# Patient Record
Sex: Female | Born: 1935 | Race: White | Hispanic: No | Smoking: Never smoker
Health system: Southern US, Community
[De-identification: ages and names within clinical notes are randomized; demographics above are authoritative.]

## PROBLEM LIST (undated history)

## (undated) DIAGNOSIS — M6281 Muscle weakness (generalized): Secondary | ICD-10-CM

## (undated) DIAGNOSIS — M26609 Unspecified temporomandibular joint disorder, unspecified side: Secondary | ICD-10-CM

## (undated) DIAGNOSIS — Z933 Colostomy status: Secondary | ICD-10-CM

## (undated) DIAGNOSIS — H01009 Unspecified blepharitis unspecified eye, unspecified eyelid: Secondary | ICD-10-CM

## (undated) DIAGNOSIS — N6019 Diffuse cystic mastopathy of unspecified breast: Secondary | ICD-10-CM

## (undated) DIAGNOSIS — IMO0002 Reserved for concepts with insufficient information to code with codable children: Secondary | ICD-10-CM

## (undated) DIAGNOSIS — I10 Essential (primary) hypertension: Secondary | ICD-10-CM

## (undated) DIAGNOSIS — K589 Irritable bowel syndrome without diarrhea: Secondary | ICD-10-CM

## (undated) DIAGNOSIS — F329 Major depressive disorder, single episode, unspecified: Secondary | ICD-10-CM

## (undated) DIAGNOSIS — D376 Neoplasm of uncertain behavior of liver, gallbladder and bile ducts: Secondary | ICD-10-CM

## (undated) DIAGNOSIS — R609 Edema, unspecified: Secondary | ICD-10-CM

## (undated) DIAGNOSIS — Z7901 Long term (current) use of anticoagulants: Secondary | ICD-10-CM

## (undated) DIAGNOSIS — E559 Vitamin D deficiency, unspecified: Secondary | ICD-10-CM

## (undated) DIAGNOSIS — D649 Anemia, unspecified: Secondary | ICD-10-CM

## (undated) DIAGNOSIS — N8110 Cystocele, unspecified: Secondary | ICD-10-CM

## (undated) DIAGNOSIS — K921 Melena: Secondary | ICD-10-CM

## (undated) DIAGNOSIS — R32 Unspecified urinary incontinence: Secondary | ICD-10-CM

## (undated) DIAGNOSIS — I2510 Atherosclerotic heart disease of native coronary artery without angina pectoris: Secondary | ICD-10-CM

## (undated) DIAGNOSIS — R259 Unspecified abnormal involuntary movements: Secondary | ICD-10-CM

## (undated) DIAGNOSIS — I80299 Phlebitis and thrombophlebitis of other deep vessels of unspecified lower extremity: Secondary | ICD-10-CM

## (undated) DIAGNOSIS — M1990 Unspecified osteoarthritis, unspecified site: Secondary | ICD-10-CM

## (undated) DIAGNOSIS — F419 Anxiety disorder, unspecified: Secondary | ICD-10-CM

## (undated) DIAGNOSIS — C801 Malignant (primary) neoplasm, unspecified: Secondary | ICD-10-CM

## (undated) DIAGNOSIS — H9190 Unspecified hearing loss, unspecified ear: Secondary | ICD-10-CM

## (undated) DIAGNOSIS — C189 Malignant neoplasm of colon, unspecified: Secondary | ICD-10-CM

## (undated) DIAGNOSIS — E876 Hypokalemia: Secondary | ICD-10-CM

## (undated) DIAGNOSIS — I2699 Other pulmonary embolism without acute cor pulmonale: Secondary | ICD-10-CM

## (undated) HISTORY — PX: CYSTECTOMY: SUR359

## (undated) HISTORY — DX: Irritable bowel syndrome, unspecified: K58.9

## (undated) HISTORY — DX: Malignant (primary) neoplasm, unspecified: C80.1

## (undated) HISTORY — DX: Reserved for concepts with insufficient information to code with codable children: IMO0002

## (undated) HISTORY — DX: Unspecified urinary incontinence: R32

## (undated) HISTORY — DX: Other pulmonary embolism without acute cor pulmonale: I26.99

## (undated) HISTORY — DX: Major depressive disorder, single episode, unspecified: F32.9

## (undated) HISTORY — DX: Phlebitis and thrombophlebitis of other deep vessels of unspecified lower extremity: I80.299

## (undated) HISTORY — DX: Muscle weakness (generalized): M62.81

## (undated) HISTORY — DX: Hypokalemia: E87.6

## (undated) HISTORY — DX: Unspecified blepharitis unspecified eye, unspecified eyelid: H01.009

## (undated) HISTORY — DX: Cystocele, unspecified: N81.10

## (undated) HISTORY — PX: HERNIA REPAIR: SHX51

## (undated) HISTORY — DX: Colostomy status: Z93.3

## (undated) HISTORY — DX: Unspecified abnormal involuntary movements: R25.9

## (undated) HISTORY — DX: Unspecified temporomandibular joint disorder, unspecified side: M26.609

## (undated) HISTORY — DX: Atherosclerotic heart disease of native coronary artery without angina pectoris: I25.10

## (undated) HISTORY — DX: Essential (primary) hypertension: I10

## (undated) HISTORY — DX: Vitamin D deficiency, unspecified: E55.9

## (undated) HISTORY — PX: SKIN CANCER EXCISION: SHX779

## (undated) HISTORY — PX: COLONOSCOPY: SHX174

## (undated) HISTORY — DX: Neoplasm of uncertain behavior of liver, gallbladder and bile ducts: D37.6

## (undated) HISTORY — PX: VAGINAL PROLAPSE REPAIR: SHX830

## (undated) HISTORY — PX: BACK SURGERY: SHX140

## (undated) HISTORY — PX: EYE SURGERY: SHX253

## (undated) HISTORY — DX: Anemia, unspecified: D64.9

## (undated) HISTORY — DX: Malignant neoplasm of colon, unspecified: C18.9

## (undated) HISTORY — PX: APPENDECTOMY: SHX54

## (undated) HISTORY — DX: Diffuse cystic mastopathy of unspecified breast: N60.19

## (undated) HISTORY — DX: Edema, unspecified: R60.9

## (undated) HISTORY — DX: Long term (current) use of anticoagulants: Z79.01

## (undated) HISTORY — DX: Unspecified hearing loss, unspecified ear: H91.90

## (undated) HISTORY — DX: Melena: K92.1

## (undated) HISTORY — DX: Unspecified osteoarthritis, unspecified site: M19.90

---

## 1976-09-07 HISTORY — PX: TOTAL ABDOMINAL HYSTERECTOMY: SHX209

## 1981-09-07 HISTORY — PX: NASAL RECONSTRUCTION WITH SEPTAL REPAIR: SHX5665

## 1999-10-22 ENCOUNTER — Encounter: Payer: Self-pay | Admitting: Gastroenterology

## 1999-10-22 ENCOUNTER — Encounter: Admission: RE | Admit: 1999-10-22 | Discharge: 1999-10-22 | Payer: Self-pay | Admitting: Gastroenterology

## 2000-11-24 ENCOUNTER — Encounter: Payer: Self-pay | Admitting: Gastroenterology

## 2000-11-24 ENCOUNTER — Encounter: Admission: RE | Admit: 2000-11-24 | Discharge: 2000-11-24 | Payer: Self-pay | Admitting: Gastroenterology

## 2001-12-07 ENCOUNTER — Encounter: Admission: RE | Admit: 2001-12-07 | Discharge: 2001-12-07 | Payer: Self-pay | Admitting: Gastroenterology

## 2001-12-07 ENCOUNTER — Encounter: Payer: Self-pay | Admitting: Gastroenterology

## 2003-01-17 ENCOUNTER — Encounter: Admission: RE | Admit: 2003-01-17 | Discharge: 2003-01-17 | Payer: Self-pay | Admitting: Gastroenterology

## 2003-01-17 ENCOUNTER — Encounter: Payer: Self-pay | Admitting: Gastroenterology

## 2004-01-12 ENCOUNTER — Encounter: Admission: RE | Admit: 2004-01-12 | Discharge: 2004-01-12 | Payer: Self-pay | Admitting: Gastroenterology

## 2004-03-05 ENCOUNTER — Encounter: Admission: RE | Admit: 2004-03-05 | Discharge: 2004-03-05 | Payer: Self-pay | Admitting: Gastroenterology

## 2004-10-21 ENCOUNTER — Encounter (INDEPENDENT_AMBULATORY_CARE_PROVIDER_SITE_OTHER): Payer: Self-pay | Admitting: Specialist

## 2004-10-21 ENCOUNTER — Inpatient Hospital Stay (HOSPITAL_COMMUNITY): Admission: RE | Admit: 2004-10-21 | Discharge: 2004-10-24 | Payer: Self-pay | Admitting: Obstetrics and Gynecology

## 2004-12-24 ENCOUNTER — Ambulatory Visit: Payer: Self-pay | Admitting: Internal Medicine

## 2005-01-23 ENCOUNTER — Encounter: Admission: RE | Admit: 2005-01-23 | Discharge: 2005-01-23 | Payer: Self-pay | Admitting: Gastroenterology

## 2005-05-19 ENCOUNTER — Encounter: Admission: RE | Admit: 2005-05-19 | Discharge: 2005-05-19 | Payer: Self-pay | Admitting: Gastroenterology

## 2005-09-07 HISTORY — PX: OTHER SURGICAL HISTORY: SHX169

## 2006-06-13 ENCOUNTER — Encounter: Admission: RE | Admit: 2006-06-13 | Discharge: 2006-06-13 | Payer: Self-pay | Admitting: Orthopedic Surgery

## 2006-06-23 ENCOUNTER — Encounter: Admission: RE | Admit: 2006-06-23 | Discharge: 2006-06-23 | Payer: Self-pay | Admitting: Gastroenterology

## 2007-07-27 ENCOUNTER — Encounter: Admission: RE | Admit: 2007-07-27 | Discharge: 2007-07-27 | Payer: Self-pay | Admitting: Gastroenterology

## 2008-03-23 ENCOUNTER — Encounter: Admission: RE | Admit: 2008-03-23 | Discharge: 2008-03-23 | Payer: Self-pay | Admitting: Gastroenterology

## 2008-04-04 ENCOUNTER — Encounter: Admission: RE | Admit: 2008-04-04 | Discharge: 2008-04-04 | Payer: Self-pay | Admitting: Gastroenterology

## 2008-04-16 ENCOUNTER — Encounter: Admission: RE | Admit: 2008-04-16 | Discharge: 2008-04-16 | Payer: Self-pay | Admitting: Neurosurgery

## 2008-04-19 ENCOUNTER — Encounter: Admission: RE | Admit: 2008-04-19 | Discharge: 2008-04-19 | Payer: Self-pay | Admitting: Neurosurgery

## 2008-05-07 ENCOUNTER — Encounter: Admission: RE | Admit: 2008-05-07 | Discharge: 2008-05-07 | Payer: Self-pay | Admitting: Radiology

## 2008-09-05 ENCOUNTER — Encounter: Admission: RE | Admit: 2008-09-05 | Discharge: 2008-09-05 | Payer: Self-pay | Admitting: Gastroenterology

## 2009-02-18 ENCOUNTER — Encounter: Admission: RE | Admit: 2009-02-18 | Discharge: 2009-02-18 | Payer: Self-pay | Admitting: Gastroenterology

## 2010-04-16 ENCOUNTER — Encounter: Admission: RE | Admit: 2010-04-16 | Discharge: 2010-06-18 | Payer: Self-pay | Admitting: Geriatric Medicine

## 2011-01-23 NOTE — Discharge Summary (Signed)
Erin Hansen, Erin Hansen                 ACCOUNT NO.:  [id number]   MEDICAL RECORD NO.:  [id number]          PATIENT TYPE:  INP   LOCATION:  9306                          FACILITY:  [facility]   PHYSICIAN:  Randye Lobo, M.D.   DATE OF BIRTH:  11-28-1925   DATE OF ADMISSION:  10/21/2004  DATE OF DISCHARGE:  10/24/2004                                 DISCHARGE SUMMARY   ADMISSION DIAGNOSES:  1.  Vaginal vault eversion.  2.  Stress and overflow incontinence.   DISCHARGE DIAGNOSES:  1.  Third degree cystocele.  2.  Second degree rectocele.  3.  Small enterocele.  4.  Genuine stress incontinence with overflow incontinence.  5.  Status post anterior and posterior colporrhaphy, enterocele repair,      tension-free vaginal tape, midurethral sling, and cystoscopy.  6.  Postoperative anemia.   HISTORY OF PRESENT ILLNESS:  The patient is a 77 year old gravida 1, para 1-  0-0-0, Caucasian female, status post total abdominal hysterectomy with  bilateral salpingo-oophorectomy and status post vaginal prolapse repair, who  presented with recurrent vaginal protrusion and urinary incontinence which  was not ameliorated with pessary use.  The patient reported difficulty with  urinary retention along with episodes of loss of urine which limited her  lifestyle significantly.  The patient wished for surgical treatment of the  prolapse and incontinence.   PAST MEDICAL HISTORY:  Significant for:  1.  Mild degenerative joint disease in the neck and left shoulder.  2.  Nocturnal palpitations.  3.  Chronic fatigue.  4.  Intermittent labyrinthitis.  5.  Intermittent bronchitis.  6.  Chronic blepharitis.  7.  Irritable bowel syndrome with constipation and episodes of abdominal      pain.  8.  Chronic muscle contraction headaches.  9.  Hypercystic change of the breasts.  10. Chronic right carpal tunnel syndrome.  11. Intermittent left temporomandibular joint symptoms.  12. Mild resting hand tremor.  13.  Intermittent hoarseness.  14. Diminished memory thought to be probable stress reaction.  15. History of cellulitis.  16. Bilateral tinnitus.  17. History of pain in the left thigh.   PAST SURGICAL HISTORY:  1.  Status post total abdominal hysterectomy with bilateral salpingo-      oophorectomy in 1978.  2.  Status post vaginal prolapse repair in 1990.  3.  Status post herniorrhaphy.  4.  Status post deviated septum surgical repair.   MEDICATIONS:  1.  Lactulose four tablespoons one time per day.  2.  Erythromycin.  3.  Desensitization injections.   ALLERGIES:  CODEINE.   SOCIAL HISTORY:  The patient is married.  She lives at [facility].  Her husband has recently had a car accident and he fractured his sternum.  The patient denies the use of tobacco, alcohol, or drugs that are not  prescribed to her.   PHYSICAL EXAMINATION:  HEART:  S1 and S2 with a regular rate and rhythm and  no evidence of murmurs, rubs, or gallops.  LUNGS:  Clear to auscultation bilaterally.  ABDOMEN:  There is a well-healed Pfannenstiel incision without  evidence of  herniation.  The abdomen is soft and nontender and without evidence of  hepatosplenomegaly or organomegaly.  PELVIC:  Atrophic external genitalia.  Normal urethra.  Evidence of what  appeared to be complete vaginal vault eversion with dried keratotic  epithelium.  The cervix was absent and there was no evidence of any midline  nor adnexal masses.   HOSPITAL COURSE:  The patient was admitted on October 21, 2004, at which  time she underwent a large anterior colporrhaphy with a posterior  colporrhaphy, enterocele repair, tension-free vaginal tape procedure, and  cystoscopy while under general anesthesia.  The patient's surgery was  uncomplicated, but she was noted to have an estimated blood loss of 600 mL.  The patient did have cystoscopy performed during the procedure documenting  the ureters to be patent bilaterally with no evidence  of sutures or foreign  body in the bladder or urethra.   POSTOPERATIVE COURSE:  The patient had really an unremarkable postoperative  recovery.  She initially had a morphine PCA to treat her pain.  She did  develop some nausea overnight following her surgery and eventually she was  converted over to oral Percocet and ibuprofen which have controlled her pain  well.  The patient on postoperative day #1 had minimal ambulation to the  bathroom and back, but by postoperative day #2, she was able to ambulate in  the corridor with assistance and the use of her cane.   The patient's diet was slowly advanced to regular.  She is able to keep down  solid food and liquids at the time of her discharge.   The patient did have bladder training on postoperative day #1, but was not  able to void spontaneously with two attempts and her Foley catheter was  therefore reinserted and left to gravity drainage with a support Velcro band  on her leg to insure that the catheter was not pulling on the urethra and  the surgical repair.   The patient wore PAS stockings and TED hose throughout her hospitalization  for DVT prophylaxis.   With respect to the patient's incision sites, her two suprapubic incision  sites remained intact.  There is some dried blood noted on the incisions and  there is a small amount of ecchymotic change in this region.  She does have  Dermabond on her skin, so the skin does appear to be shiny.  Examination of  the vulva does demonstrate significant ecchymosis and some edema in this  region.  There is no palpable hematoma and this has remained stable  throughout the hospital.   The patient did have a diagnosis of postoperative anemia.  Her hemoglobin at  the time of her discharge is 8.2 and she is tolerating this well without  episodes of shortness of breath, palpitations, chest pain, tachycardia, or dizziness.  The patient has been started on iron sulfate to treat this.   The  patient was found to be in good condition and ready for discharge on  October 24, 2004.  Due to her catheterization and her need for assistance  for ambulation, she will be discharged to [facility] skilled nursing  care initially.   DISCHARGE MEDICATIONS:  1.  Percocet 5 mg/325 mg one p.o. q.4h p.r.n. pain.  2.  Ibuprofen 600 mg p.o. q.6h p.r.n. pain.  3.  Iron sulfate 325 mg p.o. t.i.d. with meals.  4.  Macrobid 100 mg p.o. daily for prophylaxis while her catheter remains in  place.  5.  The patient may take Lactulose four tablespoons p.o. daily.  6.  Metamucil one teaspoon p.o. b.i.d.  7.  She may resume the use of her Erythromycin for her blepharitis as      needed.   DIET:  The patient will follow a regular diet.   WOUND CARE:  The patient will use soap and water to take care of her  surgical sites.  She is to place nothing in the vagina.   DISCHARGE INSTRUCTIONS:  The patient will ambulate several times per day and  sit in a chair at least two to three times per day during her surgical  recovery.  The patient will follow up in the office in one week for a trial  of bladder training.  Her Foley catheter is to be removed two hours before  she arrives to the office for her appointment time.  The patient will call  the office if she experiences any problems with fever, increased vaginal  bleeding, pain uncontrolled by her medications, nausea and vomiting,  dizziness, lightheadedness, or headache indicating intolerance to her  anemia.      BES/MEDQ  D:  10/24/2004  T:  10/24/2004  Job:  [id number]

## 2011-01-23 NOTE — H&P (Signed)
NAMESHEQUITA, PEPLINSKI NO.:  [id number]   MEDICAL RECORD NO.:  [id number]          PATIENT TYPE:  INP   LOCATION:  NA                            [facility]AC[facility]:  [facility]   PHYSICIAN:  Randye Lobo, M.D.   DATE OF BIRTH:  1925-11-27   DATE OF ADMISSION:  DATE OF DISCHARGE:                                HISTORY & PHYSICAL   CHIEF COMPLAINT:  Vaginal protrusion and loss of bladder control.   The patient is a 77 year old gravida 1, para 1-0-0-0 Caucasian female,  status post total abdominal hysterectomy with bilateral salpingo-  oophorectomy for uterine fibroids in 1978 and status post a vaginal prolapse  repair in 1990, who presents now with recurrent vaginal protrusion which has  not been satisfactorily treated with pessary use.  The patient has reported  the use of several different pessaries which have not been able to control  her prolapse.  The patient also reports periods of urinary retention  alternating with loss of control which has significantly limited her  physical and social activity.  The patient would like surgical treatment of  the protrusion and of the urinary incontinence.   PAST OBSTETRIC AND GYNECOLOGIC HISTORY:  1.  Vaginal delivery.  2.  She is status post total abdominal hysterectomy with bilateral salpingo-      oophorectomy for fibroids.  3.  She is status post a vaginal prolapse repair in 1990.  The patient has      been using Premarin 0.625 hormone therapy which was discontinued for her      surgery.  The patient has been using vaginal estrogen cream.  4.  The patient's last Pap smear was performed in December 2005 and was      within normal limits, and her last mammogram was performed in October      2005 and was also within normal limits.   PAST MEDICAL HISTORY:  1.  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  2.  Nocturnal palpitations.  3.  Chronic fatigue.  4.  Intermittent labyrinthitis.  5.  Intermittent  bronchitis.  6.  Chronic blepharitis.  7.  Irritable bowel syndrome with constipation and episodes of abdominal      pain.  8.  Chronic muscle contraction headaches.  9.  Fibrocystic change of the breasts.  10. Chronic right carpal tunnel syndrome.  11. Intermittent left TMJ symptoms.  12. Mild resting head tremor.  13. Intermittent hoarseness.  14. Diminished memory thought to be a probable stress reaction.  15. History of cellulitis.  16. Bilateral tinnitus.  17. History of pain in the left thigh.   PAST SURGICAL HISTORY:  1.  Status post total abdominal hysterectomy with bilateral salpingo-      oophorectomy in 1978.  2.  Status post vaginal prolapse repair in 1990.  3.  Status post herniorrhaphy.  4.  Status post surgical treatment of a deviated septum.   MEDICATIONS:  1.  Celebrex 200 mg p.o. p.r.n.  2.  Premarin 0.625 mg p.o. daily (discontinued).  3.  Lactulose 60 mL b.i.d.  4.  Erythromycin ointment.  5.  Desensitization shots.   ALLERGIES:  CODEINE.   SOCIAL HISTORY:  The patient is married.  She lives at [facility].  Her husband has recently had a car accident and fractured his sternum.  The  patient denies the use of tobacco, alcohol, or drugs that are not prescribed  to her.   FAMILY HISTORY:  Negative for heart disease, hypertension, high cholesterol,  stroke, and diabetes.  Negative for breast, uterine, colon, and ovarian  cancer.   PHYSICAL EXAMINATION:  GENERAL:  The patient is an elderly Caucasian female  in no acute distress.  HEART:  S1, S2 with a regular rate and rhythm.  No evidence of a murmur,  rub, or gallop.  ABDOMEN:  There is evidence of a well-healed Pfannenstiel incision without  evidence of herniation.  The abdomen is soft and nontender and without  evidence of hepatosplenomegaly or organomegaly.  PELVIC EXAM:  Atrophic external genitalia.  Normal urethra.  The patient has  evidence of complete vaginal vault eversion with evidence  of dried keratotic-  appearing epithelium.  The cervix is noted to be absent.  There is no  evidence of any midline nor adnexal masses appreciated.   IMPRESSION:  The patient is a 77 year old female, status post total  abdominal hysterectomy with bilateral salpingo-oophorectomy and status post  vaginal prolapse repair, who presents now with recurrent prolapse and  evidence of complete vaginal vault eversion and a probable large enterocele.  The patient has a significant problem with urinary incontinence which is  probably a combination of both stress incontinence and overflow  incontinence.   PLAN:  The patient will undergo an anterior and posterior colporrhaphy, TVT  with cystoscopy, enterocele repair, and iliococcygeus vaginal vault  suspension at the [facility] on October 21, 2004.  Risks, benefits, and alternatives have been discussed with the patient, who  wishes to proceed.      BES/MEDQ  D:  10/20/2004  T:  10/20/2004  Job:  [id number]

## 2011-01-23 NOTE — Op Note (Signed)
Erin Hansen, Erin Hansen NO.:  [id number]   MEDICAL RECORD NO.:  [id number]          PATIENT TYPE:  INP   LOCATION:  9399                          FACILITY:  [facility]   PHYSICIAN:  Randye Lobo, M.D.   DATE OF BIRTH:  August 31, 1926   DATE OF PROCEDURE:  10/21/2004  DATE OF DISCHARGE:                                 OPERATIVE REPORT   PREOPERATIVE DIAGNOSES:  1.  Vaginal vault eversion.  2.  Overflow and stress incontinence.   POSTOPERATIVE DIAGNOSIS:  1.  Third degree cystocele.  2.  Second degree rectocele.  3.  Overflow and stress incontinence.   PROCEDURE:  Anterior and posterior colporrhaphy, enterocele repair, TVT,  cystoscopy.   SURGEON:  Randye Lobo, M.D.   ASSISTANT:  Luvenia Redden, M.D.   ANESTHESIA:  General endotracheal.   IV FLUIDS:  3100 mL Ringer's lactate.   ESTIMATED BLOOD LOSS:  600 mL.   URINE OUTPUT:  750 mL.   COMPLICATIONS:  None.   INDICATIONS FOR PROCEDURE:  The patient is a 77 year old gravida one, para 1-  0-0-0 Caucasian female, status post total abdominal hysterectomy with  bilateral salpingo-oophorectomy and status post prolapse repair in 1990 who  presented with recurrent vaginal protrusion and urinary incontinence  alternating with urinary retention. The patient has been cared for by Dr.  Elana Alm who has attempted the use of pessaries which have not satisfactorily  reduced the prolapse. The patient presented to me for further evaluation and  treatment and I also tried varying types of pessaries along with vaginal  estrogen cream which did not ameliorate the patient's symptoms. The patient  had an obvious large prolapse which was thought to the vaginal vault  eversion. There was obvious hypermobility of the urethra. An attempt was  made to perform urodynamics in the office, however it was difficult to  reduce the prolapse adequately to even perform this study. The patient did  have leakage of urine with the pessary in  place and she also reported  difficulty at times with urinary retention which she could somewhat relieve  by placing pressure vaginally on her bladder. The patient wished for  surgical treatment of both the vaginal prolapse and the urinary incontinence  and a plan was made to proceed with an anterior and posterior colporrhaphy  with an enterocele repair and a tension-free vaginal tape with  cystoscopy  after risks and benefits were reviewed.   FINDINGS:  Examination under anesthesia revealed an extremely large  cystocele with actually good support of the vaginal apex. There was evidence  of a second degree rectocele. The urethra was noted to have prolapse and  there was hypermobility of the urethra appreciated. There was an enterocele  appreciated at the inferior aspect of the bladder.   Cystoscopy demonstrated the absence of a foreign body in the bladder or in  the urethra after both passing the transabdominal needle passers and after  placing the sling itself. There were no lesions noted in the bladder which  was visualized throughout 360 degrees. The bladder had a normal trigone and  bladder dome. The ureters were patent bilaterally after the injection of  indigo carmine dye. The urethra was noted to be  short.   Rectal examination both during and after completion of the posterior  colporrhaphy demonstrated the absence of sutures in the rectum.   SPECIMENS:  The enterocele sac was sent to pathology.   PROCEDURE:  The patient was reidentified in the preoperative hold area. The  patient did receive Ancef 1 g intravenously for antibiotic prophylaxis. She  had both TED hose and PAS stockings for DVT prophylaxis.   The patient had a doughnut pessary which was in place and was removed. The  suprapubic region and the vagina and perineum were then sterilely prepped  and draped.   A Foley catheter was placed inside the bladder. The patient was then  examined under anesthesia and the  findings are as noted above. Initially two  figure-of-eight sutures of #0 Vicryl were placed at what was thought to be  the vaginal apex bilaterally.  However, with further evaluation, the  significant vaginal prolapse was noted to be essentially all cystocele. The  midline of the vaginal mucosa was marked with Allis clamps and the mucosa  was then injected with 0.5% lidocaine with 1:200,000 of epinephrine. The  vaginal mucosa was then incised vertically in the midline with a scalpel and  with a Mayo scissors. The subvaginal tissue was dissected off of the  overlying vaginal epithelium bilaterally. Small bleeding vessels were  cauterized with monopolar cautery. There was some bleeding vessels noted  over the bladder bilaterally and these responded well to both monopolar  cautery and figure-of-eight sutures of 2-0 Vicryl. The dissection was  carried back to the level of the pubic rami bilaterally.   At this time further evaluation of the enterocele was performed. The  enterocele sac was entered sharply during the anterior vaginal dissection.  The cul-de-sac was examined. There were no bowel adhesions to the  peritoneum. The enterocele sac was mark was marked with hemostat clamps, and  the bladder was then dissected off of the peritoneum. The peritoneum was  noted to be very close to the vaginal epithelium at the most inferior aspect  of the vaginal mucosal incision. A decision was therefore made to close the  enterocele sac with 2-0 Vicryl absorbable suture. The excess enterocele sac  was then sharply excised.   The TVT was performed at this time. The suprapubic incision sites were  marked approximately 2 cm to the right and left to the midline. The TVT  procedure was performed in a top down  method. The needle was placed through  the right suprapubic incision and out through the endopelvic fascia to the right of the urethra and at approximately the level of the mid urethra. The  same  procedure that was performed on the right-hand side was then repeated  on the left with that abdominal passer.   The Foley catheter was removed at this time and cystoscopy was performed and  the findings were as noted above. The TVT sling was then attached to the  abdominal needle passers and was drawn up through the suprapubic incisions  bilaterally. This was performed with the bladder empty. Final cystoscopy was  performed again and the findings are as noted above. The plastic sheath was  removed at this time and the TVT was set in a tension free position. The  excess mesh was trimmed suprapubically. There was some bleeding noted along  the exit site of the abdominal  needle passer at the location on the right  hand side where the mesh came through and this was made hemostatic with  figure-of-eight sutures of 2-0 Vicryl.   Hemostasis was excellent at this time and the anterior colporrhaphy was next  performed by placing a series of vertical mattress sutures of #0 Vicryl.  This adequately reduced the cystocele. The excess vaginal mucosa was then  trimmed and the anterior vaginal wall was closed with a running locked  suture of 2-0 Vicryl.   The posterior colporrhaphy was performed next. Allis clamps were used to  mark the midline of the posterior vagina and the vaginal mucosa was injected  with 0.5% lidocaine with 1:200,000 of epinephrine. A triangular wedge of  epithelium was removed from the perineal body. The vagina was then incised  vertically in the midline with a Mayo scissors. The perirectal fascia was  dissected off of the overlying vaginal epithelium bilaterally. The rectocele  was then reduced by placing a series of both horizontal and then vertical  mattress sutures of #0 Vicryl across the perirectal fascia and levator  muscles. The excess vaginal mucosa was then trimmed and the posterior vagina  was closed with 2-0 Vicryl. The 2-0 Vicryl was used in a running locked  fashion  along the vaginal mucosa to the level of the hymen. A series of 2  crown sutures of #0 Vicryl were placed to provide support to the perineal  body. The remainder of the posterior vaginal closure was performed with the  2-0 Vicryl as for an episiotomy and the knot and the knot was tied inside  the hymen.   Final rectal examination confirmed the absence of any sutures in the rectum.  There was some bleeding noted in a buttonhole of the mucosa of the vagina on  the patient's left-hand side above the hymenal ring. A figure-of-eight  suture of 2-0 Vicryl was placed in this area for excellent hemostasis. A  vaginal packing with Estrace cream was placed inside the vagina.   The suprapubic incisions were closed with Dermabond.   The Foley catheter was left to gravity drainage. The patient was cleansed  with Betadine. She was taken out of the dorsal lithotomy position, awakened,  and extubated.  There were no complications to the procedure. All needle, instrument, and  sponge counts were correct.      BES/MEDQ  D:  10/21/2004  T:  10/21/2004  Job:  [id number]   cc:   S. Kyra Manges, M.D.  [phone] [address]  [place]  [place] [address]  Fax: [phone]

## 2012-08-22 ENCOUNTER — Other Ambulatory Visit: Payer: Self-pay | Admitting: Gastroenterology

## 2012-08-23 ENCOUNTER — Other Ambulatory Visit: Payer: Self-pay | Admitting: Gastroenterology

## 2012-08-23 DIAGNOSIS — C189 Malignant neoplasm of colon, unspecified: Secondary | ICD-10-CM

## 2012-08-26 ENCOUNTER — Ambulatory Visit
Admission: RE | Admit: 2012-08-26 | Discharge: 2012-08-26 | Disposition: A | Discharge status: Home / Self Care | Payer: Medicare Other | Source: Ambulatory Visit | Attending: Gastroenterology | Admitting: Gastroenterology

## 2012-08-26 DIAGNOSIS — C189 Malignant neoplasm of colon, unspecified: Secondary | ICD-10-CM

## 2012-08-26 MED ORDER — IOHEXOL 300 MG/ML  SOLN
100.0000 mL | Freq: Once | INTRAMUSCULAR | Status: AC | PRN
  Administered 2012-08-26: 100 mL via INTRAVENOUS

## 2012-09-06 ENCOUNTER — Encounter (INDEPENDENT_AMBULATORY_CARE_PROVIDER_SITE_OTHER): Payer: Self-pay | Admitting: General Surgery

## 2012-09-09 ENCOUNTER — Encounter (INDEPENDENT_AMBULATORY_CARE_PROVIDER_SITE_OTHER): Payer: Self-pay | Admitting: General Surgery

## 2012-09-09 ENCOUNTER — Ambulatory Visit (INDEPENDENT_AMBULATORY_CARE_PROVIDER_SITE_OTHER): Payer: Medicare Other | Admitting: General Surgery

## 2012-09-09 VITALS — BP 138/70 | HR 76 | Temp 97.8°F | Resp 18 | Ht 67.0 in | Wt 152.1 lb

## 2012-09-09 DIAGNOSIS — C189 Malignant neoplasm of colon, unspecified: Secondary | ICD-10-CM | POA: Insufficient documentation

## 2012-09-09 NOTE — Progress Notes (Signed)
Patient ID: Erin Hansen, female   DOB: 05/08/1926, 79 y.o.   MRN: [id number]  Chief Complaint  Patient presents with  . Colon Cancer    Sigmoid    HPI Erin Hansen is a 79 y.o. female.  Referred by Dr. Outlaw  HPI This is an 79-year-old female with a history of some irritable bowel syndrome. Since August she has noticed some generalized abdominal discomfort as well as some weakness. She had had diarrhea in August she treated with Imodium. She also noted that she was nauseated and that she was sleeping a lot more. About 2 months ago she also noted some hematochezia. She was a evaluated by her primary care physician and then was referred to see gastroenterology. She also describes leaking some blood and mucus. She is wearing a diaper now due to the fact that she has considerable leakage and is not really able to control her bowel movements or leakage of blood or mucus from her anus right now. This is keeping her basically in her room at her assisted living facility at [facility]. She prior to this is a very active lady who drove herself to her ct scan recently.She has used lactulose for about 20 years and over the last week she did use some lactulose with a return of a bowel movement. She is not having normal bowel movements at all right now although it does complain of some increased bloating and abdominal issues. She has lost 15 pounds since all this began but she described no change in her appetite. She was evaluated by Dr. Outlaw and underwent a colonoscopy. The colonoscopy showed a few medium-sized diverticuli in the sigmoid colon and the descending colon. There was a partially obstructing ulcerated mass found in the sigmoid colon. This involved about two thirds of the lumen. This was injected with India ink. A pediatric and an upper endoscope were both used to try to reach the cecum unsuccessfully. Dr. Outlaw was able to get about to the mid transverse colon and was able to traverse the lesion.  She underwent a biopsy of this area which shows this to be positive for adenocarcinoma. Since then she has undergone a CT of her chest which shows no evidence of any metastatic disease and history of her T12 compression fracture. A CT of her abdomen and pelvis shows a lobular soft tissue mass in the rectosigmoid region measuring 5.6 x 3 x 3.8 cm with no definitive evidence of local extension or adenopathy. There is a moderate amount of feces proximal to this as well. There is also an 11 mm low-attenuation structure in the mid right lobe of the liver that does not appear to be a cyst as well as a small soft tissue nodule in the right upper quadrant posterior to the liver which could potentially be a peritoneal implant. She comes in today to discuss her options.  Past Medical History  Diagnosis Date  . DJD (degenerative joint disease)   . Compression fracture     t12  . IBS (irritable bowel syndrome)   . Vaginal prolapse   . TMJ (temporomandibular joint disorder)   . Anemia   . Cancer     skin  . Hearing loss   . Cough   . Constipation   . Weakness   . Rectal bleeding   . Blood in stool     Past Surgical History  Procedure Date  . Appendectomy   . Total abdominal hysterectomy   . Vaginal prolapse   repair 2005 - approximate  . Cystectomy     back  . Pelvic fracture surgery     reconstruction  . Nasal reconstruction with septal repair   . Skin cancer excision   . Hernia repair 1950 - approximate    History reviewed. No pertinent family history.  Social History History  Substance Use Topics  . Smoking status: Never Smoker   . Smokeless tobacco: Not on file  . Alcohol Use: No    Allergies  Allergen Reactions  . Codeine Nausea Only    Current Outpatient Prescriptions  Medication Sig Dispense Refill  . acetaminophen (TYLENOL) 500 MG tablet Take 500 mg by mouth every 6 (six) hours as needed.      . calcium carbonate (TUMS - DOSED IN MG ELEMENTAL CALCIUM) 500 MG chewable  tablet Chew 1 tablet by mouth daily.      . cholecalciferol (VITAMIN D) 1000 UNITS tablet Take 1,000 Units by mouth daily.      . docusate sodium (COLACE) 100 MG capsule Take 100 mg by mouth as needed.       . ferrous sulfate 325 (65 FE) MG tablet Take 325 mg by mouth daily with breakfast.      . hydrochlorothiazide (HYDRODIURIL) 25 MG tablet Take 25 mg by mouth daily.      . lactulose (CEPHULAC) 10 G packet Take 10 g by mouth 3 (three) times daily.      . psyllium (METAMUCIL) 58.6 % powder Take 1 packet by mouth 3 (three) times daily.      . pyridOXINE (VITAMIN B-6) 100 MG tablet Take 100 mg by mouth daily.      . sertraline (ZOLOFT) 25 MG tablet Take 25 mg by mouth daily. Patient takes 1/2 tablet per dosage.      . vitamin C (ASCORBIC ACID) 500 MG tablet Take 500 mg by mouth daily.        Review of Systems Review of Systems  Constitutional: Negative for fever, chills and unexpected weight change.  HENT: Positive for hearing loss. Negative for congestion, sore throat, trouble swallowing and voice change.   Eyes: Negative for visual disturbance.  Respiratory: Positive for cough. Negative for wheezing.   Cardiovascular: Negative for chest pain, palpitations and leg swelling.  Gastrointestinal: Positive for constipation and blood in stool. Negative for nausea, vomiting, abdominal pain, diarrhea, abdominal distention and anal bleeding.  Genitourinary: Negative for hematuria, vaginal bleeding and difficulty urinating.  Musculoskeletal: Negative for arthralgias.  Skin: Negative for rash and wound.  Neurological: Positive for weakness. Negative for seizures, syncope and headaches.  Hematological: Negative for adenopathy. Does not bruise/bleed easily.  Psychiatric/Behavioral: Negative for confusion.    Blood pressure 138/70, pulse 76, temperature 97.8 F (36.6 C), temperature source Temporal, resp. rate 18, height 5' 7" (1.702 m), weight 152 lb 2 oz (69.003 kg).  Physical Exam Physical Exam    Vitals reviewed. Constitutional: She appears well-developed and well-nourished.  Eyes: No scleral icterus.  Neck: Neck supple.  Cardiovascular: Normal rate, regular rhythm and normal heart sounds.   Pulmonary/Chest: Effort normal and breath sounds normal. She has no wheezes. She has no rales.  Abdominal: Soft. Bowel sounds are normal. There is no tenderness. No hernia. Hernia confirmed negative in the ventral area, confirmed negative in the right inguinal area and confirmed negative in the left inguinal area.    Genitourinary: Rectal exam shows external hemorrhoid and tenderness. Guaiac positive stool (gross blood seen).  Lymphadenopathy:    She has no cervical adenopathy.      Data Reviewed CT CHEST, ABDOMEN AND PELVIS WITH CONTRAST  Technique: Multidetector CT imaging of the chest, abdomen and  pelvis was performed following the standard protocol during bolus  administration of intravenous contrast.  Contrast: 100mL OMNIPAQUE IOHEXOL 300 MG/ML SOLN,  Comparison: None.  CT CHEST  Findings: On the lung window images, no lung metastasis are seen.  No infiltrate or effusion is noted.  On soft tissue window images, the thyroid gland is somewhat  asymmetric right lobe larger than left but no suspicious nodule or  mass is seen. The thoracic aorta opacifies with no acute  abnormality and the origins of the great vessels are patent. The  pulmonary arteries opacify with no acute abnormality noted. There  is mild cardiomegaly noted. A small hiatal hernia is present. No  acute bony abnormality is seen and no lytic or blastic lesion is  noted throughout the thoracic spine. There is a compression  deformity of T12 vertebral body with apparent vertebroplasty at  that level and some retropulsion.  IMPRESSION:  1. No evidence of metastatic involvement of the lungs.  2. Small hiatal hernia.  3. Compressed T12 vertebral body with vertebroplasty and  retropulsion at that level.  CT ABDOMEN AND  PELVIS  Findings: Within the right lobe of liver laterally there is an 11  mm low attenuation poorly defined structure present. This is not a  cyst and this area appears to persist on delayed images. A hepatic  metastatic lesion cannot be excluded. A tiny sub centimeter  structure is noted in the anterior aspect of the medial segment of  the left lobe of questionable significance. No ductal dilatation  is seen. No calcified gallstones are noted. Within the right upper  quadrant posterior to the liver and abutting the hemidiaphragm,  there is a 6 x 12 mm soft tissue nodule present. A peritoneal  metastatic implant cannot be excluded. The pancreas is normal in  size and the pancreatic duct is not dilated. The adrenal glands  and spleen are unremarkable although there are several calcified  splenic granulomas present most likely due to prior granulomatous  disease. The stomach is fluid distended with no abnormality noted.  The kidneys enhance and there do appear to be small nonobstructing  left renal calculi present. The abdominal aorta is normal in  caliber with moderate atheromatous change present. No adenopathy  is seen.  The urinary bladder is unremarkable. The uterus appears to have  previously been resected. No adnexal lesion is seen.  However, within the left pelvis there is an abnormal soft tissue  mass which appears to involve the rectosigmoid colon, measuring  approximately 5.6 x 3.0 x 3.8 cm, consistent with the recently  diagnosed carcinoma. No definite evidence of local extension of  tumor is seen. No adenopathy is noted. There is a moderate amount  of feces throughout the more proximal colon. The terminal ileum is  unremarkable. Diffuse degenerative disc disease is present  throughout the lumbar spine.  IMPRESSION:  1. Lobular soft tissue mass in the rectosigmoid colon consistent  with the recently diagnosed rectosigmoid colon carcinoma. No  definite evidence of local  extension of tumor is seen and no  adenopathy is noted.  2. 11 mm low attenuation structure in the mid right lobe of liver.  Cannot exclude a single liver metastasis. A tiny sub centimeter  low attenuation structure in the left lobe is also noted of  uncertain significance.  3. Small soft tissue nodule in the right upper quadrant posterior    to the liver. Cannot exclude peritoneal implant of tumor.  Original Report Authenticated By: Paul Barry, M.D.   Assessment    Colon cancer    Plan    We spent about 45 minutes discussing her new diagnosis. She does have a symptomatic sigmoid colon cancer. She does have bleeding and I think it is causing her some obstructive symptoms. I am concerned that she also has stage IV disease given her CT scan as well as some of her symptoms. I discussed with her proceeding with a biopsy of the liver lesion by interventional radiology and an evaluation by medical oncology next week. I think she will still need surgery and I discussed with her a laparoscopic possible open sigmoid colectomy. I think due to the size of her tumor she is going to have a incision regardless just to remove this. We discussed for a prolonged time the impact this will have on her current health status as well as her living status. I don't know that she is going to return to her functional level after this all either. Am also concerned with her ability to control her bowel movements and her leakage she currently has. When I examined her today to do a rectal exam she has some significant external hemorrhoids as well as a diaper that was full of stool, blood, and mucus. She right now cannot really leave her room [facility] due to that consistent leakage she has. I told her that I think the best option for her given her diagnosis as well as her quality of life afterwards would be a colostomy. I we had a long discussion about her ability to do things in participate outside of her room with a  colostomy. She initially was concerned about this but after long discussion at the she is agreeable to this. I will plan on a colectomy with a colostomy in 2 weeks after the other 2 things have been done. I will plan on seeing her again later next week prior to proceeding with surgery I discussed this with she and her friend who was present and is a nurse. He seems satisfied with this and will plan to see her next week prior to doing surgery. We had a long discussion about the risks of surgery including bleeding, infection, open wound, hernia, DVT, PE, cardiac complications, abscess, reoperation, as well as the long-term effects this is have       Erin Hansen 09/09/2012, 3:35 PM    

## 2012-09-12 ENCOUNTER — Encounter (HOSPITAL_COMMUNITY): Payer: Self-pay | Admitting: Pharmacy Technician

## 2012-09-12 ENCOUNTER — Telehealth (INDEPENDENT_AMBULATORY_CARE_PROVIDER_SITE_OTHER): Payer: Self-pay

## 2012-09-12 ENCOUNTER — Telehealth: Payer: Self-pay | Admitting: *Deleted

## 2012-09-12 DIAGNOSIS — K6389 Other specified diseases of intestine: Secondary | ICD-10-CM

## 2012-09-12 NOTE — Telephone Encounter (Signed)
Called Radiology to check on status of the CT bx for the liver mets and the order from Friday had been canceled. I spoke to Dr Dwain Sarna and he did not cancel the order so I did another order for the bx in epic.

## 2012-09-12 NOTE — Telephone Encounter (Signed)
Called Helmut Muster to let her know the order has been changed in epic.

## 2012-09-12 NOTE — Telephone Encounter (Signed)
Spoke with patient by phone and explained role of nurse navigator.  Appointment given to patient for 09/16/12 with Dr. Truett Perna.  She confirmed appointment date and time.  Contact names and phone numbers were given to patient for this RN and Dr. Truett Perna.  She verbalized comprehension.

## 2012-09-12 NOTE — Telephone Encounter (Signed)
[facility] Radiologists calling wanting to switch the order in epic from CT Bx to a U/s Bx on the liver met per their request. I changed the order.

## 2012-09-13 ENCOUNTER — Other Ambulatory Visit: Payer: Self-pay | Admitting: Radiology

## 2012-09-13 ENCOUNTER — Encounter (HOSPITAL_COMMUNITY): Payer: Self-pay | Admitting: Pharmacy Technician

## 2012-09-13 ENCOUNTER — Other Ambulatory Visit (INDEPENDENT_AMBULATORY_CARE_PROVIDER_SITE_OTHER): Payer: Self-pay | Admitting: General Surgery

## 2012-09-13 ENCOUNTER — Telehealth: Payer: Self-pay | Admitting: Oncology

## 2012-09-13 ENCOUNTER — Encounter (INDEPENDENT_AMBULATORY_CARE_PROVIDER_SITE_OTHER): Payer: Self-pay

## 2012-09-13 NOTE — Progress Notes (Signed)
Dr. Dwain Sarna,          Erin Hansen has preop appt at [facility] Thursday  1/9  At 11:00 am - please enter preop orders in Epic.                     Thanks!

## 2012-09-13 NOTE — Telephone Encounter (Signed)
Welcome packet mailed w/calendar.  °

## 2012-09-14 ENCOUNTER — Ambulatory Visit (HOSPITAL_COMMUNITY)
Admission: RE | Admit: 2012-09-14 | Discharge: 2012-09-14 | Disposition: A | Discharge status: Home / Self Care | Payer: Medicare Other | Source: Ambulatory Visit | Attending: General Surgery | Admitting: General Surgery

## 2012-09-14 ENCOUNTER — Encounter (HOSPITAL_COMMUNITY): Payer: Self-pay

## 2012-09-14 DIAGNOSIS — K7689 Other specified diseases of liver: Secondary | ICD-10-CM | POA: Insufficient documentation

## 2012-09-14 DIAGNOSIS — K6389 Other specified diseases of intestine: Secondary | ICD-10-CM

## 2012-09-14 LAB — CBC
HCT: 32.6 % — ABNORMAL LOW (ref 36.0–46.0)
MCV: 87.2 fL (ref 78.0–100.0)
Platelets: 299 10*3/uL (ref 150–400)
RBC: 3.74 MIL/uL — ABNORMAL LOW (ref 3.87–5.11)
WBC: 8 10*3/uL (ref 4.0–10.5)

## 2012-09-14 MED ORDER — MIDAZOLAM HCL 2 MG/2ML IJ SOLN
INTRAMUSCULAR | Status: AC
  Filled 2012-09-14: qty 4

## 2012-09-14 MED ORDER — FENTANYL CITRATE 0.05 MG/ML IJ SOLN
INTRAMUSCULAR | Status: AC
  Filled 2012-09-14: qty 4

## 2012-09-14 MED ORDER — FENTANYL CITRATE 0.05 MG/ML IJ SOLN
INTRAMUSCULAR | Status: AC | PRN
  Administered 2012-09-14: 50 ug via INTRAVENOUS

## 2012-09-14 MED ORDER — SODIUM CHLORIDE 0.9 % IV SOLN: Freq: Once | INTRAVENOUS | Status: DC

## 2012-09-14 MED ORDER — MIDAZOLAM HCL 2 MG/2ML IJ SOLN
INTRAMUSCULAR | Status: AC | PRN
  Administered 2012-09-14: 1 mg via INTRAVENOUS

## 2012-09-14 NOTE — H&P (Signed)
Erin Hansen is an 79 y.o. female.   Chief Complaint: recent colon cancer diagnosis Liver lesion noted on CT Scheduled now for liver lesion biopsy HPI: Colon Ca; hx skin ca; T12 fx; IBS  Past Medical History  Diagnosis Date  . DJD (degenerative joint disease)   . Compression fracture     t12  . IBS (irritable bowel syndrome)   . Vaginal prolapse   . TMJ (temporomandibular joint disorder)   . Anemia   . Cancer     skin  . Hearing loss   . Cough   . Constipation   . Weakness   . Rectal bleeding   . Blood in stool     Past Surgical History  Procedure Date  . Appendectomy   . Total abdominal hysterectomy   . Vaginal prolapse repair 2005 - approximate  . Cystectomy     back  . Pelvic fracture surgery     reconstruction  . Nasal reconstruction with septal repair   . Skin cancer excision   . Hernia repair 1950 - approximate    No family history on file. Social History:  reports that she has never smoked. She does not have any smokeless tobacco history on file. She reports that she does not drink alcohol or use illicit drugs.  Allergies:  Allergies  Allergen Reactions  . Codeine Nausea Only     (Not in a hospital admission)  No results found for this or any previous visit (from the past 48 hour(s)). No results found.  Review of Systems  Constitutional: Negative for fever and chills.  Respiratory: Negative for shortness of breath.   Cardiovascular: Negative for chest pain.  Gastrointestinal: Positive for abdominal pain. Negative for nausea and vomiting.  Neurological: Positive for weakness. Negative for dizziness and headaches.    Blood pressure 149/49, pulse 70, temperature 97.5 F (36.4 C), temperature source Oral, resp. rate 20, height 5\' 7"  (1.702 m), weight 153 lb (69.4 kg), SpO2 98.00%. Physical Exam  Constitutional: She is oriented to person, place, and time. She appears well-developed and well-nourished.  Cardiovascular: Normal rate, regular rhythm  and normal heart sounds.   No murmur heard. Respiratory: Effort normal and breath sounds normal. She has no wheezes.  GI: Soft. Bowel sounds are normal. There is no tenderness.  Musculoskeletal: Normal range of motion.       Uses walker/cane for support  Neurological: She is alert and oriented to person, place, and time.  Psychiatric: She has a normal mood and affect. Her behavior is normal. Judgment and thought content normal.     Assessment/Plan Recent dx colon ca Liver lesion Scheduled now for liver lesion bx Pt aware of procedure benefits and risks and agreeable to proceed Consent signed and in chart  Zander Ingham A 09/14/2012, 9:33 AM

## 2012-09-14 NOTE — H&P (Signed)
Agree 

## 2012-09-14 NOTE — Procedures (Signed)
Procedure:  Ultrasound guided liver biopsy Findings:  Right lobe liver lesion visible by ultrasound and sampled by 18 G core biopsy x 3 via 17 G needle.  No complications.

## 2012-09-15 ENCOUNTER — Encounter (HOSPITAL_COMMUNITY)
Admission: RE | Admit: 2012-09-15 | Discharge: 2012-09-15 | Disposition: A | Discharge status: Home / Self Care | Payer: Medicare Other | Source: Ambulatory Visit | Attending: General Surgery | Admitting: General Surgery

## 2012-09-15 ENCOUNTER — Other Ambulatory Visit (INDEPENDENT_AMBULATORY_CARE_PROVIDER_SITE_OTHER): Payer: Self-pay | Admitting: General Surgery

## 2012-09-15 ENCOUNTER — Ambulatory Visit (HOSPITAL_COMMUNITY)
Admission: RE | Admit: 2012-09-15 | Discharge: 2012-09-15 | Disposition: A | Discharge status: Home / Self Care | Payer: Medicare Other | Source: Ambulatory Visit | Attending: General Surgery | Admitting: General Surgery

## 2012-09-15 ENCOUNTER — Encounter (HOSPITAL_COMMUNITY): Payer: Self-pay

## 2012-09-15 DIAGNOSIS — M8448XA Pathological fracture, other site, initial encounter for fracture: Secondary | ICD-10-CM | POA: Insufficient documentation

## 2012-09-15 DIAGNOSIS — C189 Malignant neoplasm of colon, unspecified: Secondary | ICD-10-CM | POA: Insufficient documentation

## 2012-09-15 DIAGNOSIS — J984 Other disorders of lung: Secondary | ICD-10-CM | POA: Insufficient documentation

## 2012-09-15 DIAGNOSIS — Z01812 Encounter for preprocedural laboratory examination: Secondary | ICD-10-CM | POA: Insufficient documentation

## 2012-09-15 DIAGNOSIS — Z01818 Encounter for other preprocedural examination: Secondary | ICD-10-CM | POA: Insufficient documentation

## 2012-09-15 HISTORY — DX: Anxiety disorder, unspecified: F41.9

## 2012-09-15 LAB — CBC WITH DIFFERENTIAL/PLATELET
Eosinophils Relative: 0 % (ref 0–5)
HCT: 30.2 % — ABNORMAL LOW (ref 36.0–46.0)
Lymphocytes Relative: 21 % (ref 12–46)
Lymphs Abs: 2.3 10*3/uL (ref 0.7–4.0)
MCV: 86.5 fL (ref 78.0–100.0)
Neutro Abs: 7.5 10*3/uL (ref 1.7–7.7)
Platelets: 328 10*3/uL (ref 150–400)
RBC: 3.49 MIL/uL — ABNORMAL LOW (ref 3.87–5.11)
WBC: 10.7 10*3/uL — ABNORMAL HIGH (ref 4.0–10.5)

## 2012-09-15 LAB — COMPREHENSIVE METABOLIC PANEL
ALT: 50 U/L — ABNORMAL HIGH (ref 0–35)
Albumin: 3.1 g/dL — ABNORMAL LOW (ref 3.5–5.2)
Alkaline Phosphatase: 84 U/L (ref 39–117)
Potassium: 3 mEq/L — ABNORMAL LOW (ref 3.5–5.1)
Sodium: 138 mEq/L (ref 135–145)
Total Protein: 6.3 g/dL (ref 6.0–8.3)

## 2012-09-15 LAB — SURGICAL PCR SCREEN
MRSA, PCR: NEGATIVE
Staphylococcus aureus: NEGATIVE

## 2012-09-15 LAB — CEA: CEA: 2.8 ng/mL (ref 0.0–5.0)

## 2012-09-15 MED ORDER — METRONIDAZOLE IVPB CUSTOM: 500.0000 mg | Freq: Once | INTRAVENOUS | Status: DC

## 2012-09-15 NOTE — Progress Notes (Signed)
SCIP colon procedures: Ancef plus flagyl. Ancef only is ordered.

## 2012-09-15 NOTE — Patient Instructions (Signed)
[facility]  09/15/2012   Your procedure is scheduled on: 09/20/12  Report to [facility] at  [phone].  Call this number if you have problems the morning of surgery [phone]   Remember:   Do not eat food or drink liquids After Midnight.     Take these medicines the morning of surgery with A SIP OF WATER: sertraline   Do not wear jewelry, make-up or nail polish.  Do not wear lotions, powders, or perfumes. You may wear deodorant.  Do not shave 48 hours prior to surgery. Men may shave face and neck.  Do not bring valuables to the hospital.  Contacts, dentures or bridgework may not be worn into surgery.  Leave suitcase in the car. After surgery it may be brought to your room.  For patients admitted to the hospital, checkout time is 11:00 AM the day of discharge.      Please read over the following fact sheets that you were given: MRSA Information, blood fact sheet Birdie Sons, RN  pre op nurse call if needed [phone]    FAILURE TO FOLLOW THESE INSTRUCTIONS MAY RESULT IN CANCELLATION OF YOUR SURGERY   Patient Signature: ___________________________________________

## 2012-09-15 NOTE — Consult Note (Signed)
WOC ostomy consult  Preoperative stoma site selection for possible LLQ colostomy. Surgery date:  09/20/12 Patient observed in the sitting and standing positions.  No abdominal surgeries, no scars.  Loose flaccid abdomen.  Site marked in LLQ,  4cm to left of umbilicus and 2.5cm below.  Mark covered with thin film transparent dressing.   Education provided: patient understands that ostomy nurse is her post operative resource if a stoma is required next week in surgery.  Voices that her only concern at this time is that she will be in pain postoperatively.  Assured that nursing staff will be responsive to her comfort needs and that she need only voice her discomfort and request pain medication for it to be addressed.  Expressed gratitude and understanding.  Declined teaching materials at this time stating that "it might not be needed." I will follow should patient receive a diversion.  Please re-consult if stoma constructed. Thanks, Ladona Mow, MSN, RN, [facility], CWOCN [phone])

## 2012-09-15 NOTE — Progress Notes (Signed)
Chest x-ray 09/11/11 on chart will repeat because out of date, office visit Dr. Pete Glatter 03/16/12 on chart, EKG 03/16/12 on chart, Ct chest 08/26/12 on chart, CT abdomen and pelvis 08/26/12 on chart

## 2012-09-16 ENCOUNTER — Ambulatory Visit: Payer: Medicare Other

## 2012-09-16 ENCOUNTER — Ambulatory Visit (HOSPITAL_BASED_OUTPATIENT_CLINIC_OR_DEPARTMENT_OTHER): Payer: Medicare Other | Admitting: Oncology

## 2012-09-16 ENCOUNTER — Telehealth: Payer: Self-pay | Admitting: Oncology

## 2012-09-16 VITALS — BP 134/64 | HR 81 | Temp 96.9°F | Resp 18 | Ht 67.0 in | Wt 154.9 lb

## 2012-09-16 DIAGNOSIS — C187 Malignant neoplasm of sigmoid colon: Secondary | ICD-10-CM

## 2012-09-16 DIAGNOSIS — D649 Anemia, unspecified: Secondary | ICD-10-CM

## 2012-09-16 DIAGNOSIS — C189 Malignant neoplasm of colon, unspecified: Secondary | ICD-10-CM

## 2012-09-16 DIAGNOSIS — E876 Hypokalemia: Secondary | ICD-10-CM

## 2012-09-16 MED ORDER — ACETAMINOPHEN 325 MG PO TABS
650.0000 mg | ORAL_TABLET | Freq: Once | ORAL | Status: AC
  Administered 2012-09-16: 650 mg via ORAL

## 2012-09-16 MED ORDER — POTASSIUM CHLORIDE CRYS ER 20 MEQ PO TBCR: 20.0000 meq | EXTENDED_RELEASE_TABLET | Freq: Every day | ORAL | Status: DC

## 2012-09-16 NOTE — Progress Notes (Signed)
Met with patient and her friend.  Explained role of nurse navigator.  Educational information provided on colorectal cancer.  Patient scheduled for surgery next week.  Will continue to follow for needs.

## 2012-09-16 NOTE — Telephone Encounter (Signed)
Gave pt appt for MD only on February 2014

## 2012-09-16 NOTE — Progress Notes (Signed)
[facility] New Patient Consult   Referring MD: Nisa Decaire 79 y.o.  Aug 19, 1926    Reason for Referral: Colon cancer     HPI: Beginning in August of this year she noted intermittent nausea, abdominal discomfort, and lethargy. She also reports diarrhea and intermittent blood per rectum. She was referred to Dr. Dulce Sellar and underwent a flexible sigmoidoscopy on 08/22/2012. A fungating mass was noted in the sigmoid colon. The mass was partially obstructing. Biopsies were obtained and the pathology confirmed adenocarcinoma.  She was referred for staging CTs of the chest, abdomen, and pelvis on 08/26/2012. There was no evidence for metastatic disease in the lungs. An 11 mm low-attenuation lesion was noted in the right liver that did not appear to be a cyst. A 6 x 12 mm soft tissue nodule was noted in the right upper quadrant posterior to the liver and a peritoneal implants could not be excluded. No adenopathy. Abnormal soft tissue mass in the left pelvis no evidence of local extension of tumor. No adenopathy.  She saw Dr. Dwain Sarna and was referred for an ultrasound guided biopsy of the liver lesion on 09/14/2012. The pathology revealed no malignancy.  She discussed surgical options with Dr. Dwain Sarna and is scheduled for a colectomy and colostomy on 09/21/2011.  Past Medical History  Diagnosis Date  . DJD (degenerative joint disease)   . Compression fracture     t12  . IBS (irritable bowel syndrome)   . Vaginal prolapse   . TMJ (temporomandibular joint disorder)   . Anemia   .  sigmoid colon cancer   08/22/2012   .  G3 P3                              Past Surgical History  Procedure Date  . Appendectomy   . Vaginal prolapse repair 2005 - approximate  . Cystectomy     back  . Pelvic fracture surgery     reconstruction  . Nasal reconstruction with septal repair   . Skin cancer excision   . Arthroscopy left knee 2007  . Colonoscopy   08/22/2012   . Total abdominal hysterectomy 1978  . Hernia repair 1980 - approximate  . Eye surgery [phone]    cataracts bilateral  . Back surgery     mid back   Family history: No family history of colon, rectal, or uterine cancer. One son died at age 52 of sarcoma. Another son died at age 73 of a brain tumor.  Current outpatient prescriptions:acetaminophen (TYLENOL) 500 MG tablet, Take 1,000 mg by mouth every 4 (four) hours as needed. For pain, Disp: , Rfl: ;  cholecalciferol (VITAMIN D) 1000 UNITS tablet, Take 1,000 Units by mouth every morning., Disp: , Rfl: ;  docusate sodium (COLACE) 100 MG capsule, Take 400 mg by mouth at bedtime as needed. For constipation, Disp: , Rfl:  erythromycin ophthalmic ointment, Place 1 application into both eyes at bedtime., Disp: , Rfl: ;  ferrous sulfate 325 (65 FE) MG tablet, Take 325 mg by mouth 2 (two) times a week. Takes on Wednesdays and Sundays., Disp: , Rfl: ;  fish oil-omega-3 fatty acids 1000 MG capsule, Take 1 g by mouth daily., Disp: , Rfl: ;  hydrochlorothiazide (HYDRODIURIL) 25 MG tablet, Take 25 mg by mouth every morning. , Disp: , Rfl:  lactulose (CHRONULAC) 10 GM/15ML solution, Take 40 g by mouth at bedtime., Disp: , Rfl: ;  Melatonin 5 MG TABS, Take 5 mg by mouth at bedtime as needed. For sleep, Disp: , Rfl: ;  Probiotic Product (PROBIOTIC DAILY PO), Take 1 tablet by mouth every morning., Disp: , Rfl: ;  psyllium (METAMUCIL) 58.6 % powder, Take 1 packet by mouth at bedtime. Takes following lactulose., Disp: , Rfl:  pyridOXINE (VITAMIN B-6) 100 MG tablet, Take 100 mg by mouth every morning. , Disp: , Rfl: ;  sertraline (ZOLOFT) 50 MG tablet, Take 75 mg by mouth every morning., Disp: , Rfl: ;  vitamin C (ASCORBIC ACID) 500 MG tablet, Take 500 mg by mouth every morning. , Disp: , Rfl:   Allergies:  Allergies  Allergen Reactions  . Codeine Nausea Only    Social History: She lives in the independent living area of [facility]. She is a widow.  She previously worked in a business occupation. She does not use tobacco or alcohol. No transfusion history.   Positives include: 20 pound weight loss, occasional cough/knees, intermittent diarrhea and rectal bleeding, dull discomfort in the suprapubic area, soreness with a pleuritic discomfort at the right costal margin following a liver biopsy-improved  A complete ROS was otherwise negative.  Physical Exam:  Blood pressure 134/64, pulse 81, temperature 96.9 F (36.1 C), temperature source Oral, resp. rate 18, height 5\' 7"  (1.702 m), weight 154 lb 14.4 oz (70.262 kg).  HEENT: Oropharynx without visible mass, neck without mass Lungs: Clear bilaterally Cardiac: Regular rate and rhythm Abdomen: No hepatomegaly, no mass, mild tenderness at the right lateral costal margin with a small ecchymosis at the liver biopsy site  Vascular: No leg edema Lymph nodes: No cervical, supraclavicular, axillary, or inguinal nodes Neurologic: Alert and oriented, the motor exam appears grossly intact in the upper and lower extremities Skin: No rash   LAB:  CBC  Lab Results  Component Value Date   WBC 10.7* 09/15/2012   HGB 10.0* 09/15/2012   HCT 30.2* 09/15/2012   MCV 86.5 09/15/2012   PLT 328 09/15/2012     CMP      Component Value Date/Time   NA 138 09/15/2012 1220   K 3.0* 09/15/2012 1220   CL 99 09/15/2012 1220   CO2 30 09/15/2012 1220   GLUCOSE 115* 09/15/2012 1220   BUN 20 09/15/2012 1220   CREATININE 0.71 09/15/2012 1220   CALCIUM 9.2 09/15/2012 1220   PROT 6.3 09/15/2012 1220   ALBUMIN 3.1* 09/15/2012 1220   AST 54* 09/15/2012 1220   ALT 50* 09/15/2012 1220   ALKPHOS 84 09/15/2012 1220   BILITOT 0.5 09/15/2012 1220   GFRNONAA 76* 09/15/2012 1220   GFRAA 89* 09/15/2012 1220     Radiology: As per history of present illness    Assessment/Plan:   1. Adenocarcinoma of the sigmoid colon 2. Diarrhea/rectal bleeding secondary to the sigmoid colon tumor 3. Indeterminate liver lesion and peritoneal nodule on a CT  08/26/2012-status post an ultrasound-guided biopsy of the liver lesion on 09/14/2012 with no malignancy found 4. Anemia -- likely secondary to GI blood loss 5. Hypokalemia on labs 09/15/2012-likely secondary to diarrhea and HCTZ, we will attempt to contact her and begin potassium replacement    Disposition:   She has been diagnosed with colon cancer. I discussed the standard treatment approach for patients with colon cancer. She understands surgery is usually performed first and we decide on the indication for adjuvant therapy based on the final surgical pathology.  It is not clear whether she has a localized colon cancer or metastatic disease.  It would be unusual to have a single liver lesion and peritoneal implant. A CEA has not been obtained.  I plan to discuss the case with Dr. Dwain Sarna prior to surgery. We were unable to a CEA to the 09/15/2012 lab.  I will discuss the indication for proceeding with a standard colectomy/anastomosis versus colostomy procedure with Dr. Dwain Sarna. Dr. Dwain Sarna may be able to assess the liver intraoperatively.  [name] will return for an office visit in approximately 3 weeks. We will review the surgical pathology report and decide on the indication for adjuvant therapy. She appears to be in excellent general health for her age and will be a candidate for adjuvant Xeloda if indicated. Erin Hansen 09/16/2012, 7:08 PM

## 2012-09-19 ENCOUNTER — Ambulatory Visit (INDEPENDENT_AMBULATORY_CARE_PROVIDER_SITE_OTHER): Payer: Medicare Other | Admitting: General Surgery

## 2012-09-19 ENCOUNTER — Encounter (INDEPENDENT_AMBULATORY_CARE_PROVIDER_SITE_OTHER): Payer: Self-pay | Admitting: General Surgery

## 2012-09-19 ENCOUNTER — Telehealth (INDEPENDENT_AMBULATORY_CARE_PROVIDER_SITE_OTHER): Payer: Self-pay

## 2012-09-19 VITALS — BP 130/66 | HR 74 | Temp 97.1°F | Resp 18 | Ht 67.0 in | Wt 152.2 lb

## 2012-09-19 DIAGNOSIS — C189 Malignant neoplasm of colon, unspecified: Secondary | ICD-10-CM

## 2012-09-19 NOTE — Telephone Encounter (Signed)
Called pt and asked if she would come in this afternoon to speak with Dr Dwain Sarna again before her surgery tomorrow. The pt will come in this afternoon at 4:15.

## 2012-09-19 NOTE — Progress Notes (Signed)
Subjective:     Patient ID: Erin Hansen, female   DOB: 1925/12/06, 79 y.o.   MRN: [id number]  HPI  79 year old female I saw last week in the new diagnosis of a sigmoid colon cancer. She since then has undergone a biopsy of the liver lesion which is benign. She has also been seen by Dr. Truett Perna of medical oncology. She comes back in today the day prior to her surgical for surgical plan. Review of Systems     Objective:   Physical Exam deferred    Assessment:     Colon cancer    Plan:    We had about a 25 minute discussion today in conjunction with her friend about the surgical plan for tomorrow. Initially I told her I would recommend a laparoscopic assisted sigmoid colectomy with an end colostomy. In our last visit we noted a lot of leakage from her anus and she stated that she was leaking a lot of mucus, stool, and blood from her anus and she was not able to really control this. She is in a diaper in her assisted living facility does not really leave her room. her quality of life due to that has not been very good. I am concerned and remained concerned about giving her anastomosis and her ability to control this. I think that there is a good chance she may still end up in a diaper not able to leave her room at all. I recommend her previously a colostomy and she is now talked to several people and is willing to proceed with that. I had her come back today to discuss a primary anastomosis and the risks involved with that. I offered to perform an anastomosis but after a long discussion with her friend we have decided to proceed with a colostomy still.

## 2012-09-20 ENCOUNTER — Encounter (HOSPITAL_COMMUNITY)
Admission: RE | Disposition: A | Discharge status: Skilled Nursing Facility | Payer: Self-pay | Source: Ambulatory Visit | Attending: General Surgery

## 2012-09-20 ENCOUNTER — Encounter (HOSPITAL_COMMUNITY): Payer: Self-pay | Admitting: Anesthesiology

## 2012-09-20 ENCOUNTER — Inpatient Hospital Stay (HOSPITAL_COMMUNITY)
Admission: RE | Admit: 2012-09-20 | Discharge: 2012-09-27 | DRG: 330 | Disposition: A | Discharge status: Skilled Nursing Facility | Payer: Medicare Other | Source: Ambulatory Visit | Attending: General Surgery | Admitting: General Surgery

## 2012-09-20 ENCOUNTER — Encounter (HOSPITAL_COMMUNITY): Payer: Self-pay | Admitting: *Deleted

## 2012-09-20 ENCOUNTER — Inpatient Hospital Stay (HOSPITAL_COMMUNITY): Payer: Medicare Other | Admitting: Anesthesiology

## 2012-09-20 DIAGNOSIS — M26609 Unspecified temporomandibular joint disorder, unspecified side: Secondary | ICD-10-CM | POA: Diagnosis present

## 2012-09-20 DIAGNOSIS — D62 Acute posthemorrhagic anemia: Secondary | ICD-10-CM | POA: Diagnosis not present

## 2012-09-20 DIAGNOSIS — D500 Iron deficiency anemia secondary to blood loss (chronic): Secondary | ICD-10-CM | POA: Diagnosis present

## 2012-09-20 DIAGNOSIS — Z79899 Other long term (current) drug therapy: Secondary | ICD-10-CM

## 2012-09-20 DIAGNOSIS — C187 Malignant neoplasm of sigmoid colon: Principal | ICD-10-CM | POA: Diagnosis present

## 2012-09-20 DIAGNOSIS — H9190 Unspecified hearing loss, unspecified ear: Secondary | ICD-10-CM | POA: Diagnosis present

## 2012-09-20 DIAGNOSIS — E876 Hypokalemia: Secondary | ICD-10-CM

## 2012-09-20 DIAGNOSIS — C189 Malignant neoplasm of colon, unspecified: Secondary | ICD-10-CM

## 2012-09-20 HISTORY — PX: COLOSTOMY: SHX63

## 2012-09-20 HISTORY — PX: LAPAROSCOPIC SIGMOID COLECTOMY: SHX5928

## 2012-09-20 LAB — POCT I-STAT 4, (NA,K, GLUC, HGB,HCT)
Glucose, Bld: 135 mg/dL — ABNORMAL HIGH (ref 70–99)
HCT: 29 % — ABNORMAL LOW (ref 36.0–46.0)
Hemoglobin: 9.9 g/dL — ABNORMAL LOW (ref 12.0–15.0)

## 2012-09-20 LAB — TYPE AND SCREEN: ABO/RH(D): A POS

## 2012-09-20 LAB — BASIC METABOLIC PANEL
BUN: 16 mg/dL (ref 6–23)
Calcium: 8.4 mg/dL (ref 8.4–10.5)
GFR calc Af Amer: >90 mL/min (ref >90)
GFR calc non Af Amer: 86 mL/min — ABNORMAL LOW (ref >90)
Glucose, Bld: 156 mg/dL — ABNORMAL HIGH (ref 70–99)
Potassium: 3.2 mEq/L — ABNORMAL LOW (ref 3.5–5.1)

## 2012-09-20 LAB — CBC
Hemoglobin: 8.2 g/dL — ABNORMAL LOW (ref 12.0–15.0)
MCH: 29.4 pg (ref 26.0–34.0)
MCHC: 33.2 g/dL (ref 30.0–36.0)
Platelets: 274 10*3/uL (ref 150–400)
RDW: 13.6 % (ref 11.5–15.5)

## 2012-09-20 SURGERY — COLECTOMY, SIGMOID, LAPAROSCOPIC
Anesthesia: General | Site: Abdomen | Wound class: Clean Contaminated

## 2012-09-20 MED ORDER — SODIUM CHLORIDE 0.9 % IJ SOLN: 9.0000 mL | INTRAMUSCULAR | Status: DC | PRN

## 2012-09-20 MED ORDER — PANTOPRAZOLE SODIUM 40 MG IV SOLR
40.0000 mg | Freq: Every day | INTRAVENOUS | Status: DC
  Administered 2012-09-20 – 2012-09-25 (×6): 40 mg via INTRAVENOUS
  Filled 2012-09-20 (×8): qty 40

## 2012-09-20 MED ORDER — METRONIDAZOLE IN NACL 5-0.79 MG/ML-% IV SOLN
500.0000 mg | Freq: Once | INTRAVENOUS | Status: DC
  Filled 2012-09-20: qty 100

## 2012-09-20 MED ORDER — ONDANSETRON HCL 4 MG/2ML IJ SOLN: 4.0000 mg | Freq: Four times a day (QID) | INTRAMUSCULAR | Status: DC | PRN

## 2012-09-20 MED ORDER — MORPHINE SULFATE (PF) 1 MG/ML IV SOLN
INTRAVENOUS | Status: DC
  Administered 2012-09-20: 13:00:00 via INTRAVENOUS
  Administered 2012-09-21: 4 mg via INTRAVENOUS
  Administered 2012-09-21: 1 mg via INTRAVENOUS
  Administered 2012-09-21: 5 mg via INTRAVENOUS
  Administered 2012-09-21: 4 mg via INTRAVENOUS
  Administered 2012-09-21: 2 mg via INTRAVENOUS
  Administered 2012-09-21: 18:00:00 via INTRAVENOUS
  Administered 2012-09-21: 7 mg via INTRAVENOUS
  Administered 2012-09-22 (×2): 6 mg via INTRAVENOUS
  Administered 2012-09-22 – 2012-09-23 (×3): 2 mg via INTRAVENOUS
  Administered 2012-09-23 (×2): 1 mg via INTRAVENOUS
  Administered 2012-09-23: 2 mg via INTRAVENOUS
  Administered 2012-09-23: 08:00:00 via INTRAVENOUS
  Administered 2012-09-23: 6 mg via INTRAVENOUS
  Administered 2012-09-24: 2 mg via INTRAVENOUS
  Administered 2012-09-24: 1 mg via INTRAVENOUS
  Administered 2012-09-24: 0.99 mg via INTRAVENOUS
  Filled 2012-09-20 (×2): qty 25

## 2012-09-20 MED ORDER — SODIUM CHLORIDE 0.9 % IV SOLN
INTRAVENOUS | Status: DC
  Administered 2012-09-20: 16:00:00 via INTRAVENOUS

## 2012-09-20 MED ORDER — PROPOFOL 10 MG/ML IV BOLUS
INTRAVENOUS | Status: DC | PRN
  Administered 2012-09-20: 200 mg via INTRAVENOUS

## 2012-09-20 MED ORDER — SUFENTANIL CITRATE 50 MCG/ML IV SOLN
INTRAVENOUS | Status: DC | PRN
  Administered 2012-09-20: 10 ug via INTRAVENOUS
  Administered 2012-09-20: 15 ug via INTRAVENOUS
  Administered 2012-09-20: 10 ug via INTRAVENOUS
  Administered 2012-09-20: 15 ug via INTRAVENOUS
  Administered 2012-09-20 (×4): 10 ug via INTRAVENOUS

## 2012-09-20 MED ORDER — 0.9 % SODIUM CHLORIDE (POUR BTL) OPTIME
TOPICAL | Status: DC | PRN
  Administered 2012-09-20 (×2): 1000 mL

## 2012-09-20 MED ORDER — NEOSTIGMINE METHYLSULFATE 1 MG/ML IJ SOLN
INTRAMUSCULAR | Status: DC | PRN
  Administered 2012-09-20: 4 mg via INTRAVENOUS

## 2012-09-20 MED ORDER — GLYCOPYRROLATE 0.2 MG/ML IJ SOLN
INTRAMUSCULAR | Status: DC | PRN
  Administered 2012-09-20: 0.6 mg via INTRAVENOUS

## 2012-09-20 MED ORDER — ACETAMINOPHEN 325 MG PO TABS: 650.0000 mg | ORAL_TABLET | Freq: Four times a day (QID) | ORAL | Status: DC | PRN

## 2012-09-20 MED ORDER — CEFAZOLIN SODIUM-DEXTROSE 2-3 GM-% IV SOLR
2.0000 g | INTRAVENOUS | Status: AC
  Administered 2012-09-20 (×2): 2 g via INTRAVENOUS

## 2012-09-20 MED ORDER — LACTATED RINGERS IV SOLN: INTRAVENOUS | Status: DC

## 2012-09-20 MED ORDER — CEFAZOLIN SODIUM 1-5 GM-% IV SOLN
1.0000 g | Freq: Three times a day (TID) | INTRAVENOUS | Status: AC
  Administered 2012-09-20 – 2012-09-21 (×2): 1 g via INTRAVENOUS
  Filled 2012-09-20 (×3): qty 50

## 2012-09-20 MED ORDER — LACTATED RINGERS IV SOLN
INTRAVENOUS | Status: DC | PRN
  Administered 2012-09-20: 1000 mL

## 2012-09-20 MED ORDER — HEPARIN SODIUM (PORCINE) 5000 UNIT/ML IJ SOLN
5000.0000 [IU] | Freq: Three times a day (TID) | INTRAMUSCULAR | Status: DC
  Administered 2012-09-20 – 2012-09-27 (×20): 5000 [IU] via SUBCUTANEOUS
  Filled 2012-09-20 (×25): qty 1

## 2012-09-20 MED ORDER — LACTATED RINGERS IV SOLN
INTRAVENOUS | Status: DC | PRN
  Administered 2012-09-20 (×3): via INTRAVENOUS

## 2012-09-20 MED ORDER — METRONIDAZOLE IN NACL 5-0.79 MG/ML-% IV SOLN
INTRAVENOUS | Status: DC | PRN
  Administered 2012-09-20: 500 g via INTRAVENOUS

## 2012-09-20 MED ORDER — HYDROMORPHONE HCL PF 1 MG/ML IJ SOLN
INTRAMUSCULAR | Status: AC
  Administered 2012-09-20: 14:00:00
  Filled 2012-09-20: qty 1

## 2012-09-20 MED ORDER — POTASSIUM CHLORIDE 10 MEQ/100ML IV SOLN
10.0000 meq | INTRAVENOUS | Status: AC
  Administered 2012-09-20 (×4): 10 meq via INTRAVENOUS
  Filled 2012-09-20 (×4): qty 100
  Filled 2012-09-20: qty 400

## 2012-09-20 MED ORDER — LABETALOL HCL 5 MG/ML IV SOLN
INTRAVENOUS | Status: DC | PRN
  Administered 2012-09-20: 2.5 mg via INTRAVENOUS
  Administered 2012-09-20: 5 mg via INTRAVENOUS
  Administered 2012-09-20: 2.5 mg via INTRAVENOUS

## 2012-09-20 MED ORDER — BUPIVACAINE-EPINEPHRINE 0.25% -1:200000 IJ SOLN
INTRAMUSCULAR | Status: DC | PRN
  Administered 2012-09-20: 15 mL

## 2012-09-20 MED ORDER — DIPHENHYDRAMINE HCL 12.5 MG/5ML PO ELIX: 12.5000 mg | ORAL_SOLUTION | Freq: Four times a day (QID) | ORAL | Status: DC | PRN

## 2012-09-20 MED ORDER — ONDANSETRON HCL 4 MG/2ML IJ SOLN
INTRAMUSCULAR | Status: DC | PRN
  Administered 2012-09-20: 4 mg via INTRAVENOUS

## 2012-09-20 MED ORDER — HYDROMORPHONE HCL PF 1 MG/ML IJ SOLN
0.2500 mg | INTRAMUSCULAR | Status: DC | PRN
  Administered 2012-09-20 (×4): 0.5 mg via INTRAVENOUS

## 2012-09-20 MED ORDER — HETASTARCH-ELECTROLYTES 6 % IV SOLN
INTRAVENOUS | Status: DC | PRN
  Administered 2012-09-20: 11:00:00 via INTRAVENOUS

## 2012-09-20 MED ORDER — ERYTHROMYCIN 5 MG/GM OP OINT
1.0000 "application " | TOPICAL_OINTMENT | Freq: Every day | OPHTHALMIC | Status: DC
  Administered 2012-09-20 – 2012-09-23 (×4): 1 via OPHTHALMIC
  Filled 2012-09-20 (×2): qty 3.5

## 2012-09-20 MED ORDER — DIPHENHYDRAMINE HCL 50 MG/ML IJ SOLN: 12.5000 mg | Freq: Four times a day (QID) | INTRAMUSCULAR | Status: DC | PRN

## 2012-09-20 MED ORDER — NALOXONE HCL 0.4 MG/ML IJ SOLN: 0.4000 mg | INTRAMUSCULAR | Status: DC | PRN

## 2012-09-20 MED ORDER — ACETAMINOPHEN 650 MG RE SUPP: 650.0000 mg | Freq: Four times a day (QID) | RECTAL | Status: DC | PRN

## 2012-09-20 MED ORDER — HEPARIN SODIUM (PORCINE) 5000 UNIT/ML IJ SOLN
5000.0000 [IU] | Freq: Once | INTRAMUSCULAR | Status: AC
  Administered 2012-09-20: 5000 [IU] via SUBCUTANEOUS
  Filled 2012-09-20: qty 1

## 2012-09-20 MED ORDER — CEFAZOLIN SODIUM-DEXTROSE 2-3 GM-% IV SOLR
2.0000 g | INTRAVENOUS | Status: DC
  Administered 2012-09-20: 2 g via INTRAVENOUS

## 2012-09-20 MED ORDER — SERTRALINE HCL 50 MG PO TABS
75.0000 mg | ORAL_TABLET | Freq: Every morning | ORAL | Status: DC
  Administered 2012-09-21 – 2012-09-27 (×7): 75 mg via ORAL
  Filled 2012-09-20 (×7): qty 1

## 2012-09-20 MED ORDER — ROCURONIUM BROMIDE 100 MG/10ML IV SOLN
INTRAVENOUS | Status: DC | PRN
  Administered 2012-09-20: 10 mg via INTRAVENOUS
  Administered 2012-09-20: 50 mg via INTRAVENOUS

## 2012-09-20 MED ORDER — LIDOCAINE HCL (CARDIAC) 20 MG/ML IV SOLN
INTRAVENOUS | Status: DC | PRN
  Administered 2012-09-20: 100 mg via INTRAVENOUS

## 2012-09-20 MED ORDER — DEXAMETHASONE SODIUM PHOSPHATE 10 MG/ML IJ SOLN
INTRAMUSCULAR | Status: DC | PRN
  Administered 2012-09-20: 10 mg via INTRAVENOUS

## 2012-09-20 MED ORDER — BIOTENE DRY MOUTH MT LIQD
15.0000 mL | Freq: Two times a day (BID) | OROMUCOSAL | Status: DC
  Administered 2012-09-20 – 2012-09-21 (×3): 15 mL via OROMUCOSAL

## 2012-09-20 MED ORDER — METRONIDAZOLE IN NACL 5-0.79 MG/ML-% IV SOLN
500.0000 mg | Freq: Three times a day (TID) | INTRAVENOUS | Status: AC
  Administered 2012-09-20 – 2012-09-21 (×2): 500 mg via INTRAVENOUS
  Filled 2012-09-20 (×3): qty 100

## 2012-09-20 SURGICAL SUPPLY — 105 items
ADH SKN CLS APL DERMABOND .7 (GAUZE/BANDAGES/DRESSINGS) ×1
APPLICATOR COTTON TIP 6IN STRL (MISCELLANEOUS) IMPLANT
APPLIER CLIP 5 13 M/L LIGAMAX5 (MISCELLANEOUS)
APR CLP MED LRG 5 ANG JAW (MISCELLANEOUS)
BLADE EXTENDED COATED 6.5IN (ELECTRODE) ×2 IMPLANT
BLADE HEX COATED 2.75 (ELECTRODE) ×2 IMPLANT
BLADE SURG SZ10 CARB STEEL (BLADE) ×4 IMPLANT
CABLE HIGH FREQUENCY MONO STRZ (ELECTRODE) ×2 IMPLANT
CANISTER SUCTION 2500CC (MISCELLANEOUS) ×2 IMPLANT
CELLS DAT CNTRL 66122 CELL SVR (MISCELLANEOUS) IMPLANT
CHLORAPREP W/TINT 26ML (MISCELLANEOUS) ×2 IMPLANT
CLIP APPLIE 5 13 M/L LIGAMAX5 (MISCELLANEOUS) IMPLANT
CLIP TI LARGE 6 (CLIP) IMPLANT
CLOTH BEACON ORANGE TIMEOUT ST (SAFETY) ×2 IMPLANT
CONNECTOR 5 IN 1 STRAIGHT STRL (MISCELLANEOUS) IMPLANT
COVER MAYO STAND STRL (DRAPES) ×2 IMPLANT
COVER SURGICAL LIGHT HANDLE (MISCELLANEOUS) IMPLANT
CUTTER FLEX LINEAR 45M (STAPLE) ×2 IMPLANT
DECANTER SPIKE VIAL GLASS SM (MISCELLANEOUS) IMPLANT
DERMABOND ADVANCED (GAUZE/BANDAGES/DRESSINGS) ×1
DERMABOND ADVANCED .7 DNX12 (GAUZE/BANDAGES/DRESSINGS) ×1 IMPLANT
DEVICE TROCAR PUNCTURE CLOSURE (ENDOMECHANICALS) ×2 IMPLANT
DRAIN CHANNEL 19F RND (DRAIN) IMPLANT
DRAPE LAPAROSCOPIC ABDOMINAL (DRAPES) ×2 IMPLANT
DRAPE LG THREE QUARTER DISP (DRAPES) IMPLANT
DRAPE WARM FLUID 44X44 (DRAPE) ×2 IMPLANT
DRSG TEGADERM 4X4.75 (GAUZE/BANDAGES/DRESSINGS) ×2 IMPLANT
DRSG TEGADERM 6X8 (GAUZE/BANDAGES/DRESSINGS) IMPLANT
DRSG TELFA PLUS 4X6 ADH ISLAND (GAUZE/BANDAGES/DRESSINGS) IMPLANT
ELECT REM PT RETURN 9FT ADLT (ELECTROSURGICAL) ×2
ELECTRODE REM PT RTRN 9FT ADLT (ELECTROSURGICAL) ×1 IMPLANT
ENSEAL DEVICE STD TIP 35CM (ENDOMECHANICALS) IMPLANT
EVACUATOR SILICONE 100CC (DRAIN) IMPLANT
GLOVE BIO SURGEON STRL SZ7 (GLOVE) ×2 IMPLANT
GLOVE BIOGEL PI IND STRL 7.0 (GLOVE) ×1 IMPLANT
GLOVE BIOGEL PI IND STRL 7.5 (GLOVE) ×1 IMPLANT
GLOVE BIOGEL PI INDICATOR 7.0 (GLOVE) ×1
GLOVE BIOGEL PI INDICATOR 7.5 (GLOVE) ×1
GLOVE ECLIPSE 6.5 STRL STRAW (GLOVE) ×2 IMPLANT
GLOVE SS BIOGEL STRL SZ 7 (GLOVE) ×3 IMPLANT
GLOVE SUPERSENSE BIOGEL SZ 7 (GLOVE) ×3
GOWN STRL NON-REIN LRG LVL3 (GOWN DISPOSABLE) ×4 IMPLANT
GOWN STRL REIN XL XLG (GOWN DISPOSABLE) ×8 IMPLANT
HAND ACTIVATED (MISCELLANEOUS) IMPLANT
KIT BASIN OR (CUSTOM PROCEDURE TRAY) ×2 IMPLANT
LEGGING LITHOTOMY PAIR STRL (DRAPES) ×2 IMPLANT
LIGASURE IMPACT 36 18CM CVD LR (INSTRUMENTS) IMPLANT
NS IRRIG 1000ML POUR BTL (IV SOLUTION) ×4 IMPLANT
PACK GENERAL/GYN (CUSTOM PROCEDURE TRAY) IMPLANT
PENCIL BUTTON HOLSTER BLD 10FT (ELECTRODE) ×2 IMPLANT
RELOAD 45 VASCULAR/THIN (ENDOMECHANICALS) ×2 IMPLANT
RELOAD BLUE (STAPLE) ×4 IMPLANT
RTRCTR WOUND ALEXIS 18CM MED (MISCELLANEOUS)
SCALPEL HARMONIC ACE (MISCELLANEOUS) ×2 IMPLANT
SCISSORS LAP 5X35 DISP (ENDOMECHANICALS) IMPLANT
SET IRRIG TUBING LAPAROSCOPIC (IRRIGATION / IRRIGATOR) ×2 IMPLANT
SOLUTION ANTI FOG 6CC (MISCELLANEOUS) ×2 IMPLANT
SPONGE DRAIN TRACH 4X4 STRL 2S (GAUZE/BANDAGES/DRESSINGS) IMPLANT
SPONGE GAUZE 4X4 12PLY (GAUZE/BANDAGES/DRESSINGS) IMPLANT
SPONGE LAP 18X18 X RAY DECT (DISPOSABLE) IMPLANT
STAPLE ECHEON FLEX 60 POW ENDO (STAPLE) ×2 IMPLANT
STAPLER VISISTAT 35W (STAPLE) ×2 IMPLANT
STRIP CLOSURE SKIN 1/2X4 (GAUZE/BANDAGES/DRESSINGS) ×4 IMPLANT
SUCTION POOLE TIP (SUCTIONS) ×2 IMPLANT
SUT MNCRL AB 4-0 PS2 18 (SUTURE) ×2 IMPLANT
SUT MON AB 4-0 SH 27 (SUTURE) IMPLANT
SUT NOV 1 T60/GS (SUTURE) IMPLANT
SUT NOVA T20/GS 25 (SUTURE) IMPLANT
SUT PDS AB 1 CT1 27 (SUTURE) IMPLANT
SUT PDS AB 1 CTX 36 (SUTURE) IMPLANT
SUT PDS AB 1 TP1 54 (SUTURE) IMPLANT
SUT PDS AB 4-0 SH 27 (SUTURE) IMPLANT
SUT PROLENE 2 0 KS (SUTURE) IMPLANT
SUT SILK 2 0 (SUTURE) ×2
SUT SILK 2 0 SH CR/8 (SUTURE) ×2 IMPLANT
SUT SILK 2 0SH CR/8 30 (SUTURE) IMPLANT
SUT SILK 2-0 18XBRD TIE 12 (SUTURE) ×1 IMPLANT
SUT SILK 2-0 30XBRD TIE 12 (SUTURE) IMPLANT
SUT SILK 3 0 (SUTURE) ×2
SUT SILK 3 0 SH CR/8 (SUTURE) ×2 IMPLANT
SUT SILK 3-0 18XBRD TIE 12 (SUTURE) ×1 IMPLANT
SUT VIC AB 2-0 CT1 27 (SUTURE)
SUT VIC AB 2-0 CT1 27XBRD (SUTURE) IMPLANT
SUT VIC AB 2-0 SH 27 (SUTURE) ×1
SUT VIC AB 2-0 SH 27X BRD (SUTURE) ×1 IMPLANT
SUT VIC AB 3-0 PS2 18 (SUTURE)
SUT VIC AB 3-0 PS2 18XBRD (SUTURE) IMPLANT
SUT VIC AB 3-0 SH 27 (SUTURE) ×1
SUT VIC AB 3-0 SH 27XBRD (SUTURE) ×1 IMPLANT
SUT VIC AB 3-0 SH 8-18 (SUTURE) ×4 IMPLANT
SUT VIC AB 4-0 SH 18 (SUTURE) IMPLANT
SUT VICRYL 0 UR6 27IN ABS (SUTURE) IMPLANT
SYR BULB IRRIGATION 50ML (SYRINGE) ×2 IMPLANT
SYS LAPSCP GELPORT 120MM (MISCELLANEOUS)
SYSTEM LAPSCP GELPORT 120MM (MISCELLANEOUS) IMPLANT
TOWEL OR 17X26 10 PK STRL BLUE (TOWEL DISPOSABLE) ×4 IMPLANT
TRAY FOLEY CATH 14FRSI W/METER (CATHETERS) ×2 IMPLANT
TROCAR BLADELESS OPT 5 100 (ENDOMECHANICALS) ×4 IMPLANT
TROCAR XCEL BLUNT TIP 100MML (ENDOMECHANICALS) ×2 IMPLANT
TROCAR XCEL NON-BLD 5MMX100MML (ENDOMECHANICALS) ×2 IMPLANT
TUBING CONNECTING 10 (TUBING) IMPLANT
TUBING FILTER THERMOFLATOR (ELECTROSURGICAL) ×2 IMPLANT
WATER STERILE IRR 1500ML POUR (IV SOLUTION) ×2 IMPLANT
YANKAUER SUCT BULB TIP 10FT TU (MISCELLANEOUS) ×2 IMPLANT
YANKAUER SUCT BULB TIP NO VENT (SUCTIONS) ×2 IMPLANT

## 2012-09-20 NOTE — Op Note (Signed)
Preoperative diagnosis: Sigmoid colon cancer Postoperative diagnosis: Same as above Procedure: #1 laparoscopic sigmoid colectomy #2 end descending colostomy Surgeon: Dr. Harden Mo Asst.: Dr. Cicero Duck Anesthesia: Gen. Endotracheal Estimated blood loss: 50 cc Specimens: Sigmoid colon marked with short stitch distal Complications: None Drains: None Sponge and needle count correct x2 at end of operation Disposition to ICU stable  Indications: This is an 79 year old female who was noted to have some symptoms of a partial obstruction as well as bright red blood per rectum. She underwent a colonoscopy that showed a sigmoid colon mass and biopsy proved this to be adenocarcinoma. She is having a lot of leakage of blood, mucus, and stool that she is unable to control from her anus right now. She underwent a CT scan which showed a possible liver lesion as well as the diaphragm lesion. The liver lesion underwent a biopsy which was benign. She and I discussed multiple options for her surgery that are documented in my notes and decided to proceed with a colectomy and a colostomy. We discussed re anastomosis with her difficulty with control and she decided against that.  Procedure: After informed consent was obtained the patient was taken to the operating room. She was administered Ancef and Flagyl. Sequential compression devices were placed on her legs. She was then placed under general endotracheal anesthesia without complication. A Foley catheter and orogastric tube were placed. She was placed in lithotomy position and appropriately padded. Her abdomen was then prepped and draped in the standard sterile surgical fashion. A surgical timeout was performed.  Her abdomen had previously been marked for a colostomy. I then infiltrated Marcaine in her left upper quadrant. I made an incision and entered into her abdomen using the Optiview technique without any difficulty. Her abdomen was then insufflated to  15 mmHg pressure. I then inserted 3 further trocars in the midline as well as the right lower quadrant. 2 of these were 5 mm trocars and the other was a 12 mm trocar. I then was able to identify her sigmoid colon. This mass was in the distal sigmoid colon and it was very free. I chose a point at the top of the rectum. I encircled the colon there. I inserted an echelon stapler and divided the colon. I then divided the mesentery using the harmonic scalpel as well as some clips all the way up to the tumor. I went proximal to the tumor and then had this entire area free. I decided to try to remove this through my colostomy site. Once I completed this I then opened the side of the marked for the colostomy. I entered into the abdomen and brought the colon out. I enlarged this just a little bit from what I had made initially and I was able to remove the tumor as well as go well proximal to the lesion. I then divided the remainder of the mesentery. I oversewed an area that I used the vascular stapler on this well. I then matured the colostomy with 3-0 undyed Vicryl. I marked the specimen with a stitch distal. The colostomy was healthy and viable.  I then reinsufflated the abdomen. I made it an exhaustive search to see if there were any other lesions either in the peritoneum, liver, or remainder of the bowel and I could not find anything. There was no evidence of metastatic disease. There was evidence of her prior liver biopsy with a little bit of a hematoma over her liver. I then removed my 12 mm trocar  and used the Endo Close device and a 2-0 Vicryl suture to close this with 2 sutures. Upon completion everything looked hemostatic a my colostomy looked well intraperitoneally also. I then desufflated the abdomen and remove my trocars. Then close using scissors with 4-0 Monocryl and Dermabond. She had some ectopy and was retained carbon dioxide while I had her insufflated. Due to this I will have her transferred to the ICU  for monitoring overnight.

## 2012-09-20 NOTE — Progress Notes (Addendum)
79 yo female admitted from PACU post-op after laparoscopic sigmoid colectomy and placement of end descending colostomy secondary to sigmoid colon cancer. Pt extubated prior to arrival on unit, drowsy, but able to nod appropriately in response to questions. Morphine PCA turned off at 1515 due to decreased respiratory rate, placed on 100% non-rebreather O2 due to mouth breathing. BP stable off pressors. Receiving NS @ 100 ml/hr via peripheral IV, 40 Meq KCL iv over 4 hours for K+3.2, IV abx given. Abdominal dressings CDI, new colostomy pink with minimal bloody drainage. Updates to daughter in law at bedside. Continue evaluation.

## 2012-09-20 NOTE — H&P (View-Only) (Signed)
Patient ID: Erin Hansen, female   DOB: 10/16/1925, 79 y.o.   MRN: [id number]  Chief Complaint  Patient presents with  . Colon Cancer    Sigmoid    HPI Erin Hansen is a 79 y.o. female.  Referred by Dr. Dulce Sellar  HPI This is an 79 year old female with a history of some irritable bowel syndrome. Since August she has noticed some generalized abdominal discomfort as well as some weakness. She had had diarrhea in August she treated with Imodium. She also noted that she was nauseated and that she was sleeping a lot more. About 2 months ago she also noted some hematochezia. She was a evaluated by her primary care physician and then was referred to see gastroenterology. She also describes leaking some blood and mucus. She is wearing a diaper now due to the fact that she has considerable leakage and is not really able to control her bowel movements or leakage of blood or mucus from her anus right now. This is keeping her basically in her room at her assisted living facility at [facility]. She prior to this is a very active lady who drove herself to her ct scan recently.She has used lactulose for about 20 years and over the last week she did use some lactulose with a return of a bowel movement. She is not having normal bowel movements at all right now although it does complain of some increased bloating and abdominal issues. She has lost 15 pounds since all this began but she described no change in her appetite. She was evaluated by Dr. Dulce Sellar and underwent a colonoscopy. The colonoscopy showed a few medium-sized diverticuli in the sigmoid colon and the descending colon. There was a partially obstructing ulcerated mass found in the sigmoid colon. This involved about two thirds of the lumen. This was injected with [place] ink. A pediatric and an upper endoscope were both used to try to reach the cecum unsuccessfully. Dr. Dulce Sellar was able to get about to the mid transverse colon and was able to traverse the lesion.  She underwent a biopsy of this area which shows this to be positive for adenocarcinoma. Since then she has undergone a CT of her chest which shows no evidence of any metastatic disease and history of her T12 compression fracture. A CT of her abdomen and pelvis shows a lobular soft tissue mass in the rectosigmoid region measuring 5.6 x 3 x 3.8 cm with no definitive evidence of local extension or adenopathy. There is a moderate amount of feces proximal to this as well. There is also an 11 mm low-attenuation structure in the mid right lobe of the liver that does not appear to be a cyst as well as a small soft tissue nodule in the right upper quadrant posterior to the liver which could potentially be a peritoneal implant. She comes in today to discuss her options.  Past Medical History  Diagnosis Date  . DJD (degenerative joint disease)   . Compression fracture     t12  . IBS (irritable bowel syndrome)   . Vaginal prolapse   . TMJ (temporomandibular joint disorder)   . Anemia   . Cancer     skin  . Hearing loss   . Cough   . Constipation   . Weakness   . Rectal bleeding   . Blood in stool     Past Surgical History  Procedure Date  . Appendectomy   . Total abdominal hysterectomy   . Vaginal prolapse  repair 2005 - approximate  . Cystectomy     back  . Pelvic fracture surgery     reconstruction  . Nasal reconstruction with septal repair   . Skin cancer excision   . Hernia repair 1950 - approximate    History reviewed. No pertinent family history.  Social History History  Substance Use Topics  . Smoking status: Never Smoker   . Smokeless tobacco: Not on file  . Alcohol Use: No    Allergies  Allergen Reactions  . Codeine Nausea Only    Current Outpatient Prescriptions  Medication Sig Dispense Refill  . acetaminophen (TYLENOL) 500 MG tablet Take 500 mg by mouth every 6 (six) hours as needed.      . calcium carbonate (TUMS - DOSED IN MG ELEMENTAL CALCIUM) 500 MG chewable  tablet Chew 1 tablet by mouth daily.      . cholecalciferol (VITAMIN D) 1000 UNITS tablet Take 1,000 Units by mouth daily.      Marland Kitchen docusate sodium (COLACE) 100 MG capsule Take 100 mg by mouth as needed.       . ferrous sulfate 325 (65 FE) MG tablet Take 325 mg by mouth daily with breakfast.      . hydrochlorothiazide (HYDRODIURIL) 25 MG tablet Take 25 mg by mouth daily.      Marland Kitchen lactulose (CEPHULAC) 10 G packet Take 10 g by mouth 3 (three) times daily.      . psyllium (METAMUCIL) 58.6 % powder Take 1 packet by mouth 3 (three) times daily.      Marland Kitchen pyridOXINE (VITAMIN B-6) 100 MG tablet Take 100 mg by mouth daily.      . sertraline (ZOLOFT) 25 MG tablet Take 25 mg by mouth daily. Patient takes 1/2 tablet per dosage.      . vitamin C (ASCORBIC ACID) 500 MG tablet Take 500 mg by mouth daily.        Review of Systems Review of Systems  Constitutional: Negative for fever, chills and unexpected weight change.  HENT: Positive for hearing loss. Negative for congestion, sore throat, trouble swallowing and voice change.   Eyes: Negative for visual disturbance.  Respiratory: Positive for cough. Negative for wheezing.   Cardiovascular: Negative for chest pain, palpitations and leg swelling.  Gastrointestinal: Positive for constipation and blood in stool. Negative for nausea, vomiting, abdominal pain, diarrhea, abdominal distention and anal bleeding.  Genitourinary: Negative for hematuria, vaginal bleeding and difficulty urinating.  Musculoskeletal: Negative for arthralgias.  Skin: Negative for rash and wound.  Neurological: Positive for weakness. Negative for seizures, syncope and headaches.  Hematological: Negative for adenopathy. Does not bruise/bleed easily.  Psychiatric/Behavioral: Negative for confusion.    Blood pressure 138/70, pulse 76, temperature 97.8 F (36.6 C), temperature source Temporal, resp. rate 18, height 5\' 7"  (1.702 m), weight 152 lb 2 oz (69.003 kg).  Physical Exam Physical Exam    Vitals reviewed. Constitutional: She appears well-developed and well-nourished.  Eyes: No scleral icterus.  Neck: Neck supple.  Cardiovascular: Normal rate, regular rhythm and normal heart sounds.   Pulmonary/Chest: Effort normal and breath sounds normal. She has no wheezes. She has no rales.  Abdominal: Soft. Bowel sounds are normal. There is no tenderness. No hernia. Hernia confirmed negative in the ventral area, confirmed negative in the right inguinal area and confirmed negative in the left inguinal area.    Genitourinary: Rectal exam shows external hemorrhoid and tenderness. Guaiac positive stool (gross blood seen).  Lymphadenopathy:    She has no cervical adenopathy.  Data Reviewed CT CHEST, ABDOMEN AND PELVIS WITH CONTRAST  Technique: Multidetector CT imaging of the chest, abdomen and  pelvis was performed following the standard protocol during bolus  administration of intravenous contrast.  Contrast: OMNIPAQUE IOHEXOL 300 MG/ML SOLN,  Comparison: None.  CT CHEST  Findings: On the lung window images, no lung metastasis are seen.  No infiltrate or effusion is noted.  On soft tissue window images, the thyroid gland is somewhat  asymmetric right lobe larger than left but no suspicious nodule or  mass is seen. The thoracic aorta opacifies with no acute  abnormality and the origins of the great vessels are patent. The  pulmonary arteries opacify with no acute abnormality noted. There  is mild cardiomegaly noted. A small hiatal hernia is present. No  acute bony abnormality is seen and no lytic or blastic lesion is  noted throughout the thoracic spine. There is a compression  deformity of T12 vertebral body with apparent vertebroplasty at  that level and some retropulsion.  IMPRESSION:  1. No evidence of metastatic involvement of the lungs.  2. Small hiatal hernia.  3. Compressed T12 vertebral body with vertebroplasty and  retropulsion at that level.  CT ABDOMEN AND  PELVIS  Findings: Within the right lobe of liver laterally there is an 11  mm low attenuation poorly defined structure present. This is not a  cyst and this area appears to persist on delayed images. A hepatic  metastatic lesion cannot be excluded. A tiny sub centimeter  structure is noted in the anterior aspect of the medial segment of  the left lobe of questionable significance. No ductal dilatation  is seen. No calcified gallstones are noted. Within the right upper  quadrant posterior to the liver and abutting the hemidiaphragm,  there is a 6 x 12 mm soft tissue nodule present. A peritoneal  metastatic implant cannot be excluded. The pancreas is normal in  size and the pancreatic duct is not dilated. The adrenal glands  and spleen are unremarkable although there are several calcified  splenic granulomas present most likely due to prior granulomatous  disease. The stomach is fluid distended with no abnormality noted.  The kidneys enhance and there do appear to be small nonobstructing  left renal calculi present. The abdominal aorta is normal in  caliber with moderate atheromatous change present. No adenopathy  is seen.  The urinary bladder is unremarkable. The uterus appears to have  previously been resected. No adnexal lesion is seen.  However, within the left pelvis there is an abnormal soft tissue  mass which appears to involve the rectosigmoid colon, measuring  approximately 5.6 x 3.0 x 3.8 cm, consistent with the recently  diagnosed carcinoma. No definite evidence of local extension of  tumor is seen. No adenopathy is noted. There is a moderate amount  of feces throughout the more proximal colon. The terminal ileum is  unremarkable. Diffuse degenerative disc disease is present  throughout the lumbar spine.  IMPRESSION:  1. Lobular soft tissue mass in the rectosigmoid colon consistent  with the recently diagnosed rectosigmoid colon carcinoma. No  definite evidence of local  extension of tumor is seen and no  adenopathy is noted.  2. 11 mm low attenuation structure in the mid right lobe of liver.  Cannot exclude a single liver metastasis. A tiny sub centimeter  low attenuation structure in the left lobe is also noted of  uncertain significance.  3. Small soft tissue nodule in the right upper quadrant posterior  to the liver. Cannot exclude peritoneal implant of tumor.  Original Report Authenticated By: Dwyane Dee, M.D.   Assessment    Colon cancer    Plan    We spent about 45 minutes discussing her new diagnosis. She does have a symptomatic sigmoid colon cancer. She does have bleeding and I think it is causing her some obstructive symptoms. I am concerned that she also has stage IV disease given her CT scan as well as some of her symptoms. I discussed with her proceeding with a biopsy of the liver lesion by interventional radiology and an evaluation by medical oncology next week. I think she will still need surgery and I discussed with her a laparoscopic possible open sigmoid colectomy. I think due to the size of her tumor she is going to have a incision regardless just to remove this. We discussed for a prolonged time the impact this will have on her current health status as well as her living status. I don't know that she is going to return to her functional level after this all either. Am also concerned with her ability to control her bowel movements and her leakage she currently has. When I examined her today to do a rectal exam she has some significant external hemorrhoids as well as a diaper that was full of stool, blood, and mucus. She right now cannot really leave her room friend's home due to that consistent leakage she has. I told her that I think the best option for her given her diagnosis as well as her quality of life afterwards would be a colostomy. I we had a long discussion about her ability to do things in participate outside of her room with a  colostomy. She initially was concerned about this but after long discussion at the she is agreeable to this. I will plan on a colectomy with a colostomy in 2 weeks after the other 2 things have been done. I will plan on seeing her again later next week prior to proceeding with surgery I discussed this with she and her friend who was present and is a Engineer, civil (consulting). He seems satisfied with this and will plan to see her next week prior to doing surgery. We had a long discussion about the risks of surgery including bleeding, infection, open wound, hernia, DVT, PE, cardiac complications, abscess, reoperation, as well as the long-term effects this is have       Lorelle Macaluso 09/09/2012, 3:35 PM

## 2012-09-20 NOTE — Transfer of Care (Signed)
Immediate Anesthesia Transfer of Care Note  Patient: Erin Hansen  Procedure(s) Performed: Procedure(s) (LRB) with comments: LAPAROSCOPIC SIGMOID COLECTOMY (N/A) - Laparoscopic possible Open Sigmoid Colectomy,Colostomy COLOSTOMY (N/A)  Patient Location: PACU  Anesthesia Type:General  Level of Consciousness: awake, alert , oriented and patient cooperative  Airway & Oxygen Therapy: Patient Spontanous Breathing and Patient connected to face mask oxygen  Post-op Assessment: Report given to PACU RN, Post -op Vital signs reviewed and stable and Patient moving all extremities X 4  Post vital signs: stable  Complications: No apparent anesthesia complications

## 2012-09-20 NOTE — Anesthesia Postprocedure Evaluation (Signed)
  Anesthesia Post-op Note  Patient: Erin Hansen  Procedure(s) Performed: Procedure(s) (LRB): LAPAROSCOPIC SIGMOID COLECTOMY (N/A) COLOSTOMY (N/A)  Patient Location: PACU  Anesthesia Type: General  Level of Consciousness: awake and alert   Airway and Oxygen Therapy: Patient Spontanous Breathing  Post-op Pain: mild  Post-op Assessment: Post-op Vital signs reviewed, Patient's Cardiovascular Status Stable, Respiratory Function Stable, Patent Airway and No signs of Nausea or vomiting  Last Vitals:  Filed Vitals:   09/20/12 1315  BP: 147/56  Pulse: 63  Temp:   Resp: 15    Post-op Vital Signs: stable   Complications: No apparent anesthesia complications

## 2012-09-20 NOTE — Interval H&P Note (Signed)
History and Physical Interval Note:  09/20/2012 8:58 AM  Erin Hansen  has presented today for surgery, with the diagnosis of sigmoid cancer  The various methods of treatment have been discussed with the patient and family. After consideration of risks, benefits and other options for treatment, the patient has consented to  Procedure(s) (LRB) with comments: LAPAROSCOPIC SIGMOID COLECTOMY (N/A) - Laparoscopic possible Open Sigmoid Colectomy,Colostomy COLOSTOMY (N/A) as a surgical intervention .  The patient's history has been reviewed, patient examined, no change in status, stable for surgery.  I have reviewed the patient's chart and labs.  Questions were answered to the patient's satisfaction.     Tyquez Hollibaugh

## 2012-09-20 NOTE — Anesthesia Preprocedure Evaluation (Addendum)
Anesthesia Evaluation  Patient identified by MRN, date of birth, ID band Patient awake    Reviewed: Allergy & Precautions, H&P , NPO status , Patient's Chart, lab work & pertinent test results  Airway Mallampati: II TM Distance: >3 FB Neck ROM: full    Dental No notable dental hx. (+) Teeth Intact and Dental Advisory Given   Pulmonary neg pulmonary ROS,  breath sounds clear to auscultation  Pulmonary exam normal       Cardiovascular Exercise Tolerance: Good negative cardio ROS  Rhythm:regular Rate:Normal     Neuro/Psych negative neurological ROS  negative psych ROS   GI/Hepatic negative GI ROS, Neg liver ROS,   Endo/Other  negative endocrine ROS  Renal/GU negative Renal ROS  negative genitourinary   Musculoskeletal   Abdominal   Peds  Hematology negative hematology ROS (+)   Anesthesia Other Findings   Reproductive/Obstetrics negative OB ROS                          Anesthesia Physical Anesthesia Plan  ASA: II  Anesthesia Plan: General   Post-op Pain Management:    Induction: Intravenous  Airway Management Planned: Oral ETT  Additional Equipment:   Intra-op Plan:   Post-operative Plan: Extubation in OR  Informed Consent: I have reviewed the patients History and Physical, chart, labs and discussed the procedure including the risks, benefits and alternatives for the proposed anesthesia with the patient or authorized representative who has indicated his/her understanding and acceptance.   Dental Advisory Given  Plan Discussed with: CRNA and Surgeon  Anesthesia Plan Comments:       Anesthesia Quick Evaluation

## 2012-09-20 NOTE — Progress Notes (Signed)
Abd dressings x4 dry and intact,

## 2012-09-21 ENCOUNTER — Encounter (HOSPITAL_COMMUNITY): Payer: Self-pay | Admitting: General Surgery

## 2012-09-21 LAB — BASIC METABOLIC PANEL
BUN: 12 mg/dL (ref 6–23)
Chloride: 102 mEq/L (ref 96–112)
GFR calc Af Amer: >90 mL/min (ref >90)
GFR calc non Af Amer: 84 mL/min — ABNORMAL LOW (ref >90)
Potassium: 4.4 mEq/L (ref 3.5–5.1)
Sodium: 137 mEq/L (ref 135–145)

## 2012-09-21 LAB — CBC
HCT: 24.3 % — ABNORMAL LOW (ref 36.0–46.0)
Hemoglobin: 7.9 g/dL — ABNORMAL LOW (ref 12.0–15.0)
Hemoglobin: 7.5 g/dL — ABNORMAL LOW (ref 12.0–15.0)
MCH: 29.2 pg (ref 26.0–34.0)
MCHC: 32.5 g/dL (ref 30.0–36.0)
MCHC: 32.6 g/dL (ref 30.0–36.0)
MCV: 89.5 fL (ref 78.0–100.0)
RBC: 2.7 MIL/uL — ABNORMAL LOW (ref 3.87–5.11)
RBC: 2.57 MIL/uL — ABNORMAL LOW (ref 3.87–5.11)
WBC: 13.3 10*3/uL — ABNORMAL HIGH (ref 4.0–10.5)

## 2012-09-21 MED ORDER — BIOTENE DRY MOUTH MT LIQD
15.0000 mL | Freq: Two times a day (BID) | OROMUCOSAL | Status: DC
  Administered 2012-09-22: 15 mL via OROMUCOSAL

## 2012-09-21 MED ORDER — DEXTROSE-NACL 5-0.45 % IV SOLN
INTRAVENOUS | Status: DC
  Administered 2012-09-21: 22:00:00 via INTRAVENOUS
  Administered 2012-09-21 – 2012-09-22 (×2): 100 mL/h via INTRAVENOUS
  Administered 2012-09-22: 75 mL/h via INTRAVENOUS

## 2012-09-21 MED ORDER — SODIUM CHLORIDE 0.9 % IV BOLUS (SEPSIS)
1000.0000 mL | Freq: Once | INTRAVENOUS | Status: AC
  Administered 2012-09-21: 1000 mL via INTRAVENOUS

## 2012-09-21 MED ORDER — CHLORHEXIDINE GLUCONATE 0.12 % MT SOLN
15.0000 mL | Freq: Two times a day (BID) | OROMUCOSAL | Status: DC
  Administered 2012-09-22: 15 mL via OROMUCOSAL
  Filled 2012-09-21 (×2): qty 15

## 2012-09-21 NOTE — Clinical Documentation Improvement (Signed)
Anemia Blood Loss Clarification  THIS DOCUMENT IS NOT A PERMANENT PART OF THE MEDICAL RECORD  RESPOND TO THE THIS QUERY, FOLLOW THE INSTRUCTIONS BELOW:  1. If needed, update documentation for the patient's encounter via the notes activity.  2. Access this query again and click edit on the In [employer].  3. After updating, or not, click F2 to complete all highlighted (required) fields concerning your review. Select "additional documentation in the medical record" OR "no additional documentation provided".  4. Click Sign note button.  5. The deficiency will fall out of your In Basket *Please let us know if you are not able to complete this workflow by phone or e-mail (listed below).        09/21/12  Dear Dr. Dwain Sarna Marton [place]  In an effort to better capture your patient's severity of illness, reflect appropriate length of stay and utilization of resources, a review of the patient medical record has revealed the following indicators.    Based on your clinical judgment, please clarify and document in a progress note and/or discharge summary the clinical condition associated with the following supporting information:  In responding to this query please exercise your independent judgment.  The fact that a query is asked, does not imply that any particular answer is desired or expected.   Possible Clinical Conditions?   " Expected Acute Blood Loss Anemia  " Acute Blood Loss Anemia  " Acute on chronic blood loss anemia  " Other Condition  " Cannot Clinically Determine    Supporting Information:  Diagnostics: H&H on 01/15 :  7.5/23.0 H&H on 01/08:  10.8/32.6  Treatments: Plasma expanders: 1/14:  Hetastarch(hextend) 6% infusion x2 per [facility]   Reviewed: Additional documentation provided per 01/16 progress notes.                                    Thank You,  Marciano Sequin,  Clinical Documentation Specialist:  Pager: [phone]  Phone: [phone]  Health  Information Management [place]

## 2012-09-21 NOTE — Progress Notes (Signed)
[id number]/Rhonda Davis, RN, BSN, CCM:  CHART REVIEWED AND UPDATED.  Next chart review due on 01182014. NO DISCHARGE NEEDS PRESENT AT THIS TIME. CASE MANAGEMENT [phone] 

## 2012-09-21 NOTE — Progress Notes (Signed)
1 Day Post-Op  Subjective: Mild confusion this am, pain fair control, mild nausea  Objective: Vital signs in last 24 hours: Temp:  [97.2 F (36.2 C)-99.4 F (37.4 C)] 98.5 F (36.9 C) (01/15 0800) Pulse Rate:  [63-103] 82  (01/15 0600) Resp:  [5-24] 11  (01/15 0600) BP: (111-157)/(43-69) 121/53 mmHg (01/15 0600) SpO2:  [82 %-100 %] 90 % (01/15 0600) FiO2 (%):  [30 %-100 %] 30 % (01/14 2048) Weight:  [166 lb 3.6 oz (75.4 kg)] 166 lb 3.6 oz (75.4 kg) (01/14 1549)    Intake/Output from previous day: 01/14 0701 - 01/15 0700 In: 5050 [I.V.:4000; IV Piggyback:1050] Out: 785 [Urine:735; Blood:50] Intake/Output this shift:    General appearance: no distress Resp: diminished breath sounds bibasilar Cardio: regular rate and rhythm GI: stoma pink, few bs approp tender  Lab Results:   Basename 09/21/12 0340 09/20/12 1245  WBC 13.3* 10.7*  HGB 7.9* 8.2*  HCT 24.3* 24.7*  PLT 273 274   BMET  Basename 09/21/12 0340 09/20/12 1245  NA 137 136  K 4.4 3.2*  CL 102 101  CO2 29 25  GLUCOSE 139* 156*  BUN 12 16  CREATININE 0.54 0.50  CALCIUM 8.1* 8.4    Assessment/Plan: POD 1 lap sigmoid colectomy, colostomy  1. Neuro- cont reduced dose pca 2. CV/Pulm- aggressive pulm toilet 3. GI- sips/chips, await ileus to resolve, ostomy care consult today 4. Renal- uop adequate, k better, monitor uop, will leave foley in until tomorrow to monitor output and until up 5. PT consult 6. Sq heparin, scds 7. Stepdown status today, will tx to floor tomorrow if doing well   [facility] 09/21/2012

## 2012-09-21 NOTE — Progress Notes (Signed)
Dr. Dwain Sarna called to report increasing  level of confusion noted with pt.  Also reported that urine output was low over past 8 hrs. NS bolus 1 liter  initiated.Erin Hansen

## 2012-09-21 NOTE — Consult Note (Signed)
WOC ostomy consult  Patient confused and there is no ostomy output at this time. I will check in daily (M-F) and begin teaching when out of ICU.  I will change initial pouch tomorrow or Friday. Thanks, Ladona Mow, MSN, RN, [facility], CWOCN [phone])

## 2012-09-22 LAB — BASIC METABOLIC PANEL
BUN: 8 mg/dL (ref 6–23)
CO2: 29 mEq/L (ref 19–32)
Calcium: 8.1 mg/dL — ABNORMAL LOW (ref 8.4–10.5)
Creatinine, Ser: 0.54 mg/dL (ref 0.50–1.10)
GFR calc non Af Amer: 84 mL/min — ABNORMAL LOW (ref >90)
Glucose, Bld: 137 mg/dL — ABNORMAL HIGH (ref 70–99)

## 2012-09-22 LAB — CBC
MCH: 29.1 pg (ref 26.0–34.0)
MCHC: 32 g/dL (ref 30.0–36.0)
MCV: 90.9 fL (ref 78.0–100.0)
Platelets: 284 10*3/uL (ref 150–400)
RDW: 14 % (ref 11.5–15.5)

## 2012-09-22 MED ORDER — BIOTENE DRY MOUTH MT LIQD: 15.0000 mL | Freq: Two times a day (BID) | OROMUCOSAL | Status: DC

## 2012-09-22 NOTE — Progress Notes (Signed)
2 Days Post-Op  Subjective: Pain controlled, no nausea, up in chair, less confused right now  Objective: Vital signs in last 24 hours: Temp:  [98.2 F (36.8 C)-99.2 F (37.3 C)] 99.2 F (37.3 C) (01/16 0800) Pulse Rate:  [71-110] 86  (01/16 1200) Resp:  [11-23] 17  (01/16 1200) BP: (101-139)/(40-80) 114/66 mmHg (01/16 1200) SpO2:  [90 %-99 %] 95 % (01/16 1200) FiO2 (%):  [50 %] 50 % (01/16 0400) Weight:  [167 lb 1.7 oz (75.8 kg)] 167 lb 1.7 oz (75.8 kg) (01/16 0323)    Intake/Output from previous day: 01/15 0701 - 01/16 0700 In: 3360 [I.V.:2300; IV Piggyback:1060] Out: 535 [Urine:535] Intake/Output this shift: Total I/O In: 500 [I.V.:500] Out: 250 [Urine:250]  General appearance: no distress Resp: clear to auscultation bilaterally Cardio: regular rate and rhythm GI: approp tender, bs present, ostomy pink, wounds clean  Lab Results:   [facility] 09/22/12 0335 09/21/12 0922  WBC 10.8* 12.9*  HGB 7.4* 7.5*  HCT 23.1* 23.0*  PLT 284 277   BMET  Basename 09/22/12 0335 09/21/12 0340  NA 134* 137  K 3.9 4.4  CL 101 102  CO2 29 29  GLUCOSE 137* 139*  BUN 8 12  CREATININE 0.54 0.54  CALCIUM 8.1* 8.1*    Assessment/Plan: POD 2 lap sigmoid colectomy/colostomy 1. Neuro- cont pca until tolerating po 2. CV/Pulm-continue aggressive pulm toilet 3. GI- some clears today, await ileus to resolve 4. Renal- uop picking up today, cr stable, will decrease fluids, check bmet in am, will leave foley in place another day for monitoring as well as inability to get up well right now 5. Heme- acute on chronic blood loss anemia is stable, will check cbc in am 6. Scds, sq heparin 7. tx to floor today.  [facility] 09/22/2012

## 2012-09-22 NOTE — Progress Notes (Signed)
PT Cancellation Note  Patient Details Name: Erin Hansen MRN: [id number] DOB: 1926/07/12   Cancelled Treatment:    Reason Eval/Treat Not Completed:  (checked at 1200 and pt had been up and back to bed. will)   Rada Hay 09/22/2012, 5:42 PM

## 2012-09-23 LAB — BASIC METABOLIC PANEL
CO2: 31 mEq/L (ref 19–32)
Calcium: 8 mg/dL — ABNORMAL LOW (ref 8.4–10.5)
Creatinine, Ser: 0.47 mg/dL — ABNORMAL LOW (ref 0.50–1.10)
GFR calc Af Amer: >90 mL/min (ref >90)
GFR calc non Af Amer: 88 mL/min — ABNORMAL LOW (ref >90)

## 2012-09-23 LAB — CBC
MCV: 89.8 fL (ref 78.0–100.0)
Platelets: 285 10*3/uL (ref 150–400)
RDW: 13.9 % (ref 11.5–15.5)
WBC: 7.9 10*3/uL (ref 4.0–10.5)

## 2012-09-23 MED ORDER — FUROSEMIDE 10 MG/ML IJ SOLN
20.0000 mg | Freq: Once | INTRAMUSCULAR | Status: AC
  Administered 2012-09-23: 20 mg via INTRAVENOUS
  Filled 2012-09-23: qty 2

## 2012-09-23 MED ORDER — KCL IN DEXTROSE-NACL 20-5-0.45 MEQ/L-%-% IV SOLN
INTRAVENOUS | Status: DC
  Administered 2012-09-23: 10:00:00 via INTRAVENOUS
  Administered 2012-09-25: 1000 mL via INTRAVENOUS
  Administered 2012-09-25 – 2012-09-26 (×2): via INTRAVENOUS
  Filled 2012-09-23 (×5): qty 1000

## 2012-09-23 MED ORDER — POTASSIUM CHLORIDE 10 MEQ/100ML IV SOLN
10.0000 meq | INTRAVENOUS | Status: AC
  Administered 2012-09-23 (×4): 10 meq via INTRAVENOUS
  Filled 2012-09-23 (×4): qty 100

## 2012-09-23 NOTE — Progress Notes (Signed)
3 Days Post-Op  Subjective: Complains of pain when moving, no nausea/vomiting, up on floor now  Objective: Vital signs in last 24 hours: Temp:  [98.2 F (36.8 C)-98.9 F (37.2 C)] 98.9 F (37.2 C) (01/17 0535) Pulse Rate:  [86-96] 94  (01/17 0535) Resp:  [11-17] 12  (01/17 0729) BP: (114-165)/(50-74) 149/50 mmHg (01/17 0535) SpO2:  [93 %-98 %] 97 % (01/17 0729) FiO2 (%):  [32 %-36 %] 35 % (01/17 0535)    Intake/Output from previous day: 01/16 0701 - 01/17 0700 In: 1505.8 [I.V.:1505.8] Out: 2625 [Urine:2625] Intake/Output this shift:    General appearance: no distress Resp: diminished breath sounds bibasilar Cardio: regular rate and rhythm GI: some bs, ostomy pink , wounds clean, mild distention  Lab Results:   [facility] 09/23/12 0415 09/22/12 0335  WBC 7.9 10.8*  HGB 7.3* 7.4*  HCT 22.9* 23.1*  PLT 285 284   BMET  Basename 09/23/12 0415 09/22/12 0335  NA 138 134*  K 3.4* 3.9  CL 103 101  CO2 31 29  GLUCOSE 139* 137*  BUN 3* 8  CREATININE 0.47* 0.54  CALCIUM 8.0* 8.1*    Assessment/Plan: POD 3 lap sigmoid colectomy 1. Neuro- cont pca until taking po 2. CV/Pulm- pulm toilet 3. GI- await ileus resolve, some clears 4. Renal- will give dose of lasix today, dc foley later today, add k to fluids, replace k, check bmet in am 5. Heme- hct essentially the same and I think dilutional, will recheck in am, won't transfuse at this point, do not think she is having active blood loss 6. Scds, sq heparin  [facility] 09/23/2012

## 2012-09-23 NOTE — Consult Note (Signed)
WOC ostomy consult  Stoma type/location: LLQ Colostomy Stomal assessment/size: 1 and 3/8 inches round, budded, moist and edematous Peristomal assessment: intact, clear Treatment options for stomal/peristomal skin: none indicated Output none. Scant serosanguinous liquid in pouch Ostomy pouching: 2pc. 2 and 1/4 inch pouching system  Pouch #224, Skin Barrier 644 Education provided: session to describe stoma and pouch characteristics.  Daughter-in-law in room.  Patient alert, but not fully comprehending education.  Remembers interaction preop with WOC Nurse. We will continue to follow. Thanks, Ladona Mow, MSN, RN, [facility], CWOCN [phone])

## 2012-09-23 NOTE — Evaluation (Signed)
Physical Therapy Evaluation Patient Details Name: Erin Hansen MRN: [id number] DOB: 23-May-1926 Today's Date: 09/23/2012 Time: 4098-1191 PT Time Calculation (min): 28 min  PT Assessment / Plan / Recommendation Clinical Impression  79 yo female s/p lap colectomy, colostomy (L side). On eval pt required Min assist for all mobility. Demonstrates general weakness and decreased activity tolerance. Recommend SNF rehab at [facility] with transition back to Independent Living once appropriate. Encouraged pt to get OOB/ambulate with nursing over weekend.    PT Assessment  Patient needs continued PT services    Follow Up Recommendations  SNF    Does the patient have the potential to tolerate intense rehabilitation      Barriers to Discharge Decreased caregiver support      Equipment Recommendations  None recommended by PT    Recommendations for Other Services OT consult   Frequency Min 3X/week    Precautions / Restrictions Precautions Precaution Comments: abdominal surgery Restrictions Weight Bearing Restrictions: No   Pertinent Vitals/Pain Abdominal pain with activity-unrated; 0/10 pain at rest      Mobility  Bed Mobility Bed Mobility: Supine to Sit Supine to Sit: HOB elevated;With rails;4: Min assist Details for Bed Mobility Assistance: VCs safety, technique, hand placement. Assist for trunk to full upright. Increased time.  Transfers Transfers: Sit to Stand;Stand to Sit Sit to Stand: 4: Min assist;From bed;From elevated surface;With upper extremity assist;From toilet Stand to Sit: 4: Min assist;With upper extremity assist;With armrests;To chair/3-in-1;To toilet Details for Transfer Assistance: x 2. VCs safety, technique, hand placement. Assist to rsie, stabilize, control descent (especially to lower surfaces) Ambulation/Gait Ambulation/Gait Assistance: 4: Min assist Ambulation Distance (Feet): 65 Feet Assistive device: Rolling walker Ambulation/Gait Assistance Details:  VCs safety. Assist to stabilize and maneuver RW.  Gait Pattern: Step-through pattern;Decreased stride length    Shoulder Instructions     Exercises     PT Diagnosis: Difficulty walking;Generalized weakness;Acute pain  PT Problem List: Decreased strength;Decreased mobility;Decreased activity tolerance;Pain PT Treatment Interventions: Gait training;Functional mobility training;Therapeutic activities;Therapeutic exercise;Patient/family education   PT Goals Acute Rehab PT Goals PT Goal Formulation: With patient Time For Goal Achievement: 10/07/12 Potential to Achieve Goals: Good Pt will go Supine/Side to Sit: with supervision PT Goal: Supine/Side to Sit - Progress: Goal set today Pt will go Sit to Supine/Side: with supervision PT Goal: Sit to Supine/Side - Progress: Goal set today Pt will go Sit to Stand: with supervision PT Goal: Sit to Stand - Progress: Goal set today Pt will Ambulate: 51 - 150 feet;with supervision;with rolling walker PT Goal: Ambulate - Progress: Goal set today  Visit Information  Last PT Received On: 09/23/12 Assistance Needed: +1    Subjective Data  Subjective: "It hurts when I bend forward" Patient Stated Goal: Return to [facility]   Prior Functioning  Home Living Lives With: Alone Type of Home: Independent living facility ([facility]) Home Access: Level entry;Elevator (apt on 3rd floor) Home Layout: One level Home Adaptive Equipment: Walker - rolling;Straight cane Prior Function Level of Independence: Independent with assistive device(s);Needs assistance Needs Assistance: Meal Prep Meal Prep: Total Able to Take Stairs?: No Driving: No Communication Communication: HOH    Cognition  Overall Cognitive Status: Impaired Area of Impairment: Memory Arousal/Alertness: Awake/alert Orientation Level: Appears intact for tasks assessed Behavior During Session: [facility] for tasks performed    Extremity/Trunk Assessment Right Lower Extremity  Assessment RLE ROM/Strength/Tone: Deficits RLE ROM/Strength/Tone Deficits: Strength at least 4/5 with functional mobility Left Lower Extremity Assessment LLE ROM/Strength/Tone: Deficits LLE ROM/Strength/Tone Deficits: Strength  at least 4/5 with functional mobility   Balance    End of Session PT - End of Session Activity Tolerance: Patient tolerated treatment well Patient left: in chair;with call bell/phone within reach;with family/visitor present  GP     Rebeca Alert [facility] 09/23/2012, 4:46 PM [phone]

## 2012-09-24 LAB — CBC
HCT: 23.7 % — ABNORMAL LOW (ref 36.0–46.0)
Platelets: 316 10*3/uL (ref 150–400)
RDW: 13.8 % (ref 11.5–15.5)
WBC: 7.5 10*3/uL (ref 4.0–10.5)

## 2012-09-24 LAB — BASIC METABOLIC PANEL
Calcium: 8.2 mg/dL — ABNORMAL LOW (ref 8.4–10.5)
Chloride: 101 mEq/L (ref 96–112)
Creatinine, Ser: 0.47 mg/dL — ABNORMAL LOW (ref 0.50–1.10)
GFR calc Af Amer: >90 mL/min (ref >90)
Sodium: 139 mEq/L (ref 135–145)

## 2012-09-24 MED ORDER — LIP MEDEX EX OINT
TOPICAL_OINTMENT | CUTANEOUS | Status: DC | PRN
  Administered 2012-09-24: 1 via TOPICAL

## 2012-09-24 MED ORDER — OXYCODONE-ACETAMINOPHEN 5-325 MG PO TABS
1.0000 | ORAL_TABLET | ORAL | Status: DC | PRN
  Administered 2012-09-24 – 2012-09-27 (×8): 1 via ORAL
  Filled 2012-09-24 (×8): qty 1

## 2012-09-24 MED ORDER — LIP MEDEX EX OINT
TOPICAL_OINTMENT | CUTANEOUS | Status: AC
  Administered 2012-09-24: 1 via TOPICAL
  Filled 2012-09-24: qty 7

## 2012-09-24 NOTE — Progress Notes (Signed)
Patient ID: Erin Hansen, female   DOB: 10-13-1925, 79 y.o.   MRN: [id number] 4 Days Post-Op  Subjective: Some abd pain but gradually better. OOB Tol CL without nausea.  Family says doing much better today  Objective: Vital signs in last 24 hours: Temp:  [97.9 F (36.6 C)-98.3 F (36.8 C)] 97.9 F (36.6 C) (01/18 0601) Pulse Rate:  [88-91] 91  (01/18 0601) Resp:  [12-20] 18  (01/18 0601) BP: (139-165)/(70-78) 148/76 mmHg (01/18 0601) SpO2:  [95 %-99 %] 99 % (01/18 0601)    Intake/Output from previous day: 01/17 0701 - 01/18 0700 In: 1952 [P.O.:360; I.V.:1290; IV Piggyback:302] Out: 2225 [Urine:2225] Intake/Output this shift: Total I/O In: 360 [P.O.:360] Out: 600 [Urine:600]  General appearance: alert, cooperative and no distress GI: normal findings: soft, non-tender Incision/Wound: Clean.  Colostomy with gas output  Lab Results:   Path T3N1 1/15 nodes +.  Discussed with pt and family  Basename 09/24/12 0437 09/23/12 0415  WBC 7.5 7.9  HGB 7.4* 7.3*  HCT 23.7* 22.9*  PLT 316 285   BMET  Basename 09/24/12 0437 09/23/12 0415  NA 139 138  K 3.4* 3.4*  CL 101 103  CO2 33* 31  GLUCOSE 118* 139*  BUN 4* 3*  CREATININE 0.47* 0.47*  CALCIUM 8.2* 8.0*     Studies/Results: No results found.  Anti-infectives: Anti-infectives     Start     Dose/Rate Route Frequency Ordered Stop   09/20/12 2300   ceFAZolin (ANCEF) IVPB 1 g/50 mL premix        1 g 100 mL/hr over 30 Minutes Intravenous 3 times per day 09/20/12 1515 09/21/12 2159   09/20/12 1600   metroNIDAZOLE (FLAGYL) IVPB 500 mg        500 mg 100 mL/hr over 60 Minutes Intravenous Every 8 hours 09/20/12 1515 09/21/12 0104   09/20/12 1330   metroNIDAZOLE (FLAGYL) IVPB 500 mg  Status:  Discontinued        500 mg 100 mL/hr over 60 Minutes Intravenous  Once 09/20/12 1321 09/20/12 1404   09/20/12 0742   ceFAZolin (ANCEF) IVPB 2 g/50 mL premix  Status:  Discontinued        2 g 100 mL/hr over 30 Minutes Intravenous  30 min pre-op 09/20/12 0742 09/20/12 1530   09/20/12 0742   ceFAZolin (ANCEF) IVPB 2 g/50 mL premix        2 g 100 mL/hr over 30 Minutes Intravenous On call to O.R. 09/20/12 0742 09/20/12 1000          Assessment/Plan: s/p Procedure(s): LAPAROSCOPIC SIGMOID COLECTOMY COLOSTOMY Doing well Blood loss anemia-stable-repeat CT in AM Try oral pain meds FL diet   LOS: 4 days    Erin Hansen T 09/24/2012

## 2012-09-25 LAB — CBC
Hemoglobin: 7.2 g/dL — ABNORMAL LOW (ref 12.0–15.0)
RBC: 2.54 MIL/uL — ABNORMAL LOW (ref 3.87–5.11)

## 2012-09-25 NOTE — Progress Notes (Signed)
Patient ID: Erin Hansen, female   DOB: 03-02-1926, 79 y.o.   MRN: [id number] 5 Days Post-Op  Subjective: abd pain better with PO meds. OOB Tol Fulls without nausea.    Objective: Vital signs in last 24 hours: Temp:  [98.1 F (36.7 C)-98.5 F (36.9 C)] 98.1 F (36.7 C) (01/19 0545) Pulse Rate:  [71-76] 72  (01/19 0545) Resp:  [16-18] 16  (01/19 0545) BP: (124-131)/(73-80) 124/73 mmHg (01/19 0545) SpO2:  [95 %-100 %] 99 % (01/19 0545)    Intake/Output from previous day: 01/18 0701 - 01/19 0700 In: 1680 [P.O.:480; I.V.:1200] Out: 1000 [Urine:1000] Intake/Output this shift: Total I/O In: 200 [I.V.:200] Out: -   General appearance: alert, cooperative and no distress GI: normal findings: soft, non-tender Incision/Wound: Clean.  Colostomy with gas output and small amt of stool  Lab Results:   Path T3N1 1/15 nodes +.  Discussed with pt and family  Basename 09/25/12 0428 09/24/12 0437  WBC 6.7 7.5  HGB 7.2* 7.4*  HCT 22.8* 23.7*  PLT 312 316   BMET  Basename 09/24/12 0437 09/23/12 0415  NA 139 138  K 3.4* 3.4*  CL 101 103  CO2 33* 31  GLUCOSE 118* 139*  BUN 4* 3*  CREATININE 0.47* 0.47*  CALCIUM 8.2* 8.0*     Studies/Results: No results found.  Anti-infectives: Anti-infectives     Start     Dose/Rate Route Frequency Ordered Stop   09/20/12 2300   ceFAZolin (ANCEF) IVPB 1 g/50 mL premix        1 g 100 mL/hr over 30 Minutes Intravenous 3 times per day 09/20/12 1515 09/21/12 2159   09/20/12 1600   metroNIDAZOLE (FLAGYL) IVPB 500 mg        500 mg 100 mL/hr over 60 Minutes Intravenous Every 8 hours 09/20/12 1515 09/21/12 0104   09/20/12 1330   metroNIDAZOLE (FLAGYL) IVPB 500 mg  Status:  Discontinued        500 mg 100 mL/hr over 60 Minutes Intravenous  Once 09/20/12 1321 09/20/12 1404   09/20/12 0742   ceFAZolin (ANCEF) IVPB 2 g/50 mL premix  Status:  Discontinued        2 g 100 mL/hr over 30 Minutes Intravenous 30 min pre-op 09/20/12 0742 09/20/12 1530   09/20/12 0742   ceFAZolin (ANCEF) IVPB 2 g/50 mL premix        2 g 100 mL/hr over 30 Minutes Intravenous On call to O.R. 09/20/12 0742 09/20/12 1000          Assessment/Plan: s/p Procedure(s): LAPAROSCOPIC SIGMOID COLECTOMY COLOSTOMY Doing well Blood loss anemia-stable Cont oral pain meds Cont FL diet   LOS: 5 days    Erin Bruhl C. 09/25/2012

## 2012-09-25 NOTE — Progress Notes (Signed)
Pt had not used PCA all night. Pt tried 1 percocet for pain last night and said that this worked great for her. Notified MD this am and MD ordered that PCA could be discontinued. Erin Hansen

## 2012-09-25 NOTE — Progress Notes (Signed)
Clinical Social Work Department BRIEF PSYCHOSOCIAL ASSESSMENT 09/25/2012  Patient:  Erin Hansen, Erin Hansen     Account Number:  [id number]     Admit date:  09/20/2012  Clinical Social Worker:  Leron Croak, CLINICAL SOCIAL WORKER  Date/Time:  09/25/2012 10:01 AM  Referred by:  Physician  Date Referred:  09/23/2012 Referred for  SNF Placement   Other Referral:   Interview type:  Patient Other interview type:    PSYCHOSOCIAL DATA Living Status:  FACILITY Admitted from facility[facility] Level of care:  Assisted Living Primary support name:  Erlinda Hong Primary support relationship to patient:  FRIEND Degree of support available:   Pt has a good support system through her friends and community    CURRENT CONCERNS Current Concerns  Post-Acute Placement   Other Concerns:    SOCIAL WORK ASSESSMENT / PLAN CSW met with the Pt at the bedside to discuss d/c planning to a SNF for rehab. Pt stated that she is from [facility] and would like to do her rehab at that facility. Pt stated that she did speak with Ainsley Spinner about the possibility and Pt stated that if a bed is available, then the facility would be able to accomidate. If facility is unable to provide a SNF placement, Pt has given permission to look for additional options in the [place] area. CSW will search for SNF placement at all facilities and Pt will Only complete rehab outside of her facility  if [facility]  is unable to [employer].   Assessment/plan status:  Information/Referral to [facility] Other assessment/ plan:   Information/referral to community resources:   CSW provided a listing of the SNF placement options for rehab and explained process for Pt.    PATIENT'S/FAMILY'S RESPONSE TO PLAN OF CARE: Pt was appreciative for assistance and would prefer to complete rehab at [facility].        Leron Croak, Theresia Majors [place] Weekend Coverage [phone]

## 2012-09-26 MED ORDER — DOCUSATE SODIUM 100 MG PO CAPS
100.0000 mg | ORAL_CAPSULE | Freq: Two times a day (BID) | ORAL | Status: DC
  Administered 2012-09-26 – 2012-09-27 (×3): 100 mg via ORAL
  Filled 2012-09-26 (×4): qty 1

## 2012-09-26 MED ORDER — HYDROCHLOROTHIAZIDE 25 MG PO TABS
25.0000 mg | ORAL_TABLET | Freq: Every morning | ORAL | Status: DC
  Administered 2012-09-26 – 2012-09-27 (×2): 25 mg via ORAL
  Filled 2012-09-26 (×2): qty 1

## 2012-09-26 MED ORDER — LACTULOSE 10 GM/15ML PO SOLN
40.0000 g | Freq: Every day | ORAL | Status: DC
  Administered 2012-09-26: 40 g via ORAL
  Filled 2012-09-26 (×2): qty 60

## 2012-09-26 MED ORDER — POTASSIUM CHLORIDE CRYS ER 20 MEQ PO TBCR
20.0000 meq | EXTENDED_RELEASE_TABLET | Freq: Every day | ORAL | Status: DC
  Administered 2012-09-26 – 2012-09-27 (×2): 20 meq via ORAL
  Filled 2012-09-26 (×2): qty 1

## 2012-09-26 NOTE — Progress Notes (Signed)
CSW assisting with d/c planning. Pt has SNF bed at [facility] tomorrow if stable for d/c. Pt is aware and in agreement with d/c plan. CSW will follow to assist with d/c planning to SNF.  Cori Razor LCSW [phone]

## 2012-09-26 NOTE — Progress Notes (Signed)
6 Days Post-Op  Subjective: Feels tired but well, no n/v, tol fulls  Objective: Vital signs in last 24 hours: Temp:  [98.1 F (36.7 C)-98.7 F (37.1 C)] 98.1 F (36.7 C) (01/20 0544) Pulse Rate:  [80-96] 94  (01/20 0544) Resp:  [16-18] 16  (01/20 0544) BP: (93-149)/(55-70) 149/60 mmHg (01/20 0544) SpO2:  [93 %-95 %] 95 % (01/20 0544) Last BM Date: 09/25/12  Intake/Output from previous day: 01/19 0701 - 01/20 0700 In: 1680 [P.O.:480; I.V.:1200] Out: 1600 [Urine:1600] Intake/Output this shift:    General appearance: no distress Resp: clear to auscultation bilaterally Cardio: regular rate and rhythm GI: soft, ostomy with some gas and stool, min tender, nondistended bs present wounds clean  Lab Results:   [facility] 09/25/12 0428 09/24/12 0437  WBC 6.7 7.5  HGB 7.2* 7.4*  HCT 22.8* 23.7*  PLT 312 316   BMET  Basename 09/24/12 0437  NA 139  K 3.4*  CL 101  CO2 33*  GLUCOSE 118*  BUN 4*  CREATININE 0.47*  CALCIUM 8.2*    Assessment/Plan: POD 6  Po pain meds Discussed path today, will get f/u as outpt with Dr Truett Perna Decrease ivf Advance diet If does well may be discharged tomorrow  [facility] 09/26/2012

## 2012-09-27 DIAGNOSIS — E876 Hypokalemia: Secondary | ICD-10-CM

## 2012-09-27 DIAGNOSIS — M6281 Muscle weakness (generalized): Secondary | ICD-10-CM

## 2012-09-27 DIAGNOSIS — IMO0002 Reserved for concepts with insufficient information to code with codable children: Secondary | ICD-10-CM

## 2012-09-27 DIAGNOSIS — N6019 Diffuse cystic mastopathy of unspecified breast: Secondary | ICD-10-CM

## 2012-09-27 DIAGNOSIS — E559 Vitamin D deficiency, unspecified: Secondary | ICD-10-CM

## 2012-09-27 DIAGNOSIS — F329 Major depressive disorder, single episode, unspecified: Secondary | ICD-10-CM

## 2012-09-27 DIAGNOSIS — Z933 Colostomy status: Secondary | ICD-10-CM

## 2012-09-27 DIAGNOSIS — H01009 Unspecified blepharitis unspecified eye, unspecified eyelid: Secondary | ICD-10-CM

## 2012-09-27 DIAGNOSIS — R609 Edema, unspecified: Secondary | ICD-10-CM

## 2012-09-27 DIAGNOSIS — R259 Unspecified abnormal involuntary movements: Secondary | ICD-10-CM

## 2012-09-27 DIAGNOSIS — C189 Malignant neoplasm of colon, unspecified: Secondary | ICD-10-CM

## 2012-09-27 DIAGNOSIS — R32 Unspecified urinary incontinence: Secondary | ICD-10-CM

## 2012-09-27 DIAGNOSIS — D376 Neoplasm of uncertain behavior of liver, gallbladder and bile ducts: Secondary | ICD-10-CM

## 2012-09-27 HISTORY — DX: Vitamin D deficiency, unspecified: E55.9

## 2012-09-27 HISTORY — DX: Unspecified urinary incontinence: R32

## 2012-09-27 HISTORY — DX: Unspecified abnormal involuntary movements: R25.9

## 2012-09-27 HISTORY — DX: Hypokalemia: E87.6

## 2012-09-27 HISTORY — DX: Diffuse cystic mastopathy of unspecified breast: N60.19

## 2012-09-27 HISTORY — DX: Muscle weakness (generalized): M62.81

## 2012-09-27 HISTORY — DX: Malignant neoplasm of colon, unspecified: C18.9

## 2012-09-27 HISTORY — DX: Reserved for concepts with insufficient information to code with codable children: IMO0002

## 2012-09-27 HISTORY — DX: Unspecified blepharitis unspecified eye, unspecified eyelid: H01.009

## 2012-09-27 HISTORY — DX: Colostomy status: Z93.3

## 2012-09-27 HISTORY — DX: Edema, unspecified: R60.9

## 2012-09-27 HISTORY — DX: Neoplasm of uncertain behavior of liver, gallbladder and bile ducts: D37.6

## 2012-09-27 HISTORY — DX: Major depressive disorder, single episode, unspecified: F32.9

## 2012-09-27 LAB — BASIC METABOLIC PANEL
BUN: 5 mg/dL — ABNORMAL LOW (ref 6–23)
Chloride: 104 mEq/L (ref 96–112)
GFR calc Af Amer: >90 mL/min (ref >90)
Potassium: 3.7 mEq/L (ref 3.5–5.1)
Sodium: 140 mEq/L (ref 135–145)

## 2012-09-27 MED ORDER — OXYCODONE-ACETAMINOPHEN 5-325 MG PO TABS: 1.0000 | ORAL_TABLET | ORAL | Status: DC | PRN

## 2012-09-27 NOTE — Care Management Note (Signed)
    Page 1 of 1   09/27/2012     10:48:52 AM   CARE MANAGEMENT NOTE 09/27/2012  Patient:  Erin Hansen, Erin Hansen   Account Number:  [id number]  Date Initiated:  09/21/2012  Documentation initiated by:  DAVIS,RHONDA  Subjective/Objective Assessment:   pt s/p or with resp depression and decreased urinary outpt     Action/Plan:   from home   Anticipated DC Date:  09/24/2012   Anticipated DC Plan:  HOME/SELF CARE         Choice offered to / List presented to:             Status of service:  Completed, signed off Medicare Important Message given?   (If response is "NO", the following Medicare IM given date fields will be blank) Date Medicare IM given:   Date Additional Medicare IM given:    Discharge Disposition:  SKILLED NURSING FACILITY  Per UR Regulation:  Reviewed for med. necessity/level of care/duration of stay  If discussed at Long Length of Stay Meetings, dates discussed:    Comments:  [id number] Earlene Plater, RN, BSN, CCM:  CHART REVIEWED AND UPDATED.  Next chart review due on [id number]. NO DISCHARGE NEEDS PRESENT AT THIS TIME. CASE MANAGEMENT [id number]

## 2012-09-27 NOTE — Discharge Summary (Signed)
Physician Discharge Summary  Patient ID: Erin Hansen MRN: [id number] DOB/AGE: 09-Oct-1925 79 y.o.  Admit date: 09/20/2012 Discharge date: 09/27/2012  Admission Diagnoses: Colon cancer Chronic anemia  Discharge Diagnoses:  Stage III colon cancer Acute on chronic blood loss anemia Hypokalemia  Discharged Condition: good  Hospital Course: 76 yof who presented with anemia, incontinence of stool and sigmoid colon cancer.  She underwent laparoscopic sigmoid colectomy with colostomy.  Pathology shows a stage III colon cancer.  Postoperatively she has resolved her ileus. She has stable acute on chronic blood loss anemia.  She is tolerating a regular diet, voiding well, her strength is slowly improving.  Wounds have remained clean without infection.  She is ready for discharge.  Consults: None  Significant Diagnostic Studies: none  Treatments: surgery: laparoscopic sigmoid colectomy   Disposition:   Discharge Orders    Future Appointments: Provider: Department: Dept Phone: Center:   10/07/2012 11:10 AM Emelia Loron, MD [facility] [phone] None   10/14/2012 3:00 PM Ladene Artist, MD [facility] [phone] None       Medication List     As of 09/27/2012  8:17 AM    TAKE these medications         acetaminophen 500 MG tablet   Commonly known as: TYLENOL   Take 1,000 mg by mouth every 4 (four) hours as needed. For pain      cholecalciferol 1000 UNITS tablet   Commonly known as: VITAMIN D   Take 1,000 Units by mouth every morning.      docusate sodium 100 MG capsule   Commonly known as: COLACE   Take 400 mg by mouth at bedtime as needed. For constipation      erythromycin ophthalmic ointment   Place 1 application into both eyes at bedtime.      ferrous sulfate 325 (65 FE) MG tablet   Take 325 mg by mouth 2 (two) times a week. Takes on Wednesdays and Sundays.      fish oil-omega-3 fatty acids 1000 MG capsule   Take 1  g by mouth daily.      hydrochlorothiazide 25 MG tablet   Commonly known as: HYDRODIURIL   Take 25 mg by mouth every morning.      lactulose 10 GM/15ML solution   Commonly known as: CHRONULAC   Take 40 g by mouth at bedtime.      Melatonin 5 MG Tabs   Take 5 mg by mouth at bedtime as needed. For sleep      oxyCODONE-acetaminophen 5-325 MG per tablet   Commonly known as: PERCOCET/ROXICET   Take 1-2 tablets by mouth every 4 (four) hours as needed.      potassium chloride SA 20 MEQ tablet   Commonly known as: K-DUR,KLOR-CON   Take 1 tablet (20 mEq total) by mouth daily.      psyllium 58.6 % powder   Commonly known as: METAMUCIL   Take 1 packet by mouth at bedtime. Takes following lactulose.      sertraline 50 MG tablet   Commonly known as: ZOLOFT   Take 75 mg by mouth every morning.           Follow-up Information    Follow up with [facility], [place]. In 3 weeks.   Contact information:   [address] [place] [address] [phone]          Signed: Emelia Loron 09/27/2012, 8:17 AM

## 2012-09-27 NOTE — Progress Notes (Signed)
Pt d/c back to [facility] ( SNF )  via family transport this afternoon.   Cori Razor LCSW [phone]

## 2012-10-07 ENCOUNTER — Encounter (INDEPENDENT_AMBULATORY_CARE_PROVIDER_SITE_OTHER): Payer: Medicare Other | Admitting: General Surgery

## 2012-10-07 ENCOUNTER — Telehealth (INDEPENDENT_AMBULATORY_CARE_PROVIDER_SITE_OTHER): Payer: Self-pay | Admitting: General Surgery

## 2012-10-07 ENCOUNTER — Emergency Department (HOSPITAL_COMMUNITY)
Admission: EM | Admit: 2012-10-07 | Discharge: 2012-10-07 | Disposition: A | Discharge status: Home / Self Care | Payer: Medicare Other | Attending: Emergency Medicine | Admitting: Emergency Medicine

## 2012-10-07 ENCOUNTER — Encounter (HOSPITAL_COMMUNITY): Payer: Self-pay | Admitting: Emergency Medicine

## 2012-10-07 ENCOUNTER — Emergency Department (HOSPITAL_COMMUNITY): Payer: Medicare Other

## 2012-10-07 DIAGNOSIS — Z9889 Other specified postprocedural states: Secondary | ICD-10-CM | POA: Insufficient documentation

## 2012-10-07 DIAGNOSIS — Z85038 Personal history of other malignant neoplasm of large intestine: Secondary | ICD-10-CM | POA: Insufficient documentation

## 2012-10-07 DIAGNOSIS — R109 Unspecified abdominal pain: Secondary | ICD-10-CM | POA: Insufficient documentation

## 2012-10-07 DIAGNOSIS — Z8709 Personal history of other diseases of the respiratory system: Secondary | ICD-10-CM | POA: Insufficient documentation

## 2012-10-07 DIAGNOSIS — IMO0002 Reserved for concepts with insufficient information to code with codable children: Secondary | ICD-10-CM | POA: Insufficient documentation

## 2012-10-07 DIAGNOSIS — M6281 Muscle weakness (generalized): Secondary | ICD-10-CM | POA: Insufficient documentation

## 2012-10-07 DIAGNOSIS — Z8781 Personal history of (healed) traumatic fracture: Secondary | ICD-10-CM | POA: Insufficient documentation

## 2012-10-07 DIAGNOSIS — Y838 Other surgical procedures as the cause of abnormal reaction of the patient, or of later complication, without mention of misadventure at the time of the procedure: Secondary | ICD-10-CM | POA: Insufficient documentation

## 2012-10-07 DIAGNOSIS — Z8719 Personal history of other diseases of the digestive system: Secondary | ICD-10-CM | POA: Insufficient documentation

## 2012-10-07 DIAGNOSIS — Z90710 Acquired absence of both cervix and uterus: Secondary | ICD-10-CM | POA: Insufficient documentation

## 2012-10-07 DIAGNOSIS — Z8742 Personal history of other diseases of the female genital tract: Secondary | ICD-10-CM | POA: Insufficient documentation

## 2012-10-07 DIAGNOSIS — Z79899 Other long term (current) drug therapy: Secondary | ICD-10-CM | POA: Insufficient documentation

## 2012-10-07 DIAGNOSIS — R112 Nausea with vomiting, unspecified: Secondary | ICD-10-CM | POA: Insufficient documentation

## 2012-10-07 DIAGNOSIS — D649 Anemia, unspecified: Secondary | ICD-10-CM | POA: Insufficient documentation

## 2012-10-07 DIAGNOSIS — M1990 Unspecified osteoarthritis, unspecified site: Secondary | ICD-10-CM | POA: Insufficient documentation

## 2012-10-07 LAB — CBC WITH DIFFERENTIAL/PLATELET
HCT: 30.5 % — ABNORMAL LOW (ref 36.0–46.0)
Hemoglobin: 10 g/dL — ABNORMAL LOW (ref 12.0–15.0)
Lymphocytes Relative: 8 % — ABNORMAL LOW (ref 12–46)
Lymphs Abs: 1.1 10*3/uL (ref 0.7–4.0)
MCHC: 32.8 g/dL (ref 30.0–36.0)
Monocytes Absolute: 0.9 10*3/uL (ref 0.1–1.0)
Monocytes Relative: 8 % (ref 3–12)
Neutro Abs: 10.5 10*3/uL — ABNORMAL HIGH (ref 1.7–7.7)
RBC: 3.46 MIL/uL — ABNORMAL LOW (ref 3.87–5.11)
WBC: 12.5 10*3/uL — ABNORMAL HIGH (ref 4.0–10.5)

## 2012-10-07 LAB — COMPREHENSIVE METABOLIC PANEL
ALT: 22 U/L (ref 0–35)
Alkaline Phosphatase: 146 U/L — ABNORMAL HIGH (ref 39–117)
BUN: 21 mg/dL (ref 6–23)
CO2: 26 mEq/L (ref 19–32)
Chloride: 98 mEq/L (ref 96–112)
GFR calc Af Amer: 67 mL/min — ABNORMAL LOW (ref >90)
Glucose, Bld: 160 mg/dL — ABNORMAL HIGH (ref 70–99)
Potassium: 3.9 mEq/L (ref 3.5–5.1)
Sodium: 136 mEq/L (ref 135–145)
Total Bilirubin: 0.7 mg/dL (ref 0.3–1.2)

## 2012-10-07 LAB — LACTIC ACID, PLASMA: Lactic Acid, Venous: 1.5 mmol/L (ref 0.5–2.2)

## 2012-10-07 MED ORDER — OXYCODONE-ACETAMINOPHEN 5-325 MG PO TABS: 1.0000 | ORAL_TABLET | ORAL | Status: DC | PRN

## 2012-10-07 MED ORDER — ONDANSETRON HCL 4 MG PO TABS: 4.0000 mg | ORAL_TABLET | Freq: Four times a day (QID) | ORAL | Status: DC

## 2012-10-07 MED ORDER — SODIUM CHLORIDE 0.9 % IV SOLN
Freq: Once | INTRAVENOUS | Status: AC
  Administered 2012-10-07: 19:00:00 via INTRAVENOUS

## 2012-10-07 MED ORDER — ONDANSETRON HCL 4 MG/2ML IJ SOLN
4.0000 mg | Freq: Once | INTRAMUSCULAR | Status: AC
  Administered 2012-10-07: 4 mg via INTRAVENOUS
  Filled 2012-10-07: qty 2

## 2012-10-07 MED ORDER — SODIUM CHLORIDE 0.9 % IV BOLUS (SEPSIS)
1000.0000 mL | Freq: Once | INTRAVENOUS | Status: AC
  Administered 2012-10-07: 1000 mL via INTRAVENOUS

## 2012-10-07 MED ORDER — MORPHINE SULFATE 4 MG/ML IJ SOLN
4.0000 mg | Freq: Once | INTRAMUSCULAR | Status: AC
  Administered 2012-10-07: 4 mg via INTRAVENOUS
  Filled 2012-10-07: qty 1

## 2012-10-07 NOTE — ED Notes (Signed)
Patient transported to X-ray 

## 2012-10-07 NOTE — ED Notes (Signed)
Pt passed fluid challenge, denying any nausea or vomiting at this time.

## 2012-10-07 NOTE — Telephone Encounter (Signed)
Pt advocate, Erlinda Hong, called to alert Dr. Dwain Sarna and Helmut Muster that the pt's ostomy bag is not fitting her appropriately.  She is a pt at [facility].  Gave the advocate contact information for Tristan Schroeder at [facility] to help with this and will alert the MD as well.

## 2012-10-07 NOTE — ED Notes (Signed)
Patient unable to void at this time

## 2012-10-07 NOTE — ED Provider Notes (Signed)
History     CSN: [id number]  Arrival date & time 10/07/12  1516   First MD Initiated Contact with Patient 10/07/12 [phone]      Chief Complaint  Patient presents with  . GI Problem    colostomy problem  . Nausea    (Consider location/radiation/quality/duration/timing/severity/associated sxs/prior treatment) Patient is a 79 y.o. female presenting with GI illness. The history is provided by the patient.  GI Problem   She has been having crampy abdominal pain, nausea, vomiting for the last 3 days. She had surgery with a colostomy earlier this month and a colostomy has not been draining well for the last several days. There've been no fever, chills, sweats. Abdominal pain gets worse when she vomits. Pain is currently 2/10, but isn't as severe as 10/10. She's also complaining of being extremely weak.  Past Medical History  Diagnosis Date  . DJD (degenerative joint disease)   . Compression fracture     t12  . IBS (irritable bowel syndrome)   . Vaginal prolapse   . TMJ (temporomandibular joint disorder)   . Anemia   . Hearing loss   . Cough   . Constipation   . Weakness   . Rectal bleeding   . Blood in stool   . Anxiety   . Cancer     Past Surgical History  Procedure Date  . Appendectomy   . Vaginal prolapse repair 2005 - approximate  . Cystectomy     back  . Pelvic fracture surgery     reconstruction  . Nasal reconstruction with septal repair   . Skin cancer excision   . Arthroscopy left knee 2007  . Colonoscopy   . Total abdominal hysterectomy 1978  . Hernia repair 1980 - approximate  . Eye surgery [phone]    cataracts bilateral  . Back surgery     mid back  . Laparoscopic sigmoid colectomy 09/20/2012    Procedure: LAPAROSCOPIC SIGMOID COLECTOMY;  Surgeon: Emelia Loron, MD;  Location: [facility];  Service: General;  Laterality: N/A;  Laparoscopic possible Open Sigmoid Colectomy,Colostomy  . Colostomy 09/20/2012    Procedure: COLOSTOMY;  Surgeon: Emelia Loron, MD;  Location: [facility];  Service: General;  Laterality: N/A;    No family history on file.  History  Substance Use Topics  . Smoking status: Never Smoker   . Smokeless tobacco: Never Used  . Alcohol Use: No    OB History    Grav Para Term Preterm Abortions TAB SAB Ect Mult Living                  Review of Systems  All other systems reviewed and are negative.    Allergies  Codeine  Home Medications   Current Outpatient Rx  Name  Route  Sig  Dispense  Refill  . ACETAMINOPHEN 500 MG PO TABS   Oral   Take 1,000 mg by mouth every 4 (four) hours as needed. For pain         . VITAMIN D 1000 UNITS PO TABS   Oral   Take 1,000 Units by mouth every morning.         Marland Kitchen DOCUSATE SODIUM 100 MG PO CAPS   Oral   Take 400 mg by mouth at bedtime as needed. For constipation         . ERYTHROMYCIN 5 MG/GM OP OINT   Both Eyes   Place 1 application into both eyes at bedtime.         Marland Kitchen  FERROUS SULFATE 325 (65 FE) MG PO TABS   Oral   Take 325 mg by mouth 2 (two) times a week. Takes on Wednesdays and Sundays.         . OMEGA-3 FATTY ACIDS 1000 MG PO CAPS   Oral   Take 1 g by mouth daily.         Marland Kitchen HYDROCHLOROTHIAZIDE 25 MG PO TABS   Oral   Take 25 mg by mouth every morning.          Marland Kitchen LACTULOSE 10 GM/15ML PO SOLN   Oral   Take 40 g by mouth at bedtime.         Marland Kitchen MELATONIN 5 MG PO TABS   Oral   Take 5 mg by mouth at bedtime as needed. For sleep         . OXYCODONE-ACETAMINOPHEN 5-325 MG PO TABS   Oral   Take 1-2 tablets by mouth every 4 (four) hours as needed.   30 tablet   0   . OXYCODONE-ACETAMINOPHEN 5-325 MG PO TABS   Oral   Take 1 tablet by mouth every 4 (four) hours as needed for pain.   30 tablet   0   . POTASSIUM CHLORIDE CRYS ER 20 MEQ PO TBCR   Oral   Take 1 tablet (20 mEq total) by mouth daily.   30 tablet   0   . PSYLLIUM 58.6 % PO POWD   Oral   Take 1 packet by mouth at bedtime. Takes following lactulose.          Marland Kitchen SERTRALINE HCL 50 MG PO TABS   Oral   Take 75 mg by mouth every morning.           BP 103/54  Pulse 110  Temp 97.6 F (36.4 C) (Oral)  Resp 22  SpO2 97%  Physical Exam  Nursing note and vitals reviewed.  79 year old female, resting comfortably and in no acute distress. Vital signs are significant for tachypnea with respiratory rate of 22, and tachycardia with heart rate 110. Oxygen saturation is 97%, which is normal. Head is normocephalic and atraumatic. PERRLA, EOMI. Oropharynx is clear. Neck is nontender and supple without adenopathy or JVD. Back is nontender and there is no CVA tenderness. Lungs are clear without rales, wheezes, or rhonchi. Chest is nontender. Heart is tachycardic without murmur. Abdomen is soft, flat, with mild tenderness diffusely. There is no rebound or guarding. Colostomy is present in left lower quadrant with very little drainage. There are no masses or hepatosplenomegaly and peristalsis is hypoactive. Extremities have no cyanosis or edema, full range of motion is present. Skin is warm and dry without rash. Neurologic: Mental status is normal, cranial nerves are intact, there are no motor or sensory deficits.  ED Course  Procedures (including critical care time)  Results for orders placed during the hospital encounter of 10/07/12  CBC WITH DIFFERENTIAL      Component Value Range   WBC 12.5 (*) 4.0 - 10.5 K/uL   RBC 3.46 (*) 3.87 - 5.11 MIL/uL   Hemoglobin 10.0 (*) 12.0 - 15.0 g/dL   HCT 16.1 (*) 09.6 - 04.5 %   MCV 88.2  78.0 - 100.0 fL   MCH 28.9  26.0 - 34.0 pg   MCHC 32.8  30.0 - 36.0 g/dL   RDW 40.9  81.1 - 91.4 %   Platelets 310  150 - 400 K/uL   Neutrophils Relative 84 (*) 43 - 77 %  Neutro Abs 10.5 (*) 1.7 - 7.7 K/uL   Lymphocytes Relative 8 (*) 12 - 46 %   Lymphs Abs 1.1  0.7 - 4.0 K/uL   Monocytes Relative 8  3 - 12 %   Monocytes Absolute 0.9  0.1 - 1.0 K/uL   Eosinophils Relative 0  0 - 5 %   Eosinophils Absolute 0.0  0.0 -  0.7 K/uL   Basophils Relative 0  0 - 1 %   Basophils Absolute 0.0  0.0 - 0.1 K/uL  LIPASE, BLOOD      Component Value Range   Lipase 17  11 - 59 U/L  COMPREHENSIVE METABOLIC PANEL      Component Value Range   Sodium 136  135 - 145 mEq/L   Potassium 3.9  3.5 - 5.1 mEq/L   Chloride 98  96 - 112 mEq/L   CO2 26  19 - 32 mEq/L   Glucose, Bld 160 (*) 70 - 99 mg/dL   BUN 21  6 - 23 mg/dL   Creatinine, Ser 4.09  0.50 - 1.10 mg/dL   Calcium 9.2  8.4 - 81.1 mg/dL   Total Protein 6.7  6.0 - 8.3 g/dL   Albumin 2.9 (*) 3.5 - 5.2 g/dL   AST 31  0 - 37 U/L   ALT 22  0 - 35 U/L   Alkaline Phosphatase 146 (*) 39 - 117 U/L   Total Bilirubin 0.7  0.3 - 1.2 mg/dL   GFR calc non Af Amer 57 (*) >90 mL/min   GFR calc Af Amer 67 (*) >90 mL/min  LACTIC ACID, PLASMA      Component Value Range   Lactic Acid, Venous 1.5  0.5 - 2.2 mmol/L   Dg Chest 2 View  09/15/2012  *RADIOLOGY REPORT*  Clinical Data: History of colon carcinoma; preoperative colectomy  CHEST - 2 VIEW  Comparison: Chest radiograph September 10, 2011; chest CT August 26, 2012  Findings:  There is mild scarring in the left lung base.  The lungs are otherwise clear.  Heart size and pulmonary vascularity are within normal limits.  There is a hiatal hernia. No adenopathy.  There is marked collapse of the T12 vertebral body, a stable finding.  There is localized kyphosis in the area of T12.  IMPRESSION:   Lungs are clear except for stable left base scarring. No adenopathy.  Stable marked collapse of the T12 vertebral body.   Original Report Authenticated By: Bretta Bang, M.D.    US Biopsy  09/14/2012  *RADIOLOGY REPORT*  Clinical Data: Recent diagnosis of carcinoma of the rectosigmoid colon.  Ill-defined liver lesion was visualized by CT in the right lobe of the liver and the patient presents for liver biopsy.  ULTRASOUND GUIDED CORE BIOPSY OF LIVER  Sedation:  1.0 mg IV Versed;  50 mcg IV Fentanyl  Total Moderate Sedation Time: 18 minutes.   Procedure:  The procedure, risks, benefits, and alternatives were explained to the patient.  Questions regarding the procedure were encouraged and answered.  The patient understands and consents to the procedure.  The abdominal wall was prepped with Betadine in a sterile fashion, and a sterile drape was applied covering the operative field.  A sterile gown and sterile gloves were used for the procedure. Local anesthesia was provided with 1% Lidocaine.  Preliminary ultrasound was performed of the liver.  After localizing a lesion in the right lobe, a 17 gauge needle was advanced under direct ultrasound guidance to the level of the  lesion.  Coaxial 18-gauge core biopsy samples were obtained.  Three core biopsy samples were submitted in formalin.  The outer needle was removed and additional ultrasound performed.  Complications: None  Findings: A relatively hyperechoic lesion is visualized in the right lobe of the liver in a location corresponding to the abnormality detected by CT.  This lesion measures approximately 1.6 cm in greatest diameter by ultrasound.  Solid tissue was obtained from the region of the lesion with core biopsy.  IMPRESSION: Ultrasound guided core biopsy performed of a lesion in the right lobe of the liver.  This lesion corresponds in location to the lesion depicted by CT and measures approximately 1.6 cm by ultrasound.   Original Report Authenticated By: Irish Lack, M.D.    Dg Abd Acute W/chest  10/07/2012  *RADIOLOGY REPORT*  Clinical Data: Nausea.  Abdominal pain.  History of recent ascending colectomy.  ACUTE ABDOMEN SERIES (ABDOMEN 2 VIEW & CHEST 1 VIEW)  Comparison: 09/15/2012.  Findings: Heart size upper limits of normal for projection. Emphysematous changes in the lungs with basilar atelectasis.  There is no free air underneath the hemidiaphragms.  The bowel gas pattern is nonobstructive.  Colostomy appliance is present over the left central abdomen.  Lumbar spondylosis.  There is no  small or large bowel dilation.  Surgical clips project over the left sacral ala.  Monitoring leads also project over the chest and abdomen. T12 vertebral augmentation.  IMPRESSION: Nonobstructive bowel gas pattern.  Enterostomy in the left mid abdomen.  No acute abnormality.   Original Report Authenticated By: Andreas Newport, M.D.    Images viewed by me.   1. Nausea and vomiting   2. Abdominal pain       MDM  Abdominal pain, vomiting, decreased output through colostomy of. This is either from obstruction or ileus. Old records are reviewed and she had surgery for colon cancer with sigmoid resection and colostomy approximately 2 weeks ago. At this time and her postoperative course, ileus would be more likely than SBO.  She got good relief of pain and nausea with hydromorphone and ondansetron. Following this, she was able to tolerate oral fluids. X-ray does not show evidence of earache she probably does have a mild ileus but not when so severe that she would require inpatient care. She is discharged back to her nursing facility with prescriptions for ondansetron and Percocet. She has a followup appointment scheduled with her surgeon and she is to keep that appointment.        Dione Booze, MD 10/07/12 1950

## 2012-10-07 NOTE — ED Notes (Signed)
[id number]<AV> Expected date:<BR> Expected time:<BR> Means of arrival:<BR> Comments:<BR> Colostomy problems

## 2012-10-07 NOTE — ED Notes (Addendum)
Per EMS: From [facility] .Colostomy bag about a week ago now very nauseous and pain around bag. Pt states that she is very nauseous but has not thrown up today but has the past couple of day. States she almost passed out today while walking to the nurse's station.

## 2012-10-08 ENCOUNTER — Encounter (HOSPITAL_COMMUNITY): Payer: Self-pay | Admitting: *Deleted

## 2012-10-08 ENCOUNTER — Inpatient Hospital Stay (HOSPITAL_COMMUNITY): Payer: Medicare Other

## 2012-10-08 ENCOUNTER — Inpatient Hospital Stay (HOSPITAL_COMMUNITY)
Admission: EM | Admit: 2012-10-08 | Discharge: 2012-10-14 | DRG: 175 | Disposition: A | Discharge status: Skilled Nursing Facility | Payer: Medicare Other | Attending: Internal Medicine | Admitting: Internal Medicine

## 2012-10-08 ENCOUNTER — Emergency Department (HOSPITAL_COMMUNITY): Payer: Medicare Other

## 2012-10-08 DIAGNOSIS — E876 Hypokalemia: Secondary | ICD-10-CM | POA: Diagnosis present

## 2012-10-08 DIAGNOSIS — H9190 Unspecified hearing loss, unspecified ear: Secondary | ICD-10-CM | POA: Diagnosis present

## 2012-10-08 DIAGNOSIS — E860 Dehydration: Secondary | ICD-10-CM

## 2012-10-08 DIAGNOSIS — J189 Pneumonia, unspecified organism: Secondary | ICD-10-CM

## 2012-10-08 DIAGNOSIS — F411 Generalized anxiety disorder: Secondary | ICD-10-CM | POA: Diagnosis present

## 2012-10-08 DIAGNOSIS — R0781 Pleurodynia: Secondary | ICD-10-CM

## 2012-10-08 DIAGNOSIS — C189 Malignant neoplasm of colon, unspecified: Secondary | ICD-10-CM

## 2012-10-08 DIAGNOSIS — R9431 Abnormal electrocardiogram [ECG] [EKG]: Secondary | ICD-10-CM

## 2012-10-08 DIAGNOSIS — I214 Non-ST elevation (NSTEMI) myocardial infarction: Secondary | ICD-10-CM

## 2012-10-08 DIAGNOSIS — R799 Abnormal finding of blood chemistry, unspecified: Secondary | ICD-10-CM | POA: Diagnosis present

## 2012-10-08 DIAGNOSIS — Z933 Colostomy status: Secondary | ICD-10-CM

## 2012-10-08 DIAGNOSIS — I2789 Other specified pulmonary heart diseases: Secondary | ICD-10-CM | POA: Diagnosis present

## 2012-10-08 DIAGNOSIS — E871 Hypo-osmolality and hyponatremia: Secondary | ICD-10-CM | POA: Diagnosis present

## 2012-10-08 DIAGNOSIS — Z79899 Other long term (current) drug therapy: Secondary | ICD-10-CM

## 2012-10-08 DIAGNOSIS — R7989 Other specified abnormal findings of blood chemistry: Secondary | ICD-10-CM

## 2012-10-08 DIAGNOSIS — I2699 Other pulmonary embolism without acute cor pulmonale: Principal | ICD-10-CM

## 2012-10-08 DIAGNOSIS — I82409 Acute embolism and thrombosis of unspecified deep veins of unspecified lower extremity: Secondary | ICD-10-CM | POA: Diagnosis present

## 2012-10-08 DIAGNOSIS — S36112A Contusion of liver, initial encounter: Secondary | ICD-10-CM

## 2012-10-08 DIAGNOSIS — M26609 Unspecified temporomandibular joint disorder, unspecified side: Secondary | ICD-10-CM | POA: Diagnosis present

## 2012-10-08 DIAGNOSIS — Z9049 Acquired absence of other specified parts of digestive tract: Secondary | ICD-10-CM

## 2012-10-08 DIAGNOSIS — K7689 Other specified diseases of liver: Secondary | ICD-10-CM | POA: Diagnosis present

## 2012-10-08 DIAGNOSIS — I219 Acute myocardial infarction, unspecified: Secondary | ICD-10-CM

## 2012-10-08 DIAGNOSIS — R197 Diarrhea, unspecified: Secondary | ICD-10-CM

## 2012-10-08 DIAGNOSIS — R1110 Vomiting, unspecified: Secondary | ICD-10-CM

## 2012-10-08 DIAGNOSIS — D638 Anemia in other chronic diseases classified elsewhere: Secondary | ICD-10-CM | POA: Diagnosis present

## 2012-10-08 DIAGNOSIS — N390 Urinary tract infection, site not specified: Secondary | ICD-10-CM | POA: Diagnosis present

## 2012-10-08 LAB — CBC WITH DIFFERENTIAL/PLATELET
Eosinophils Absolute: 0 10*3/uL (ref 0.0–0.7)
Eosinophils Relative: 0 % (ref 0–5)
HCT: 27.1 % — ABNORMAL LOW (ref 36.0–46.0)
Hemoglobin: 8.7 g/dL — ABNORMAL LOW (ref 12.0–15.0)
Lymphs Abs: 1.4 10*3/uL (ref 0.7–4.0)
MCH: 28.6 pg (ref 26.0–34.0)
MCV: 89.1 fL (ref 78.0–100.0)
Monocytes Absolute: 1.4 10*3/uL — ABNORMAL HIGH (ref 0.1–1.0)
Monocytes Relative: 12 % (ref 3–12)
Neutrophils Relative %: 74 % (ref 43–77)
RBC: 3.04 MIL/uL — ABNORMAL LOW (ref 3.87–5.11)

## 2012-10-08 LAB — URINE MICROSCOPIC-ADD ON

## 2012-10-08 LAB — COMPREHENSIVE METABOLIC PANEL
Alkaline Phosphatase: 121 U/L — ABNORMAL HIGH (ref 39–117)
BUN: 28 mg/dL — ABNORMAL HIGH (ref 6–23)
Creatinine, Ser: 1.23 mg/dL — ABNORMAL HIGH (ref 0.50–1.10)
GFR calc Af Amer: 45 mL/min — ABNORMAL LOW (ref >90)
Glucose, Bld: 140 mg/dL — ABNORMAL HIGH (ref 70–99)
Potassium: 4 mEq/L (ref 3.5–5.1)
Total Protein: 5.9 g/dL — ABNORMAL LOW (ref 6.0–8.3)

## 2012-10-08 LAB — URINALYSIS, ROUTINE W REFLEX MICROSCOPIC
Glucose, UA: NEGATIVE mg/dL
Hgb urine dipstick: NEGATIVE
Protein, ur: 30 mg/dL — AB

## 2012-10-08 LAB — AMYLASE: Amylase: 55 U/L (ref 0–105)

## 2012-10-08 LAB — LIPASE, BLOOD: Lipase: 16 U/L (ref 11–59)

## 2012-10-08 MED ORDER — FERROUS SULFATE 325 (65 FE) MG PO TABS
325.0000 mg | ORAL_TABLET | Freq: Every day | ORAL | Status: DC
  Administered 2012-10-09 – 2012-10-14 (×6): 325 mg via ORAL
  Filled 2012-10-08 (×8): qty 1

## 2012-10-08 MED ORDER — LACTULOSE 10 GM/15ML PO SOLN
40.0000 g | Freq: Every day | ORAL | Status: DC
  Administered 2012-10-09 – 2012-10-13 (×5): 40 g via ORAL
  Filled 2012-10-08 (×8): qty 60

## 2012-10-08 MED ORDER — HYDROCODONE-ACETAMINOPHEN 5-325 MG PO TABS
1.0000 | ORAL_TABLET | ORAL | Status: DC | PRN
  Administered 2012-10-09: 2 via ORAL
  Filled 2012-10-08: qty 2

## 2012-10-08 MED ORDER — SODIUM CHLORIDE 0.9 % IJ SOLN
3.0000 mL | Freq: Two times a day (BID) | INTRAMUSCULAR | Status: DC
  Administered 2012-10-10 – 2012-10-13 (×3): 3 mL via INTRAVENOUS

## 2012-10-08 MED ORDER — ACETAMINOPHEN 325 MG PO TABS: 650.0000 mg | ORAL_TABLET | Freq: Four times a day (QID) | ORAL | Status: DC | PRN

## 2012-10-08 MED ORDER — ENOXAPARIN SODIUM 80 MG/0.8ML ~~LOC~~ SOLN
70.0000 mg | SUBCUTANEOUS | Status: DC
  Administered 2012-10-09: 70 mg via SUBCUTANEOUS
  Filled 2012-10-08 (×2): qty 0.8

## 2012-10-08 MED ORDER — FENTANYL CITRATE 0.05 MG/ML IJ SOLN
50.0000 ug | Freq: Once | INTRAMUSCULAR | Status: AC
  Administered 2012-10-08: 50 ug via INTRAVENOUS
  Filled 2012-10-08: qty 2

## 2012-10-08 MED ORDER — IOHEXOL 300 MG/ML  SOLN
50.0000 mL | Freq: Once | INTRAMUSCULAR | Status: AC | PRN
  Administered 2012-10-08: 50 mL via ORAL

## 2012-10-08 MED ORDER — ONDANSETRON HCL 4 MG/2ML IJ SOLN
4.0000 mg | Freq: Once | INTRAMUSCULAR | Status: AC
  Administered 2012-10-08: 4 mg via INTRAVENOUS
  Filled 2012-10-08: qty 2

## 2012-10-08 MED ORDER — MORPHINE SULFATE 2 MG/ML IJ SOLN
2.0000 mg | INTRAMUSCULAR | Status: DC | PRN
  Administered 2012-10-09: 2 mg via INTRAVENOUS
  Filled 2012-10-08 (×2): qty 1

## 2012-10-08 MED ORDER — IOHEXOL 300 MG/ML  SOLN
100.0000 mL | Freq: Once | INTRAMUSCULAR | Status: AC | PRN
  Administered 2012-10-08: 100 mL via INTRAVENOUS

## 2012-10-08 MED ORDER — SERTRALINE HCL 50 MG PO TABS
75.0000 mg | ORAL_TABLET | Freq: Every morning | ORAL | Status: DC
  Administered 2012-10-09 – 2012-10-14 (×6): 75 mg via ORAL
  Filled 2012-10-08 (×6): qty 1

## 2012-10-08 MED ORDER — ACETAMINOPHEN 650 MG RE SUPP: 650.0000 mg | Freq: Four times a day (QID) | RECTAL | Status: DC | PRN

## 2012-10-08 MED ORDER — DEXTROSE 5 % IV SOLN: 1.0000 g | Freq: Three times a day (TID) | INTRAVENOUS | Status: DC

## 2012-10-08 MED ORDER — SODIUM CHLORIDE 0.9 % IJ SOLN: 3.0000 mL | INTRAMUSCULAR | Status: DC | PRN

## 2012-10-08 MED ORDER — ONDANSETRON HCL 4 MG PO TABS: 4.0000 mg | ORAL_TABLET | Freq: Four times a day (QID) | ORAL | Status: DC | PRN

## 2012-10-08 MED ORDER — SODIUM CHLORIDE 0.9 % IV BOLUS (SEPSIS)
1000.0000 mL | Freq: Once | INTRAVENOUS | Status: AC
  Administered 2012-10-08: 1000 mL via INTRAVENOUS

## 2012-10-08 MED ORDER — SODIUM CHLORIDE 0.9 % IJ SOLN
3.0000 mL | Freq: Two times a day (BID) | INTRAMUSCULAR | Status: DC
  Administered 2012-10-09 (×2): 3 mL via INTRAVENOUS
  Administered 2012-10-11: 10:00:00 via INTRAVENOUS
  Administered 2012-10-11 – 2012-10-13 (×4): 3 mL via INTRAVENOUS

## 2012-10-08 MED ORDER — ALBUTEROL SULFATE (5 MG/ML) 0.5% IN NEBU
2.5000 mg | INHALATION_SOLUTION | Freq: Four times a day (QID) | RESPIRATORY_TRACT | Status: DC
  Administered 2012-10-09 (×3): 2.5 mg via RESPIRATORY_TRACT
  Filled 2012-10-08 (×4): qty 0.5

## 2012-10-08 MED ORDER — DEXTROSE 5 % IV SOLN
1.0000 g | Freq: Two times a day (BID) | INTRAVENOUS | Status: DC
  Administered 2012-10-08 – 2012-10-10 (×5): 1 g via INTRAVENOUS
  Filled 2012-10-08 (×6): qty 1

## 2012-10-08 MED ORDER — LEVOFLOXACIN IN D5W 750 MG/150ML IV SOLN
750.0000 mg | INTRAVENOUS | Status: DC
  Administered 2012-10-08: 750 mg via INTRAVENOUS
  Filled 2012-10-08: qty 150

## 2012-10-08 MED ORDER — ERYTHROMYCIN 5 MG/GM OP OINT
1.0000 "application " | TOPICAL_OINTMENT | Freq: Every day | OPHTHALMIC | Status: DC
  Filled 2012-10-08: qty 3.5

## 2012-10-08 MED ORDER — ONDANSETRON HCL 4 MG/2ML IJ SOLN: 4.0000 mg | Freq: Four times a day (QID) | INTRAMUSCULAR | Status: DC | PRN

## 2012-10-08 MED ORDER — POTASSIUM CHLORIDE CRYS ER 20 MEQ PO TBCR
20.0000 meq | EXTENDED_RELEASE_TABLET | Freq: Two times a day (BID) | ORAL | Status: DC
  Administered 2012-10-09 – 2012-10-11 (×6): 20 meq via ORAL
  Filled 2012-10-08 (×9): qty 1

## 2012-10-08 MED ORDER — SODIUM CHLORIDE 0.9 % IV SOLN: 250.0000 mL | INTRAVENOUS | Status: DC | PRN

## 2012-10-08 MED ORDER — ASPIRIN EC 81 MG PO TBEC
81.0000 mg | DELAYED_RELEASE_TABLET | Freq: Every day | ORAL | Status: DC
  Administered 2012-10-09 – 2012-10-10 (×2): 81 mg via ORAL
  Filled 2012-10-08 (×2): qty 1

## 2012-10-08 MED ORDER — LEVOFLOXACIN IN D5W 750 MG/150ML IV SOLN
750.0000 mg | INTRAVENOUS | Status: AC
  Administered 2012-10-10: 750 mg via INTRAVENOUS
  Filled 2012-10-08: qty 150

## 2012-10-08 MED ORDER — ZOLPIDEM TARTRATE 5 MG PO TABS
5.0000 mg | ORAL_TABLET | Freq: Every evening | ORAL | Status: DC | PRN
  Administered 2012-10-10 (×2): 5 mg via ORAL
  Filled 2012-10-08 (×2): qty 1

## 2012-10-08 MED ORDER — PANTOPRAZOLE SODIUM 40 MG IV SOLR
40.0000 mg | Freq: Every day | INTRAVENOUS | Status: DC
  Administered 2012-10-09 – 2012-10-13 (×6): 40 mg via INTRAVENOUS
  Filled 2012-10-08 (×7): qty 40

## 2012-10-08 MED ORDER — SODIUM CHLORIDE 0.9 % IV SOLN
INTRAVENOUS | Status: DC
  Administered 2012-10-08 – 2012-10-09 (×3): via INTRAVENOUS

## 2012-10-08 MED ORDER — LEVOFLOXACIN IN D5W 750 MG/150ML IV SOLN: 750.0000 mg | INTRAVENOUS | Status: DC

## 2012-10-08 MED ORDER — DEXTROSE 5 % IV SOLN
1.0000 g | Freq: Three times a day (TID) | INTRAVENOUS | Status: DC
  Filled 2012-10-08: qty 1

## 2012-10-08 MED ORDER — VANCOMYCIN HCL IN DEXTROSE 1-5 GM/200ML-% IV SOLN
1000.0000 mg | INTRAVENOUS | Status: DC
  Administered 2012-10-09 – 2012-10-10 (×2): 1000 mg via INTRAVENOUS
  Filled 2012-10-08 (×4): qty 200

## 2012-10-08 MED ORDER — VANCOMYCIN HCL IN DEXTROSE 1-5 GM/200ML-% IV SOLN
1000.0000 mg | Freq: Once | INTRAVENOUS | Status: AC
  Administered 2012-10-08: 1000 mg via INTRAVENOUS
  Filled 2012-10-08: qty 200

## 2012-10-08 NOTE — Progress Notes (Signed)
ANTIBIOTIC CONSULT NOTE - INITIAL  Pharmacy Consult for vancomycin/Pharmacy to adjust antibiotics for renal fx (cefepime/levofloxacin) Indication: rule out pneumonia  Allergies  Allergen Reactions  . Codeine Nausea Only    Patient Measurements:   Adjusted Body Weight:   Vital Signs: Temp: 98.6 F (37 C) (02/01 1510) Temp src: Oral (02/01 1510) BP: 120/59 mmHg (02/01 1510) Intake/Output from previous day:   Intake/Output from this shift:    Labs:  Basename 10/08/12 1600 10/07/12 1655  WBC 11.0* 12.5*  HGB 8.7* 10.0*  PLT 238 310  LABCREA -- --  CREATININE 1.23* 0.89   The CrCl is unknown because both a height and weight (above a minimum accepted value) are required for this calculation. No results found for this basename: VANCOTROUGH:2,VANCOPEAK:2,VANCORANDOM:2,GENTTROUGH:2,GENTPEAK:2,GENTRANDOM:2,TOBRATROUGH:2,TOBRAPEAK:2,TOBRARND:2,AMIKACINPEAK:2,AMIKACINTROU:2,AMIKACIN:2, in the last 72 hours   Microbiology: Recent Results (from the past 720 hour(s))  SURGICAL PCR SCREEN     Status: Normal   Collection Time   09/15/12 11:30 AM      Component Value Range Status Comment   MRSA, PCR NEGATIVE  NEGATIVE Final    Staphylococcus aureus NEGATIVE  NEGATIVE Final     Medical History: Past Medical History  Diagnosis Date  . DJD (degenerative joint disease)   . Compression fracture     t12  . IBS (irritable bowel syndrome)   . Vaginal prolapse   . TMJ (temporomandibular joint disorder)   . Anemia   . Hearing loss   . Cough   . Constipation   . Weakness   . Rectal bleeding   . Blood in stool   . Anxiety   . Cancer     Assessment: 25 YOF presents with N/V/D w/ recent dx of colon cancer with laparoscopic sigmoid colectomy.  Has not received any chemotherapy to date.  SCr is slightly increased, suspect dehydration from vomiting/diarrhea. CXR reveals possible LLL infiltrate, no cough or other respiratory symptoms noted  2/1 >> vancomycin  >> 2/1 >> cefepime  >>    2/1 >> levofloxacin  >>    Tmax:Afeb WBCs: 11 Renal:SCr=1.23, N CrCl=68ml/min  / blood: / urine:  / sputum:  2/1: c. Diff:   Dose changes/drug level info:   Goal of Therapy:  Vancomycin trough level 15-20 mcg/ml  Plan:   Vancomycin 1gm IV x 1 now then 1gm IV q24h  Follow SCr and adjust dose as needed, check trough as indicated  Levofloxacin 750mg  IV q48h  Cefepime 1gm IV q12h  Dannielle Huh 10/08/2012,6:36 PM

## 2012-10-08 NOTE — H&P (Addendum)
[facility]                                                                                    Patient Demographics  Erin Hansen, is a 79 y.o. female  CSN: [id number]  MRN: [id number]  DOB - Oct 15, 1925  Admit Date - 10/08/2012  Outpatient Primary MD for the patient is Ginette Otto, MD   With History of -  Past Medical History  Diagnosis Date  . DJD (degenerative joint disease)   . Compression fracture     t12  . IBS (irritable bowel syndrome)   . Vaginal prolapse   . TMJ (temporomandibular joint disorder)   . Anemia   . Hearing loss   . Cough   . Constipation   . Weakness   . Rectal bleeding   . Blood in stool   . Anxiety   . Cancer       Past Surgical History  Procedure Date  . Appendectomy   . Vaginal prolapse repair 2005 - approximate  . Cystectomy     back  . Pelvic fracture surgery     reconstruction  . Nasal reconstruction with septal repair   . Skin cancer excision   . Arthroscopy left knee 2007  . Colonoscopy   . Total abdominal hysterectomy 1978  . Hernia repair 1980 - approximate  . Eye surgery [phone]    cataracts bilateral  . Back surgery     mid back  . Laparoscopic sigmoid colectomy 09/20/2012    Procedure: LAPAROSCOPIC SIGMOID COLECTOMY;  Surgeon: Emelia Loron, MD;  Location: [facility];  Service: General;  Laterality: N/A;  Laparoscopic possible Open Sigmoid Colectomy,Colostomy  . Colostomy 09/20/2012    Procedure: COLOSTOMY;  Surgeon: Emelia Loron, MD;  Location: [facility];  Service: General;  Laterality: N/A;    in for   Chief Complaint  Patient presents with  . N/V/D      HPI  Erin Hansen  is a 79 y.o. female, with no history of cardiac disease, presenting today with 2 day history of shortness of breath, nausea and vomiting. Patient denies chest pains, cold sweats. Her history started yesterday in the morning when she started becoming weak and had presyncopal episodes, her heart rate was noted to be  elevated. Patient was brought to the emergency room and she complained of shortness of breath, her chest x-ray was negative and she was sent back to her SNF. Today the shortness of breath increased, and a chest x-ray done at the nursing home showed a probable pneumonia which was confirmed here. The patient has a history of colon cancer stage III status post colostomy but no chemotherapy. Her colostomy was done on the 14th of last month and she was sent to the SNF to recuperate. No reports of abdominal pain but they noted that the output from her colostomy was increased and probably diarrhea.   Review of Systems    In addition to the HPI above,  No Fever-chills, No Headache, No changes with Vision or hearing, No problems swallowing food or Liquids, No Chest pain, No Abdominal pain,  Bowel movements are regular, No Blood in stool or Urine, No  dysuria, No new skin rashes or bruises, No new joints pains-aches,  No new weakness, tingling, numbness in any extremity, No recent weight gain or loss, No polyuria, polydypsia or polyphagia, No significant Mental Stressors.  A full 10 point Review of Systems was done, except as stated above, all other Review of Systems were negative.   Social History History  Substance Use Topics  . Smoking status: Never Smoker   . Smokeless tobacco: Never Used  . Alcohol Use: No     Family History Hypertension runs in the family  Prior to Admission medications   Medication Sig Start Date End Date Taking? Authorizing Provider  acetaminophen (TYLENOL) 500 MG tablet Take 1,000 mg by mouth every 4 (four) hours as needed. For pain   Yes Historical Provider, MD  docusate sodium (COLACE) 100 MG capsule Take 400 mg by mouth at bedtime as needed. For constipation   Yes Historical Provider, MD  erythromycin ophthalmic ointment Place 1 application into both eyes at bedtime.   Yes Historical Provider, MD  ferrous sulfate 325 (65 FE) MG tablet Take 325 mg by mouth  daily with breakfast.    Yes Historical Provider, MD  lactulose (CHRONULAC) 10 GM/15ML solution Take 40 g by mouth at bedtime.   Yes Historical Provider, MD  Melatonin 5 MG TABS Take 5 mg by mouth at bedtime as needed. For sleep   Yes Historical Provider, MD  ondansetron (ZOFRAN) 4 MG tablet Take 1 tablet (4 mg total) by mouth every 6 (six) hours. 10/07/12  Yes Dione Booze, MD  oxyCODONE-acetaminophen (PERCOCET/ROXICET) 5-325 MG per tablet Take 1-2 tablets by mouth every 4 (four) hours as needed. Moderate to severe pain. 09/27/12  Yes Emelia Loron, MD  potassium chloride SA (K-DUR,KLOR-CON) 20 MEQ tablet Take 20 mEq by mouth 2 (two) times daily. 09/16/12  Yes Ladene Artist, MD  psyllium (METAMUCIL) 58.6 % powder Take 1 packet by mouth at bedtime. Takes following lactulose.   Yes Historical Provider, MD  sertraline (ZOLOFT) 50 MG tablet Take 75 mg by mouth every morning.   Yes Historical Provider, MD  cholecalciferol (VITAMIN D) 1000 UNITS tablet Take 1,000 Units by mouth every morning.    Historical Provider, MD  fish oil-omega-3 fatty acids 1000 MG capsule Take 1 g by mouth daily.    Historical Provider, MD  hydrochlorothiazide (HYDRODIURIL) 25 MG tablet Take 25 mg by mouth every morning.     Historical Provider, MD    Allergies  Allergen Reactions  . Codeine Nausea Only    Physical Exam  Vitals  Blood pressure 108/54, pulse 95, temperature 98.6 F (37 C), temperature source Oral, resp. rate 20, SpO2 100.00%.   1. General white American female lying in bed in no acute distress, very pleasant, looks younger than age  67. Normal affect and insight, Not Suicidal or Homicidal, Awake Alert, Oriented X 3.  3. No F.N deficits, ALL C.Nerves Intact, Strength 5/5 all 4 extremities, Sensation intact all 4 extremities, Plantars down going.  4. Ears and Eyes appear Normal, Conjunctivae clear, PERRLA. Moist Oral Mucosa.  5. Supple Neck, No JVD, No cervical lymphadenopathy appriciated, No  Carotid Bruits.  6. Symmetrical Chest wall movement, Good air movement bilaterally, CTAB.  7. RRR, No Gallops, Rubs or Murmurs, No Parasternal Heave.  8. Positive Bowel Sounds, Abdomen Soft, Non tender, No organomegaly appriciated,No rebound -guarding or rigidity. Colostomy bag noted.  9.  No Cyanosis, Normal Skin Turgor, No Skin Rash or Bruise.  10. Good muscle tone,  joints appear normal , no effusions, Normal ROM.  11. No Palpable Lymph Nodes in Neck or Axillae    Data Review  CBC  Lab 10/08/12 1600 10/07/12 1655  WBC 11.0* 12.5*  HGB 8.7* 10.0*  HCT 27.1* 30.5*  PLT 238 310  MCV 89.1 88.2  MCH 28.6 28.9  MCHC 32.1 32.8  RDW 14.8 14.7  LYMPHSABS 1.4 1.1  MONOABS 1.4* 0.9  EOSABS 0.0 0.0  BASOSABS 0.0 0.0  BANDABS -- --   ------------------------------------------------------------------------------------------------------------------  Chemistries   Lab 10/08/12 1600 10/07/12 1655  NA 135 136  K 4.0 3.9  CL 101 98  CO2 26 26  GLUCOSE 140* 160*  BUN 28* 21  CREATININE 1.23* 0.89  CALCIUM 8.4 9.2  MG -- --  AST 36 31  ALT 35 22  ALKPHOS 121* 146*  BILITOT 0.4 0.7   ------------------------------------------------------------------------------------------------------------------     Cardiac Enzymes troponin 0.51    ---------------------------------------------------------------------------------------------------------------  Urinalysis    Component Value Date/Time   COLORURINE AMBER* 10/08/2012 1800   APPEARANCEUR CLOUDY* 10/08/2012 1800   LABSPEC 1.025 10/08/2012 1800   PHURINE 5.0 10/08/2012 1800   GLUCOSEU NEGATIVE 10/08/2012 1800   HGBUR NEGATIVE 10/08/2012 1800   BILIRUBINUR SMALL* 10/08/2012 1800   KETONESUR TRACE* 10/08/2012 1800   PROTEINUR 30* 10/08/2012 1800   UROBILINOGEN 0.2 10/08/2012 1800   NITRITE NEGATIVE 10/08/2012 1800   LEUKOCYTESUR MODERATE* 10/08/2012 1800     ----------------------------------------------------------------------------------------------------------------     Imaging results:   Dg Chest 2 View      Dg Abd Acute W/chest  10/08/2012  *RADIOLOGY REPORT*  Clinical Data: Abdominal pain, nausea/vomiting/diarrhea  ACUTE ABDOMEN SERIES (ABDOMEN 2 VIEW & CHEST 1 VIEW)  Comparison: 10/07/2012  Findings: Mild patchy left lower lobe opacity, atelectasis versus pneumonia.  Suspected right basilar atelectasis. No pleural effusion or pneumothorax.  The heart is top normal in size.  Nonobstructive bowel gas pattern.  Left lower quadrant ostomy.  No evidence of free air on the lateral decubitus view.  Degenerative changes of the visualized thoracolumbar spine.  IMPRESSION: Mild patchy left lower lobe opacity, atelectasis versus pneumonia.  No evidence of small bowel obstruction or free air.   Original Report Authenticated By: Charline Bills, M.D.     My personal review of EKG: Rhythm NSR, new Q waves in leads 3 and aVF that were not present yesterday    Assessment & Plan  1. non-ST elevation MI with new Q waves in the inferior leads compared to yesterday. Troponin is 0.51. 2. healthcare associated pneumonia, left lung 3. history of colon cancer stage III, status post sigmoid colectomy January 14, no chemotherapy. 4. Urinary tract infection. 5. Anemia with hemoglobin from 10-8.7 6. Right upper quadrant pain that started while patient in the emergency room  Plan  Admit to telemetry IV Vanco cefepime and Levaquin Serial troponins Cardiology on consult. Case discussed with cardiology on call. Aspirin and treatment dose of Lovenox Oxygen Neb treatments Check stool guaiac Check ultrasound of the gallbladder in a.m.  Addendum : Patient started having right upper quadrant pain  earlier. CT of the abdomen showed subcapsular hematoma in the liver. This is New . Will hold Lovenox.   DVT Prophylaxis Lovenox  AM Labs Ordered, also  please review Full Orders  Family Communication: Admission, patients condition and plan of care including tests being ordered have been discussed with the patient and children who indicate understanding and agree with the plan and Code Status.  Code Status full  Disposition Plan: Admit  to monitored unit then discharge to SNF when stable  Time spent in minutes : 48 minutes  Condition GUARDED

## 2012-10-08 NOTE — ED Provider Notes (Signed)
History     CSN: [id number]  Arrival date & time 10/08/12  1509   First MD Initiated Contact with Patient 10/08/12 1606      Chief Complaint  Patient presents with  . N/V/D     (Consider location/radiation/quality/duration/timing/severity/associated sxs/prior treatment) HPI This 79 year old female was recently diagnosed with colon cancer and had a laparoscopic sigmoid colectomy with colostomy within the last month and has not started any sort of chemotherapy and has an upcoming appointment with oncology to consider that within the next couple weeks. She was living independently at an apartment but is now at a skilled nursing facility for rehabilitation since her recent surgery. She was recovering well participating in physical therapy until the last few days. Over the last few days she has had constant nausea with several episodes per day of nonbloody vomiting, she also had change in her colostomy output from formed nonbloody stool to small amounts of loose nonbloody stool. She also has diffuse mild abdominal pain which is gradually been improving since her surgery and has not worsening except for occasional crampy pains over the last few days. She's had no fever no confusion no chest pain but has had exertional shortness of breath since yesterday and she has had worsening generalized weakness and fatigue with anorexia with decreased oral intake the last few days. She felt lightheaded and dehydrated yesterday's was brought to the emergency department where her labs and plain films were unremarkable. She was discharged back to the nursing home has not vomited since last night but still has nausea and was unable to eat or drink today so she was sent back to the ED for reevaluation. She still feels generally weak and needed assistance to walk to the bathroom today. The nursing home is concerned the patient may have C. difficile as she has not been tested for that. There is no treatment prior to arrival  today. Past Medical History  Diagnosis Date  . DJD (degenerative joint disease)   . Compression fracture     t12  . IBS (irritable bowel syndrome)   . Vaginal prolapse   . TMJ (temporomandibular joint disorder)   . Anemia   . Hearing loss   . Blood in stool   . Anxiety   . Cancer     Colon    Past Surgical History  Procedure Date  . Appendectomy   . Vaginal prolapse repair 2005 - approximate  . Cystectomy     back  . Nasal reconstruction with septal repair   . Skin cancer excision   . Arthroscopy left knee 2007  . Colonoscopy   . Total abdominal hysterectomy 1978  . Hernia repair 1980 - approximate  . Eye surgery [phone]    cataracts bilateral  . Back surgery     mid back  . Laparoscopic sigmoid colectomy 09/20/2012    Procedure: LAPAROSCOPIC SIGMOID COLECTOMY;  Surgeon: Emelia Loron, MD;  Location: [facility];  Service: General;  Laterality: N/A;  Laparoscopic possible Open Sigmoid Colectomy,Colostomy  . Colostomy 09/20/2012    Procedure: COLOSTOMY;  Surgeon: Emelia Loron, MD;  Location: [facility];  Service: General;  Laterality: N/A;    History reviewed. No pertinent family history.  History  Substance Use Topics  . Smoking status: Never Smoker   . Smokeless tobacco: Never Used  . Alcohol Use: No    OB History    Grav Para Term Preterm Abortions TAB SAB Ect Mult Living  Review of Systems 10 Systems reviewed and are negative for acute change except as noted in the HPI. Allergies  Codeine  Home Medications   No current outpatient prescriptions on file.  BP 127/59  Pulse 101  Temp 97.9 F (36.6 C) (Oral)  Resp 18  Ht 5\' 9"  (1.753 m)  Wt 147 lb 14.9 oz (67.1 kg)  BMI 21.85 kg/m2  SpO2 97%  Physical Exam  Nursing note and vitals reviewed. Constitutional:       Awake, alert, nontoxic appearance.  HENT:  Head: Atraumatic.  Eyes: Right eye exhibits no discharge. Left eye exhibits no discharge.  Neck: Neck supple.   Cardiovascular: Normal rate and regular rhythm.   No murmur heard. Pulmonary/Chest: Effort normal and breath sounds normal. No respiratory distress. She has no wheezes. She has no rales. She exhibits no tenderness.  Abdominal: Soft. Bowel sounds are normal. She exhibits no distension and no mass. There is tenderness. There is no rebound and no guarding.       Minimal diffuse tenderness with well-healed laparoscopic incision sites and pink moist appearing ostomy site without surrounding cellulitis noted and no rebound tenderness  Musculoskeletal: She exhibits no edema and no tenderness.       Baseline ROM, no obvious new focal weakness.  Neurological: She is alert.       Mental status and motor strength appears baseline for patient and situation.  Skin: No rash noted.  Psychiatric: She has a normal mood and affect.    ED Course  Procedures (including critical care time) ECG: Sinus rhythm, ventricular rate 91, normal axis, inferior Q waves, no definite acute ischemic changes noted, compared with ECG from yesterday left axis deviation no longer present, Q waves now present leads III and aVF  Pt stable in ED with no significant deterioration in condition.Patient / Family / Caregiver informed of clinical course, understand medical decision-making process, and agree with plan.d/w [facility] will see Pt for admit eval. 1850  Pt seen by Med for admit and while Pt still in ED had new RUQ pain per Med who requests CT Abd for Pt ordered by me per Med request.2050  Labs Reviewed  CBC WITH DIFFERENTIAL - Abnormal; Notable for the following:    WBC 11.0 (*)     RBC 3.04 (*)     Hemoglobin 8.7 (*)     HCT 27.1 (*)     Neutro Abs 8.2 (*)     Monocytes Absolute 1.4 (*)     All other components within normal limits  COMPREHENSIVE METABOLIC PANEL - Abnormal; Notable for the following:    Glucose, Bld 140 (*)     BUN 28 (*)     Creatinine, Ser 1.23 (*)     Total Protein 5.9 (*)     Albumin 2.6 (*)      Alkaline Phosphatase 121 (*)     GFR calc non Af Amer 39 (*)     GFR calc Af Amer 45 (*)     All other components within normal limits  URINALYSIS, ROUTINE W REFLEX MICROSCOPIC - Abnormal; Notable for the following:    Color, Urine AMBER (*)  BIOCHEMICALS MAY BE AFFECTED BY COLOR   APPearance CLOUDY (*)     Bilirubin Urine SMALL (*)     Ketones, ur TRACE (*)     Protein, ur 30 (*)     Leukocytes, UA MODERATE (*)     All other components within normal limits  POCT I-STAT TROPONIN I -  Abnormal; Notable for the following:    Troponin i, poc 0.51 (*)     All other components within normal limits  URINE MICROSCOPIC-ADD ON - Abnormal; Notable for the following:    Squamous Epithelial / LPF FEW (*)     Bacteria, UA FEW (*)     Casts HYALINE CASTS (*)     All other components within normal limits  CK TOTAL AND CKMB - Abnormal; Notable for the following:    CK, MB 4.1 (*)     All other components within normal limits  BASIC METABOLIC PANEL - Abnormal; Notable for the following:    Sodium 131 (*)     Glucose, Bld 140 (*)     BUN 24 (*)     Calcium 8.1 (*)     GFR calc non Af Amer 49 (*)     GFR calc Af Amer 56 (*)     All other components within normal limits  CBC - Abnormal; Notable for the following:    WBC 11.9 (*)     RBC 2.82 (*)     Hemoglobin 8.1 (*)     HCT 24.9 (*)     All other components within normal limits  TROPONIN I - Abnormal; Notable for the following:    Troponin I 0.79 (*)     All other components within normal limits  TROPONIN I - Abnormal; Notable for the following:    Troponin I 0.70 (*)     All other components within normal limits  D-DIMER, QUANTITATIVE - Abnormal; Notable for the following:    D-Dimer, Quant 3.21 (*)     All other components within normal limits  PRO B NATRIURETIC PEPTIDE - Abnormal; Notable for the following:    Pro B Natriuretic peptide (BNP) 7675.0 (*)     All other components within normal limits  LIPASE, BLOOD  LACTIC ACID, PLASMA   AMYLASE  CK TOTAL AND CKMB  STREP PNEUMONIAE URINARY ANTIGEN  MRSA PCR SCREENING  CLOSTRIDIUM DIFFICILE BY PCR  URINE CULTURE  CULTURE, BLOOD (ROUTINE X 2)  CULTURE, BLOOD (ROUTINE X 2)  CULTURE, EXPECTORATED SPUTUM-ASSESSMENT  GRAM STAIN  LEGIONELLA ANTIGEN, URINE  TROPONIN I  OSMOLALITY  SODIUM, URINE, RANDOM  OSMOLALITY, URINE  VITAMIN B12  FOLATE  IRON AND TIBC  FERRITIN  TYPE AND SCREEN  RETICULOCYTES   Mr Brain Wo Contrast  10/09/2012  *RADIOLOGY REPORT*  Clinical Data: Presyncopal episodes.  Mild dysarthria and confusion.  The examination had to be discontinued prior to completion due to pain.  MRI HEAD WITHOUT CONTRAST  Technique:  Multiplanar, multiecho pulse sequences of the brain and surrounding structures were obtained according to standard protocol without intravenous contrast.  Comparison: None.  Findings: No acute infarct, hemorrhage, or mass lesion is present. Moderate generalized atrophy is present.  Periventricular and scattered subcortical T2 hyperintensities are advanced for age as well.  The flow is present in the major intracranial arteries.  The globes and orbits are intact.  The paranasal sinuses are clear. There is some fluid in the left mastoid air cells.  No obstructing nasopharyngeal lesion is evident.  IMPRESSION:  1.  No acute intracranial abnormality. 2.  Moderate atrophy white matter disease.  This likely reflects the sequelae of chronic microvascular ischemia.   Original Report Authenticated By: Marin Roberts, M.D.    Ct Abdomen Pelvis W Contrast  10/08/2012  *RADIOLOGY REPORT*  Clinical Data: Right-sided abdominal pain.  Post colostomy 01/14.  CT ABDOMEN AND PELVIS WITH  CONTRAST  Technique:  Multidetector CT imaging of the abdomen and pelvis was performed following the standard protocol during bolus administration of intravenous contrast.  Contrast: OMNIPAQUE IOHEXOL 300 MG/ML  SOLN  Comparison: 08/26/2012  Findings: Airspace infiltration in  both lung bases, greater on the right, suggesting pneumonia.  Minimal bilateral pleural effusions. Cardiac enlargement.  Moderate sized esophageal hiatal hernia.  Since the previous study, there is interval development of a subcapsular hematoma in the right lobe of the liver.  This measures about 3.5 x 8.4 cm.  This is adjacent to the previously demonstrated focal liver lesion.  Perhaps there has been interval biopsy of this lesion leading to the hematoma.  If there has been no intervention, then hemorrhage arising from the lesion could have this appearance.  Calcified granulomas in the spleen.  The gallbladder, pancreas, adrenal glands, kidneys, and retroperitoneal lymph nodes are unremarkable.  Calcification of the abdominal aorta without aneurysm.  There have been interval postoperative changes with placement of a left upper quadrant ostomy which appears to connect to the descending colon.  The stomach, small bowel, and colon are not abnormally distended.  No free air or free fluid in the abdomen.  Pelvis:  The bladder wall is not thickened.  The uterus appears to be surgically absent.  The rectosigmoid colon is decompressed and the previously identified mass appears to been resected.  No free or loculated pelvic fluid collections.  No significant pelvic lymphadenopathy.  The appendix is not identified.  Scoliosis and degenerative changes in the lumbar spine.  Degenerative changes in the hips.  Compression and kyphoplasty changes at T12. Spondylolysis and mild spondylolisthesis at L5-S1.  IMPRESSION: Interval development of a right hepatic subcapsular hematoma which could arisen from biopsy of the right hepatic lesion or from rupture of this lesion.  Correlation with surgical history is recommended.  Interval postoperative changes with resection of a rectosigmoid mass lesion and placement of the descending colostomy. Infiltrates in both lung bases suggesting pneumonia.   Original Report Authenticated By: Burman Nieves, M.D.    Dg Abd Acute W/chest  10/08/2012  *RADIOLOGY REPORT*  Clinical Data: Abdominal pain, nausea/vomiting/diarrhea  ACUTE ABDOMEN SERIES (ABDOMEN 2 VIEW & CHEST 1 VIEW)  Comparison: 10/07/2012  Findings: Mild patchy left lower lobe opacity, atelectasis versus pneumonia.  Suspected right basilar atelectasis. No pleural effusion or pneumothorax.  The heart is top normal in size.  Nonobstructive bowel gas pattern.  Left lower quadrant ostomy.  No evidence of free air on the lateral decubitus view.  Degenerative changes of the visualized thoracolumbar spine.  IMPRESSION: Mild patchy left lower lobe opacity, atelectasis versus pneumonia.  No evidence of small bowel obstruction or free air.   Original Report Authenticated By: Charline Bills, M.D.    Dg Abd Acute W/chest  10/07/2012  *RADIOLOGY REPORT*  Clinical Data: Nausea.  Abdominal pain.  History of recent ascending colectomy.  ACUTE ABDOMEN SERIES (ABDOMEN 2 VIEW & CHEST 1 VIEW)  Comparison: 09/15/2012.  Findings: Heart size upper limits of normal for projection. Emphysematous changes in the lungs with basilar atelectasis.  There is no free air underneath the hemidiaphragms.  The bowel gas pattern is nonobstructive.  Colostomy appliance is present over the left central abdomen.  Lumbar spondylosis.  There is no small or large bowel dilation.  Surgical clips project over the left sacral ala.  Monitoring leads also project over the chest and abdomen. T12 vertebral augmentation.  IMPRESSION: Nonobstructive bowel gas pattern.  Enterostomy in the left mid  abdomen.  No acute abnormality.   Original Report Authenticated By: Andreas Newport, M.D.      1. HCAP (healthcare-associated pneumonia)   2. Elevated troponin   3. Dehydration   4. Vomiting and diarrhea   5. Non-ST elevation MI (NSTEMI)   6. Abnormal EKG   7. Pleuritic chest pain   8. Colon cancer       MDM  The patient appears reasonably stabilized for admission considering the  current resources, flow, and capabilities available in the ED at this time, and I doubt any other [facility] requiring further screening and/or treatment in the ED prior to admission.        Hurman Horn, MD 10/09/12 [phone]

## 2012-10-08 NOTE — ED Notes (Signed)
Made MD. Fonnie Jarvis aware of the I Stat  Troponin which was 0.51

## 2012-10-08 NOTE — Consult Note (Signed)
CARDIOLOGY CONSULT NOTE  Patient ID: Erin Hansen MRN: [id number] DOB/AGE: [birth year] 79 y.o.  Admit date: 10/08/2012 Primary Physician Ginette Otto, MD Primary Cardiologist  None Chief Complaint   Abnormal EKG (reason for consultation)  HPI:  The patient presents from a skilled nursing facility where she has been since discharge after colectomy for newly diagnosed colon CA.  She presents with N/V, weakness, fatigue.  She was in the ER yesterday but sent home.  She comes back with continued anorexia.  We are called as her EKG today looks different than the EKG yesterday and the initial POC troponin is mildly elevated.   She has no prior cardiac history. She reports that she was doing well postoperatively. However, the past 3 or 4 days she has had vomiting, presyncope. At one point at her nursing home for heart rate was said to be in the 120s. There was noted a change in her colostomy output. She's had anorexia persistent. She was brought back to the emergency room for this as well as question of a pneumonia. However, she's had no fevers or chills. She did have some chest discomfort but only while vomiting. She has not had neck or arm discomfort. While I was talking to her she did develop some acute epigastric and right upper quadrant abdominal discomfort. This is 10 out of 10. She's not had this before. She denies any shortness of breath, PND or orthopnea. She has had no palpitations, presyncope or syncope.   Past Medical History  Diagnosis Date  . DJD (degenerative joint disease)   . Compression fracture     t12  . IBS (irritable bowel syndrome)   . Vaginal prolapse   . TMJ (temporomandibular joint disorder)   . Anemia   . Hearing loss   . Cough   . Constipation   . Weakness   . Rectal bleeding   . Blood in stool   . Anxiety   . Cancer     Past Surgical History  Procedure Date  . Appendectomy   . Vaginal prolapse repair 2005 - approximate  . Cystectomy     back  .  Pelvic fracture surgery     reconstruction  . Nasal reconstruction with septal repair   . Skin cancer excision   . Arthroscopy left knee 2007  . Colonoscopy   . Total abdominal hysterectomy 1978  . Hernia repair 1980 - approximate  . Eye surgery [phone]    cataracts bilateral  . Back surgery     mid back  . Laparoscopic sigmoid colectomy 09/20/2012    Procedure: LAPAROSCOPIC SIGMOID COLECTOMY;  Surgeon: Emelia Loron, MD;  Location: [facility];  Service: General;  Laterality: N/A;  Laparoscopic possible Open Sigmoid Colectomy,Colostomy  . Colostomy 09/20/2012    Procedure: COLOSTOMY;  Surgeon: Emelia Loron, MD;  Location: [facility];  Service: General;  Laterality: N/A;    Allergies  Allergen Reactions  . Codeine Nausea Only   Prior to Admission medications   Medication Sig Start Date End Date Taking? Authorizing Provider  acetaminophen (TYLENOL) 500 MG tablet Take 1,000 mg by mouth every 4 (four) hours as needed. For pain   Yes Historical Provider, MD  docusate sodium (COLACE) 100 MG capsule Take 400 mg by mouth at bedtime as needed. For constipation   Yes Historical Provider, MD  erythromycin ophthalmic ointment Place 1 application into both eyes at bedtime.   Yes Historical Provider, MD  ferrous sulfate 325 (65 FE) MG tablet Take 325 mg by mouth  daily with breakfast.    Yes Historical Provider, MD  lactulose (CHRONULAC) 10 GM/15ML solution Take 40 g by mouth at bedtime.   Yes Historical Provider, MD  Melatonin 5 MG TABS Take 5 mg by mouth at bedtime as needed. For sleep   Yes Historical Provider, MD  ondansetron (ZOFRAN) 4 MG tablet Take 1 tablet (4 mg total) by mouth every 6 (six) hours. 10/07/12  Yes Dione Booze, MD  oxyCODONE-acetaminophen (PERCOCET/ROXICET) 5-325 MG per tablet Take 1-2 tablets by mouth every 4 (four) hours as needed. Moderate to severe pain. 09/27/12  Yes Emelia Loron, MD  potassium chloride SA (K-DUR,KLOR-CON) 20 MEQ tablet Take 20 mEq by mouth 2 (two)  times daily. 09/16/12  Yes Ladene Artist, MD  psyllium (METAMUCIL) 58.6 % powder Take 1 packet by mouth at bedtime. Takes following lactulose.   Yes Historical Provider, MD  sertraline (ZOLOFT) 50 MG tablet Take 75 mg by mouth every morning.   Yes Historical Provider, MD  cholecalciferol (VITAMIN D) 1000 UNITS tablet Take 1,000 Units by mouth every morning.    Historical Provider, MD  fish oil-omega-3 fatty acids 1000 MG capsule Take 1 g by mouth daily.    Historical Provider, MD  hydrochlorothiazide (HYDRODIURIL) 25 MG tablet Take 25 mg by mouth every morning.     Historical Provider, MD    (Not in a hospital admission) History reviewed. No pertinent family history.  History   Social History  . Marital Status: Widowed    Spouse Name: N/A    Number of Children: N/A  . Years of Education: N/A   Occupational History  . Not on file.   Social History Main Topics  . Smoking status: Never Smoker   . Smokeless tobacco: Never Used  . Alcohol Use: No  . Drug Use: No  . Sexually Active: Not on file   Other Topics Concern  . Not on file   Social History Narrative  . No narrative on file     ROS:  As stated in the HPI and negative for all other systems.  Physical Exam: Blood pressure 108/54, pulse 95, temperature 98.6 F (37 C), temperature source Oral, resp. rate 20, SpO2 100.00%.  GENERAL:  Uncomfortable appearing  HEENT:  Pupils equal round and reactive, fundi not visualized, oral mucosa unremarkable NECK:  No jugular venous distention, waveform within normal limits, carotid upstroke brisk and symmetric, no bruits, no thyromegaly LYMPHATICS:  No cervical, inguinal adenopathy LUNGS:  Clear to auscultation bilaterally BACK:  No CVA tenderness CHEST:  Unremarkable HEART:  PMI not displaced or sustained,S1 and S2 within normal limits, no S3, no S4, no clicks, no rubs, no murmurs ABD:  Flat, positive bowel sounds normal in frequency in pitch, no bruits, no rebound, no guarding, no  midline pulsatile mass, there is tenderness to palpation in the midepigastric area did reproduce her current pain no hepatomegaly, no splenomegaly, colostomy bag, EXT:  2 plus pulses throughout, no edema, no cyanosis no clubbing SKIN:  No rashes no nodules NEURO:  Cranial nerves II through XII grossly intact, motor grossly intact throughout PSYCH:  Cognitively intact, oriented to person place and time   Labs: Lab Results  Component Value Date   BUN 28* 10/08/2012   Lab Results  Component Value Date   CREATININE 1.23* 10/08/2012   Lab Results  Component Value Date   NA 135 10/08/2012   K 4.0 10/08/2012   CL 101 10/08/2012   CO2 26 10/08/2012    Lab Results  Component Value Date   WBC 11.0* 10/08/2012   HGB 8.7* 10/08/2012   HCT 27.1* 10/08/2012   MCV 89.1 10/08/2012   PLT 238 10/08/2012    Lab Results  Component Value Date   ALT 35 10/08/2012   AST 36 10/08/2012   ALKPHOS 121* 10/08/2012   BILITOT 0.4 10/08/2012     EKG:  NSR, rate 91, PACs, incomplete RBBB, QTc prolonged, inferior Q waves possible old inferior infarct, T wave inversion in the inferior leads nonspecific.    ASSESSMENT AND PLAN:   Abnormal EKG: Her EKG is abnormal with nondiagnostic Q waves in the inferior leads but no acute ST segment changes. The first point of care marker is mildly elevated. However, this is a rapid care marker and regular enzymes are pending. She's not currently having any symptoms consistent with acute coronary event. I would suggest a followup EKG in the morning. I will cycle troponin as well as CK-MB. She'll get an echocardiogram. However, at this point I have a low suspicion for acute coronary syndrome.   Chest pain: She had some atypical chest pain while vomiting. The predominance of her complaints are GI and somewhat acute. I have discussed this with the hospitalist. He is further evaluating her abdominal complaints.  SignedRollene Rotunda 10/08/2012, 7:48 PM

## 2012-10-08 NOTE — ED Notes (Signed)
JYN:[id number]<AO> Expected date:<BR> Expected time:<BR> Means of arrival:<BR> Comments:<BR> ems-N/V/D

## 2012-10-08 NOTE — ED Notes (Addendum)
Pt in via EMS, per EMS, pt in c/o N/V/D x4 days, seen yesterday for same, today pt became dizzy upon standing with shortness of breath, pt received chest xray today that indicated pneumonia per paperwork. Pt with decreased appetite and intake. There are some patients at nursing home with active C Diff so facility is concerned about same for patient. IV established PTA

## 2012-10-09 ENCOUNTER — Inpatient Hospital Stay (HOSPITAL_COMMUNITY): Payer: Medicare Other

## 2012-10-09 ENCOUNTER — Encounter (HOSPITAL_COMMUNITY): Payer: Self-pay | Admitting: *Deleted

## 2012-10-09 DIAGNOSIS — E860 Dehydration: Secondary | ICD-10-CM

## 2012-10-09 DIAGNOSIS — I517 Cardiomegaly: Secondary | ICD-10-CM

## 2012-10-09 DIAGNOSIS — S36112A Contusion of liver, initial encounter: Secondary | ICD-10-CM | POA: Diagnosis present

## 2012-10-09 DIAGNOSIS — R0781 Pleurodynia: Secondary | ICD-10-CM | POA: Diagnosis present

## 2012-10-09 DIAGNOSIS — R0602 Shortness of breath: Secondary | ICD-10-CM

## 2012-10-09 DIAGNOSIS — C189 Malignant neoplasm of colon, unspecified: Secondary | ICD-10-CM

## 2012-10-09 LAB — CBC
MCH: 28.7 pg (ref 26.0–34.0)
Platelets: 201 10*3/uL (ref 150–400)
RBC: 2.82 MIL/uL — ABNORMAL LOW (ref 3.87–5.11)

## 2012-10-09 LAB — IRON AND TIBC
Iron: 17 ug/dL — ABNORMAL LOW (ref 42–135)
UIBC: 243 ug/dL (ref 125–400)

## 2012-10-09 LAB — TYPE AND SCREEN: Antibody Screen: NEGATIVE

## 2012-10-09 LAB — BASIC METABOLIC PANEL
Calcium: 8.1 mg/dL — ABNORMAL LOW (ref 8.4–10.5)
GFR calc non Af Amer: 49 mL/min — ABNORMAL LOW (ref >90)
Glucose, Bld: 140 mg/dL — ABNORMAL HIGH (ref 70–99)
Sodium: 131 mEq/L — ABNORMAL LOW (ref 135–145)

## 2012-10-09 LAB — FERRITIN: Ferritin: 225 ng/mL (ref 10–291)

## 2012-10-09 LAB — LEGIONELLA ANTIGEN, URINE: Legionella Antigen, Urine: NEGATIVE

## 2012-10-09 LAB — OSMOLALITY, URINE: Osmolality, Ur: 700 mOsm/kg (ref 390–1090)

## 2012-10-09 LAB — OSMOLALITY: Osmolality: 290 mOsm/kg (ref 275–300)

## 2012-10-09 LAB — TROPONIN I: Troponin I: 0.79 ng/mL (ref <0.30)

## 2012-10-09 LAB — FOLATE: Folate: 12.2 ng/mL

## 2012-10-09 LAB — MRSA PCR SCREENING: MRSA by PCR: NEGATIVE

## 2012-10-09 LAB — PROTIME-INR: INR: 1.28 (ref 0.00–1.49)

## 2012-10-09 LAB — STREP PNEUMONIAE URINARY ANTIGEN: Strep Pneumo Urinary Antigen: NEGATIVE

## 2012-10-09 LAB — RETICULOCYTES: Retic Count, Absolute: 77.8 10*3/uL (ref 19.0–186.0)

## 2012-10-09 LAB — APTT: aPTT: 36 seconds (ref 24–37)

## 2012-10-09 MED ORDER — CHLORHEXIDINE GLUCONATE 0.12 % MT SOLN
15.0000 mL | Freq: Two times a day (BID) | OROMUCOSAL | Status: DC
  Administered 2012-10-09 – 2012-10-14 (×6): 15 mL via OROMUCOSAL
  Filled 2012-10-09 (×13): qty 15

## 2012-10-09 MED ORDER — HYDROCODONE-ACETAMINOPHEN 5-325 MG PO TABS
1.0000 | ORAL_TABLET | Freq: Four times a day (QID) | ORAL | Status: DC | PRN
  Administered 2012-10-09: 1 via ORAL
  Filled 2012-10-09: qty 1

## 2012-10-09 MED ORDER — METOPROLOL TARTRATE 25 MG PO TABS
25.0000 mg | ORAL_TABLET | Freq: Two times a day (BID) | ORAL | Status: DC
  Administered 2012-10-09 – 2012-10-14 (×11): 25 mg via ORAL
  Filled 2012-10-09 (×13): qty 1

## 2012-10-09 MED ORDER — BIOTENE DRY MOUTH MT LIQD
15.0000 mL | Freq: Two times a day (BID) | OROMUCOSAL | Status: DC
  Administered 2012-10-09 – 2012-10-12 (×8): 15 mL via OROMUCOSAL

## 2012-10-09 MED ORDER — SODIUM CHLORIDE 0.9 % IV SOLN
INTRAVENOUS | Status: AC
  Administered 2012-10-09: 75 mL/h via INTRAVENOUS

## 2012-10-09 MED ORDER — HEPARIN (PORCINE) IN NACL 100-0.45 UNIT/ML-% IJ SOLN
1050.0000 [IU]/h | INTRAMUSCULAR | Status: DC
  Administered 2012-10-09: 1050 [IU]/h via INTRAVENOUS
  Filled 2012-10-09: qty 250

## 2012-10-09 MED ORDER — ALBUTEROL SULFATE (5 MG/ML) 0.5% IN NEBU: 2.5000 mg | INHALATION_SOLUTION | Freq: Four times a day (QID) | RESPIRATORY_TRACT | Status: DC | PRN

## 2012-10-09 MED ORDER — IOHEXOL 350 MG/ML SOLN
100.0000 mL | Freq: Once | INTRAVENOUS | Status: AC | PRN
Start: 2012-10-09 — End: 2012-10-09
  Administered 2012-10-09: 100 mL via INTRAVENOUS

## 2012-10-09 NOTE — Progress Notes (Signed)
VASCULAR LAB PRELIMINARY  PRELIMINARY  PRELIMINARY  PRELIMINARY  Bilateral lower extremity venous Dopplers completed.    Preliminary report:  There is acute, occlusive DVT noted in the right posterior tibial and peroneal veins from mid to proximal calf.  All other veins appear thrombus free.  Jarek Longton, RVT 10/09/2012, 5:07 PM

## 2012-10-09 NOTE — Progress Notes (Signed)
Discuss with Pt. PICC line risk and benefits.  Pt unsure if wants PICC and wishes to wait.  Wants to talk with family. Will follow up in am

## 2012-10-09 NOTE — Progress Notes (Signed)
  Echocardiogram 2D Echocardiogram has been performed.  Erin Hansen 10/09/2012, 9:11 AM

## 2012-10-09 NOTE — Progress Notes (Signed)
CRITICAL VALUE ALERT  Critical value received:  Triponin 0.70  Date of notification:  10/09/12  Time of notification:  0939  Critical value read back:yes  Nurse who received alert:  Joaquin Music   MD notified (1st page):  Thedore Mins  Time of first page:  0940  MD notified (2nd page):  Time of second page:  Responding MD:  Thedore Mins  Time MD responded:  [phone]

## 2012-10-09 NOTE — Progress Notes (Signed)
RN was notified by the lab. that the Troponin was critical at 0.79 ng/ml  RN notified the PCP on call. Awaiting any new orders.

## 2012-10-09 NOTE — H&P (Signed)
Reason for Consult:liver hematoma   Erin Hansen is an 79 y.o. female.  HPI: called to evalaute the patient for a liver hematoma found on CT.  She began feeling weak and fell on Friday while getting ready to come to [facility] for follow up visit s/p colectomy by Dwain Sarna.  She was feeling okay until she began having some water stools and decreased output from her ostomy on Thursday and symptoms began on Friday.  In the ER she mentioned some right sided abdominal pain and CT was obtained showing the hematoma.  She has not had any fevers or chills.    Past Medical History  Diagnosis Date  . DJD (degenerative joint disease)   . Compression fracture     t12  . IBS (irritable bowel syndrome)   . Vaginal prolapse   . TMJ (temporomandibular joint disorder)   . Anemia   . Hearing loss   . Blood in stool   . Anxiety   . Cancer     Colon    Past Surgical History  Procedure Date  . Appendectomy   . Vaginal prolapse repair 2005 - approximate  . Cystectomy     back  . Nasal reconstruction with septal repair   . Skin cancer excision   . Arthroscopy left knee 2007  . Colonoscopy   . Total abdominal hysterectomy 1978  . Hernia repair 1980 - approximate  . Eye surgery [phone]    cataracts bilateral  . Back surgery     mid back  . Laparoscopic sigmoid colectomy 09/20/2012    Procedure: LAPAROSCOPIC SIGMOID COLECTOMY;  Surgeon: Emelia Loron, MD;  Location: [facility];  Service: General;  Laterality: N/A;  Laparoscopic possible Open Sigmoid Colectomy,Colostomy  . Colostomy 09/20/2012    Procedure: COLOSTOMY;  Surgeon: Emelia Loron, MD;  Location: [facility];  Service: General;  Laterality: N/A;    History reviewed. No pertinent family history.  Social History:  reports that she has never smoked. She has never used smokeless tobacco. She reports that she does not drink alcohol or use illicit drugs.  Allergies:  Allergies  Allergen Reactions  . Codeine Nausea Only    Medications: I  have reviewed the patient's current medications.  Results for orders placed during the hospital encounter of 10/08/12 (from the past 48 hour(s))  CBC WITH DIFFERENTIAL     Status: Abnormal   Collection Time   10/08/12  4:00 PM      Component Value Range Comment   WBC 11.0 (*) 4.0 - 10.5 K/uL    RBC 3.04 (*) 3.87 - 5.11 MIL/uL    Hemoglobin 8.7 (*) 12.0 - 15.0 g/dL    HCT 54.0 (*) 98.1 - 46.0 %    MCV 89.1  78.0 - 100.0 fL    MCH 28.6  26.0 - 34.0 pg    MCHC 32.1  30.0 - 36.0 g/dL    RDW 19.1  47.8 - 29.5 %    Platelets 238  150 - 400 K/uL    Neutrophils Relative 74  43 - 77 %    Neutro Abs 8.2 (*) 1.7 - 7.7 K/uL    Lymphocytes Relative 13  12 - 46 %    Lymphs Abs 1.4  0.7 - 4.0 K/uL    Monocytes Relative 12  3 - 12 %    Monocytes Absolute 1.4 (*) 0.1 - 1.0 K/uL    Eosinophils Relative 0  0 - 5 %    Eosinophils Absolute 0.0  0.0 -  0.7 K/uL    Basophils Relative 0  0 - 1 %    Basophils Absolute 0.0  0.0 - 0.1 K/uL   COMPREHENSIVE METABOLIC PANEL     Status: Abnormal   Collection Time   10/08/12  4:00 PM      Component Value Range Comment   Sodium 135  135 - 145 mEq/L    Potassium 4.0  3.5 - 5.1 mEq/L    Chloride 101  96 - 112 mEq/L    CO2 26  19 - 32 mEq/L    Glucose, Bld 140 (*) 70 - 99 mg/dL    BUN 28 (*) 6 - 23 mg/dL    Creatinine, Ser 1.61 (*) 0.50 - 1.10 mg/dL    Calcium 8.4  8.4 - 09.6 mg/dL    Total Protein 5.9 (*) 6.0 - 8.3 g/dL    Albumin 2.6 (*) 3.5 - 5.2 g/dL    AST 36  0 - 37 U/L    ALT 35  0 - 35 U/L    Alkaline Phosphatase 121 (*) 39 - 117 U/L    Total Bilirubin 0.4  0.3 - 1.2 mg/dL    GFR calc non Af Amer 39 (*) >90 mL/min    GFR calc Af Amer 45 (*) >90 mL/min   LIPASE, BLOOD     Status: Normal   Collection Time   10/08/12  4:00 PM      Component Value Range Comment   Lipase 16  11 - 59 U/L   LACTIC ACID, PLASMA     Status: Normal   Collection Time   10/08/12  4:46 PM      Component Value Range Comment   Lactic Acid, Venous 1.0  0.5 - 2.2 mmol/L   POCT  I-STAT TROPONIN I     Status: Abnormal   Collection Time   10/08/12  4:48 PM      Component Value Range Comment   Troponin i, poc 0.51 (*) 0.00 - 0.08 ng/mL    Comment NOTIFIED PHYSICIAN      Comment 3            URINALYSIS, ROUTINE W REFLEX MICROSCOPIC     Status: Abnormal   Collection Time   10/08/12  6:00 PM      Component Value Range Comment   Color, Urine AMBER (*) YELLOW BIOCHEMICALS MAY BE AFFECTED BY COLOR   APPearance CLOUDY (*) CLEAR    Specific Gravity, Urine 1.025  1.005 - 1.030    pH 5.0  5.0 - 8.0    Glucose, UA NEGATIVE  NEGATIVE mg/dL    Hgb urine dipstick NEGATIVE  NEGATIVE    Bilirubin Urine SMALL (*) NEGATIVE    Ketones, ur TRACE (*) NEGATIVE mg/dL    Protein, ur 30 (*) NEGATIVE mg/dL    Urobilinogen, UA 0.2  0.0 - 1.0 mg/dL    Nitrite NEGATIVE  NEGATIVE    Leukocytes, UA MODERATE (*) NEGATIVE   URINE MICROSCOPIC-ADD ON     Status: Abnormal   Collection Time   10/08/12  6:00 PM      Component Value Range Comment   Squamous Epithelial / LPF FEW (*) RARE    WBC, UA 11-20  <3 WBC/hpf    Bacteria, UA FEW (*) RARE    Casts HYALINE CASTS (*) NEGATIVE   AMYLASE     Status: Normal   Collection Time   10/08/12  8:40 PM      Component Value Range Comment  Amylase 55  0 - 105 U/L   CK TOTAL AND CKMB     Status: Abnormal   Collection Time   10/08/12  8:51 PM      Component Value Range Comment   Total CK 37  7 - 177 U/L    CK, MB 4.1 (*) 0.3 - 4.0 ng/mL    Relative Index RELATIVE INDEX IS INVALID  0.0 - 2.5   CK TOTAL AND CKMB     Status: Normal   Collection Time   10/09/12  3:00 AM      Component Value Range Comment   Total CK 29  7 - 177 U/L    CK, MB 3.1  0.3 - 4.0 ng/mL    Relative Index RELATIVE INDEX IS INVALID  0.0 - 2.5   BASIC METABOLIC PANEL     Status: Abnormal   Collection Time   10/09/12  3:00 AM      Component Value Range Comment   Sodium 131 (*) 135 - 145 mEq/L    Potassium 3.8  3.5 - 5.1 mEq/L    Chloride 99  96 - 112 mEq/L    CO2 21  19 - 32 mEq/L     Glucose, Bld 140 (*) 70 - 99 mg/dL    BUN 24 (*) 6 - 23 mg/dL    Creatinine, Ser 1.61  0.50 - 1.10 mg/dL    Calcium 8.1 (*) 8.4 - 10.5 mg/dL    GFR calc non Af Amer 49 (*) >90 mL/min    GFR calc Af Amer 56 (*) >90 mL/min   CBC     Status: Abnormal   Collection Time   10/09/12  3:00 AM      Component Value Range Comment   WBC 11.9 (*) 4.0 - 10.5 K/uL    RBC 2.82 (*) 3.87 - 5.11 MIL/uL    Hemoglobin 8.1 (*) 12.0 - 15.0 g/dL    HCT 09.6 (*) 04.5 - 46.0 %    MCV 88.3  78.0 - 100.0 fL    MCH 28.7  26.0 - 34.0 pg    MCHC 32.5  30.0 - 36.0 g/dL    RDW 40.9  81.1 - 91.4 %    Platelets 201  150 - 400 K/uL   TROPONIN I     Status: Abnormal   Collection Time   10/09/12  3:00 AM      Component Value Range Comment   Troponin I 0.79 (*) <0.30 ng/mL   MRSA PCR SCREENING     Status: Normal   Collection Time   10/09/12  4:52 AM      Component Value Range Comment   MRSA by PCR NEGATIVE  NEGATIVE   TROPONIN I     Status: Abnormal   Collection Time   10/09/12  9:39 AM      Component Value Range Comment   Troponin I 0.70 (*) <0.30 ng/mL   D-DIMER, QUANTITATIVE     Status: Abnormal   Collection Time   10/09/12  9:40 AM      Component Value Range Comment   D-Dimer, Quant 3.21 (*) 0.00 - 0.48 ug/mL-FEU   PRO B NATRIURETIC PEPTIDE     Status: Abnormal   Collection Time   10/09/12  9:42 AM      Component Value Range Comment   Pro B Natriuretic peptide (BNP) 7675.0 (*) 0 - 450 pg/mL     Ct Abdomen Pelvis W Contrast  10/08/2012  *RADIOLOGY REPORT*  Clinical  Data: Right-sided abdominal pain.  Post colostomy 01/14.  CT ABDOMEN AND PELVIS WITH CONTRAST  Technique:  Multidetector CT imaging of the abdomen and pelvis was performed following the standard protocol during bolus administration of intravenous contrast.  Contrast: OMNIPAQUE IOHEXOL 300 MG/ML  SOLN  Comparison: 08/26/2012  Findings: Airspace infiltration in both lung bases, greater on the right, suggesting pneumonia.  Minimal bilateral pleural  effusions. Cardiac enlargement.  Moderate sized esophageal hiatal hernia.  Since the previous study, there is interval development of a subcapsular hematoma in the right lobe of the liver.  This measures about 3.5 x 8.4 cm.  This is adjacent to the previously demonstrated focal liver lesion.  Perhaps there has been interval biopsy of this lesion leading to the hematoma.  If there has been no intervention, then hemorrhage arising from the lesion could have this appearance.  Calcified granulomas in the spleen.  The gallbladder, pancreas, adrenal glands, kidneys, and retroperitoneal lymph nodes are unremarkable.  Calcification of the abdominal aorta without aneurysm.  There have been interval postoperative changes with placement of a left upper quadrant ostomy which appears to connect to the descending colon.  The stomach, small bowel, and colon are not abnormally distended.  No free air or free fluid in the abdomen.  Pelvis:  The bladder wall is not thickened.  The uterus appears to be surgically absent.  The rectosigmoid colon is decompressed and the previously identified mass appears to been resected.  No free or loculated pelvic fluid collections.  No significant pelvic lymphadenopathy.  The appendix is not identified.  Scoliosis and degenerative changes in the lumbar spine.  Degenerative changes in the hips.  Compression and kyphoplasty changes at T12. Spondylolysis and mild spondylolisthesis at L5-S1.  IMPRESSION: Interval development of a right hepatic subcapsular hematoma which could arisen from biopsy of the right hepatic lesion or from rupture of this lesion.  Correlation with surgical history is recommended.  Interval postoperative changes with resection of a rectosigmoid mass lesion and placement of the descending colostomy. Infiltrates in both lung bases suggesting pneumonia.   Original Report Authenticated By: Burman Nieves, M.D.    Dg Abd Acute W/chest  10/08/2012  *RADIOLOGY REPORT*  Clinical Data:  Abdominal pain, nausea/vomiting/diarrhea  ACUTE ABDOMEN SERIES (ABDOMEN 2 VIEW & CHEST 1 VIEW)  Comparison: 10/07/2012  Findings: Mild patchy left lower lobe opacity, atelectasis versus pneumonia.  Suspected right basilar atelectasis. No pleural effusion or pneumothorax.  The heart is top normal in size.  Nonobstructive bowel gas pattern.  Left lower quadrant ostomy.  No evidence of free air on the lateral decubitus view.  Degenerative changes of the visualized thoracolumbar spine.  IMPRESSION: Mild patchy left lower lobe opacity, atelectasis versus pneumonia.  No evidence of small bowel obstruction or free air.   Original Report Authenticated By: Charline Bills, M.D.    Dg Abd Acute W/chest  10/07/2012  *RADIOLOGY REPORT*  Clinical Data: Nausea.  Abdominal pain.  History of recent ascending colectomy.  ACUTE ABDOMEN SERIES (ABDOMEN 2 VIEW & CHEST 1 VIEW)  Comparison: 09/15/2012.  Findings: Heart size upper limits of normal for projection. Emphysematous changes in the lungs with basilar atelectasis.  There is no free air underneath the hemidiaphragms.  The bowel gas pattern is nonobstructive.  Colostomy appliance is present over the left central abdomen.  Lumbar spondylosis.  There is no small or large bowel dilation.  Surgical clips project over the left sacral ala.  Monitoring leads also project over the chest and abdomen. T12  vertebral augmentation.  IMPRESSION: Nonobstructive bowel gas pattern.  Enterostomy in the left mid abdomen.  No acute abnormality.   Original Report Authenticated By: Andreas Newport, M.D.     All other review of systems negative or noncontributory except as stated in the HPI  Blood pressure 127/59, pulse 101, temperature 97.9 F (36.6 C), temperature source Oral, resp. rate 18, height 5\' 9"  (1.753 m), weight 147 lb 14.9 oz (67.1 kg), SpO2 97.00%. General appearance: alert, cooperative and no distress Neck: no JVD and supple, symmetrical, trachea midline Resp:  nonlabored Cardio: mild tachycardia GI: soft, mild tenderness, mild distension??, ostomy looks fine with liquid output, wounds without infection, no peritoneal signs  Assessment/Plan: Liver hematoma This is likely from her prior liver biopsy.  This was noted at the time of surgery in Dr. Doreen Salvage op note.  There is no evidence of active bleeding.  Her hgb is okay and basically unchanged from prior levels.  Her abdominal exam does not show any evidence of active bleeding.  I would just follow this clinically and it should resolve with time.  If abdominal exam worsens, or hgb declines, then would consider reimaging to evaluate for active bleeding but this should not require any surgical intervention.   Lodema Pilot DAVID 10/09/2012, 11:02 AM

## 2012-10-09 NOTE — Progress Notes (Addendum)
[facility]                                                                                Patient Demographics  Erin Hansen, is a 79 y.o. female  UUV:[id number]  KVQ:[id number]  DOB - December 08, 1925  Admit date - 10/08/2012  Admitting Physician Carron Curie, MD  Outpatient Primary MD for the patient is Ginette Otto, MD  LOS - 1   Chief Complaint  Patient presents with  . N/V/D         Assessment & Plan    1. Admitted from nursing home for right-sided pleuritic chest pain, cough along with shortness of breath, chest x-ray inconclusive questionable healthcare associated pneumonia - EKG suspicious for PE with S1, Q3 -T3 pattern, has recent surgery and hospitalization history, will check CT angiogram of the chest & Leg [place], for now continue empiric antibiotics ordered on admission yesterday, white count is improved, patient is now feeling much better and is pain-free. Appreciate cardiology input, continue to cycle troponin and follow the trend. On low-dose aspirin which will be continued, low-dose Lopressor will be added. Troponin rise so far does not appear to be in ACS pattern.  Addendum - Acute DVT +ve, CT pending but high likely hood for PE, D/E son, daughter in law and Surgeon Dr Biagio Quint, per Dr Biagio Quint safe for Hep gtt and Low dose Couamdin , if signs of new Liver Bleed IVC filter.    2. Possible healthcare associated pneumonia. For now continue antibiotics as #1 above, CT angios above, will taper down antibiotics rapidly if continues to improve. Follow cultures.     3. Non-ACS pattern troponin rise plan as #1 above. Low-dose aspirin and beta blocker to be continued.     4. Recent history of colon cancer, status post colon resection with colostomy placement, general surgery to follow, incidental finding of small liver hematoma-again surgery has been consulted, will try to avoid blood thinners high-dose antiplatelet medications.     5. UTI. Follow  cultures, antibiotics for healthcare Center pneumonia should suffice.     6. Mild hyponatremia. Will check urine sodium and osmolality along with serum osmolality and monitor.     7. Anemia. Likely anemia of chronic disease we'll check an anemia panel.     Code Status: Full  Family Communication: With the patient  Disposition Plan: SNF   Procedures echo gram, MRI head, CT and your chest, CT abdomen pelvis   Consults  Cards   DVT Prophylaxis  SCDs (once [place] -ve)  Lab Results  Component Value Date   PLT 201 10/09/2012    Medications  Scheduled Meds:   . albuterol  2.5 mg Nebulization Q6H  . antiseptic oral rinse  15 mL Mouth Rinse q12n4p  . aspirin EC  81 mg Oral Daily  . ceFEPime (MAXIPIME) IV  1 g Intravenous Q12H  . chlorhexidine  15 mL Mouth Rinse BID  . erythromycin  1 application Both Eyes QHS  . ferrous sulfate  325 mg Oral QPC breakfast  . lactulose  40 g Oral QHS  . levofloxacin (LEVAQUIN) IV  750 mg Intravenous Q48H  . pantoprazole (PROTONIX) IV  40 mg Intravenous QHS  .  potassium chloride SA  20 mEq Oral BID  . sertraline  75 mg Oral q morning - 10a  . sodium chloride  3 mL Intravenous Q12H  . sodium chloride  3 mL Intravenous Q12H  . vancomycin  1,000 mg Intravenous Q24H   Continuous Infusions:   . sodium chloride     PRN Meds:.sodium chloride, acetaminophen, HYDROcodone-acetaminophen, morphine injection, ondansetron (ZOFRAN) IV, sodium chloride, zolpidem  Antibiotics    Anti-infectives     Start     Dose/Rate Route Frequency Ordered Stop   10/10/12 1800   levofloxacin (LEVAQUIN) IVPB 750 mg        750 mg 100 mL/hr over 90 Minutes Intravenous Every 48 hours 10/08/12 1850 10/12/12 1759   10/09/12 1000   vancomycin (VANCOCIN) IVPB 1000 mg/200 mL premix        1,000 mg 200 mL/hr over 60 Minutes Intravenous Every 24 hours 10/08/12 1850 10/17/12 0959   10/08/12 2345   ceFEPIme (MAXIPIME) 1 g in dextrose 5 % 50 mL IVPB  Status:  Discontinued         1 g 100 mL/hr over 30 Minutes Intravenous 3 times per day 10/08/12 2342 10/08/12 2351   10/08/12 2345   levofloxacin (LEVAQUIN) IVPB 750 mg  Status:  Discontinued        750 mg 100 mL/hr over 90 Minutes Intravenous Every 24 hours 10/08/12 2342 10/08/12 2353   10/08/12 2000   ceFEPIme (MAXIPIME) 1 g in dextrose 5 % 50 mL IVPB  Status:  Discontinued        1 g 100 mL/hr over 30 Minutes Intravenous Every 8 hours 10/08/12 1820 10/08/12 1850   10/08/12 2000   ceFEPIme (MAXIPIME) 1 g in dextrose 5 % 50 mL IVPB        1 g 100 mL/hr over 30 Minutes Intravenous Every 12 hours 10/08/12 1850 10/16/12 2159   10/08/12 2000   vancomycin (VANCOCIN) IVPB 1000 mg/200 mL premix        1,000 mg 200 mL/hr over 60 Minutes Intravenous  Once 10/08/12 1850 10/08/12 2233   10/08/12 1830   levofloxacin (LEVAQUIN) IVPB 750 mg  Status:  Discontinued        750 mg 100 mL/hr over 90 Minutes Intravenous Every 24 hours 10/08/12 1820 10/08/12 1850           Time Spent in minutes   35   SINGH,PRASHANT K M.D on 10/09/2012 at 9:37 AM  Between 7am to 7pm - Pager - [phone]  After 7pm go to [email or web address] - password [email or web address]  And look for the night coverage person covering for me after hours  [facility] Office  [phone]    Subjective:   Erin Hansen today has, No headache, No chest pain, No abdominal pain - No Nausea, No new weakness tingling or numbness, No Cough - SOB.mildly confused.  Objective:   Filed Vitals:   10/08/12 1925 10/08/12 2330 10/09/12 0239 10/09/12 0519  BP: 108/54 121/63  127/59  Pulse: 95 106  101  Temp:  97.5 F (36.4 C)  97.9 F (36.6 C)  TempSrc:  Oral  Oral  Resp: 20 18  18   Height:  5\' 9"  (1.753 m)    Weight:  67.1 kg (147 lb 14.9 oz)    SpO2: 100% 99% 98% 93%    Wt Readings from Last 3 Encounters:  10/08/12 67.1 kg (147 lb 14.9 oz)  09/22/12 75.8 kg (167 lb 1.7 oz)  09/22/12 75.8 kg (167 lb  1.7 oz)     Intake/Output Summary (Last 24 hours)  at 10/09/12 0937 Last data filed at 10/09/12 0753  Gross per 24 hour  Intake      0 ml  Output    125 ml  Net   -125 ml    Exam Awake Alert, Oriented X 1, No new F.N deficits, Normal affect [place].AT,PERRAL Supple Neck,No JVD, No cervical lymphadenopathy appriciated.  Symmetrical Chest wall movement, Good air movement bilaterally, CTAB RRR,No Gallops,Rubs or new Murmurs, No Parasternal Heave +ve B.Sounds, Abd Soft, Non tender, No organomegaly appriciated, No rebound - guarding or rigidity. No Cyanosis, Clubbing or edema, No new Rash or bruise     Data Review   Micro Results Recent Results (from the past 240 hour(s))  MRSA PCR SCREENING     Status: Normal   Collection Time   10/09/12  4:52 AM      Component Value Range Status Comment   MRSA by PCR NEGATIVE  NEGATIVE Final     Radiology Reports Dg Chest 2 View  09/15/2012  *RADIOLOGY REPORT*  Clinical Data: History of colon carcinoma; preoperative colectomy  CHEST - 2 VIEW  Comparison: Chest radiograph September 10, 2011; chest CT August 26, 2012  Findings:  There is mild scarring in the left lung base.  The lungs are otherwise clear.  Heart size and pulmonary vascularity are within normal limits.  There is a hiatal hernia. No adenopathy.  There is marked collapse of the T12 vertebral body, a stable finding.  There is localized kyphosis in the area of T12.  IMPRESSION:   Lungs are clear except for stable left base scarring. No adenopathy.  Stable marked collapse of the T12 vertebral body.   Original Report Authenticated By: Bretta Bang, M.D.    Ct Abdomen Pelvis W Contrast  10/08/2012  *RADIOLOGY REPORT*  Clinical Data: Right-sided abdominal pain.  Post colostomy 01/14.  CT ABDOMEN AND PELVIS WITH CONTRAST  Technique:  Multidetector CT imaging of the abdomen and pelvis was performed following the standard protocol during bolus administration of intravenous contrast.  Contrast: OMNIPAQUE IOHEXOL 300 MG/ML  SOLN  Comparison:  08/26/2012  Findings: Airspace infiltration in both lung bases, greater on the right, suggesting pneumonia.  Minimal bilateral pleural effusions. Cardiac enlargement.  Moderate sized esophageal hiatal hernia.  Since the previous study, there is interval development of a subcapsular hematoma in the right lobe of the liver.  This measures about 3.5 x 8.4 cm.  This is adjacent to the previously demonstrated focal liver lesion.  Perhaps there has been interval biopsy of this lesion leading to the hematoma.  If there has been no intervention, then hemorrhage arising from the lesion could have this appearance.  Calcified granulomas in the spleen.  The gallbladder, pancreas, adrenal glands, kidneys, and retroperitoneal lymph nodes are unremarkable.  Calcification of the abdominal aorta without aneurysm.  There have been interval postoperative changes with placement of a left upper quadrant ostomy which appears to connect to the descending colon.  The stomach, small bowel, and colon are not abnormally distended.  No free air or free fluid in the abdomen.  Pelvis:  The bladder wall is not thickened.  The uterus appears to be surgically absent.  The rectosigmoid colon is decompressed and the previously identified mass appears to been resected.  No free or loculated pelvic fluid collections.  No significant pelvic lymphadenopathy.  The appendix is not identified.  Scoliosis and degenerative changes in the lumbar spine.  Degenerative changes  in the hips.  Compression and kyphoplasty changes at T12. Spondylolysis and mild spondylolisthesis at L5-S1.  IMPRESSION: Interval development of a right hepatic subcapsular hematoma which could arisen from biopsy of the right hepatic lesion or from rupture of this lesion.  Correlation with surgical history is recommended.  Interval postoperative changes with resection of a rectosigmoid mass lesion and placement of the descending colostomy. Infiltrates in both lung bases suggesting  pneumonia.   Original Report Authenticated By: Burman Nieves, M.D.    US Biopsy  09/14/2012  *RADIOLOGY REPORT*  Clinical Data: Recent diagnosis of carcinoma of the rectosigmoid colon.  Ill-defined liver lesion was visualized by CT in the right lobe of the liver and the patient presents for liver biopsy.  ULTRASOUND GUIDED CORE BIOPSY OF LIVER  Sedation:  1.0 mg IV Versed;  50 mcg IV Fentanyl  Total Moderate Sedation Time: 18 minutes.  Procedure:  The procedure, risks, benefits, and alternatives were explained to the patient.  Questions regarding the procedure were encouraged and answered.  The patient understands and consents to the procedure.  The abdominal wall was prepped with Betadine in a sterile fashion, and a sterile drape was applied covering the operative field.  A sterile gown and sterile gloves were used for the procedure. Local anesthesia was provided with 1% Lidocaine.  Preliminary ultrasound was performed of the liver.  After localizing a lesion in the right lobe, a 17 gauge needle was advanced under direct ultrasound guidance to the level of the lesion.  Coaxial 18-gauge core biopsy samples were obtained.  Three core biopsy samples were submitted in formalin.  The outer needle was removed and additional ultrasound performed.  Complications: None  Findings: A relatively hyperechoic lesion is visualized in the right lobe of the liver in a location corresponding to the abnormality detected by CT.  This lesion measures approximately 1.6 cm in greatest diameter by ultrasound.  Solid tissue was obtained from the region of the lesion with core biopsy.  IMPRESSION: Ultrasound guided core biopsy performed of a lesion in the right lobe of the liver.  This lesion corresponds in location to the lesion depicted by CT and measures approximately 1.6 cm by ultrasound.   Original Report Authenticated By: Irish Lack, M.D.    Dg Abd Acute W/chest  10/08/2012  *RADIOLOGY REPORT*  Clinical Data: Abdominal pain,  nausea/vomiting/diarrhea  ACUTE ABDOMEN SERIES (ABDOMEN 2 VIEW & CHEST 1 VIEW)  Comparison: 10/07/2012  Findings: Mild patchy left lower lobe opacity, atelectasis versus pneumonia.  Suspected right basilar atelectasis. No pleural effusion or pneumothorax.  The heart is top normal in size.  Nonobstructive bowel gas pattern.  Left lower quadrant ostomy.  No evidence of free air on the lateral decubitus view.  Degenerative changes of the visualized thoracolumbar spine.  IMPRESSION: Mild patchy left lower lobe opacity, atelectasis versus pneumonia.  No evidence of small bowel obstruction or free air.   Original Report Authenticated By: Charline Bills, M.D.    Dg Abd Acute W/chest  10/07/2012  *RADIOLOGY REPORT*  Clinical Data: Nausea.  Abdominal pain.  History of recent ascending colectomy.  ACUTE ABDOMEN SERIES (ABDOMEN 2 VIEW & CHEST 1 VIEW)  Comparison: 09/15/2012.  Findings: Heart size upper limits of normal for projection. Emphysematous changes in the lungs with basilar atelectasis.  There is no free air underneath the hemidiaphragms.  The bowel gas pattern is nonobstructive.  Colostomy appliance is present over the left central abdomen.  Lumbar spondylosis.  There is no small or large bowel dilation.  Surgical clips project over the left sacral ala.  Monitoring leads also project over the chest and abdomen. T12 vertebral augmentation.  IMPRESSION: Nonobstructive bowel gas pattern.  Enterostomy in the left mid abdomen.  No acute abnormality.   Original Report Authenticated By: Andreas Newport, M.D.     CBC  Lab 10/09/12 0300 10/08/12 1600 10/07/12 1655  WBC 11.9* 11.0* 12.5*  HGB 8.1* 8.7* 10.0*  HCT 24.9* 27.1* 30.5*  PLT 201 238 310  MCV 88.3 89.1 88.2  MCH 28.7 28.6 28.9  MCHC 32.5 32.1 32.8  RDW 14.8 14.8 14.7  LYMPHSABS -- 1.4 1.1  MONOABS -- 1.4* 0.9  EOSABS -- 0.0 0.0  BASOSABS -- 0.0 0.0  BANDABS -- -- --    Chemistries   Lab 10/09/12 0300 10/08/12 1600 10/07/12 1655  NA 131* 135  136  K 3.8 4.0 3.9  CL 99 101 98  CO2 21 26 26   GLUCOSE 140* 140* 160*  BUN 24* 28* 21  CREATININE 1.02 1.23* 0.89  CALCIUM 8.1* 8.4 9.2  MG -- -- --  AST -- 36 31  ALT -- 35 22  ALKPHOS -- 121* 146*  BILITOT -- 0.4 0.7   ------------------------------------------------------------------------------------------------------------------ estimated creatinine clearance is 42.1 ml/min (by C-G formula based on Cr of 1.02). ------------------------------------------------------------------------------------------------------------------ No results found for this basename: HGBA1C:2 in the last 72 hours ------------------------------------------------------------------------------------------------------------------ No results found for this basename: CHOL:2,HDL:2,LDLCALC:2,TRIG:2,CHOLHDL:2,LDLDIRECT:2 in the last 72 hours ------------------------------------------------------------------------------------------------------------------ No results found for this basename: TSH,T4TOTAL,FREET3,T3FREE,THYROIDAB in the last 72 hours ------------------------------------------------------------------------------------------------------------------ No results found for this basename: VITAMINB12:2,FOLATE:2,FERRITIN:2,TIBC:2,IRON:2,RETICCTPCT:2 in the last 72 hours  Coagulation profile No results found for this basename: INR:5,PROTIME:5 in the last 168 hours  No results found for this basename: DDIMER:2 in the last 72 hours  Cardiac Enzymes  Lab 10/09/12 0300 10/08/12 2051  CKMB 3.1 4.1*  TROPONINI 0.79* --  MYOGLOBIN -- --   ------------------------------------------------------------------------------------------------------------------ No components found with this basename: POCBNP:3

## 2012-10-09 NOTE — Progress Notes (Signed)
ANTICOAGULATION CONSULT NOTE - Initial Consult  Pharmacy Consult for Heparin Indication: pulmonary embolus  Allergies  Allergen Reactions  . Codeine Nausea Only    Patient Measurements: Height: 5\' 9"  (175.3 cm) Weight: 147 lb 14.9 oz (67.1 kg) IBW/kg (Calculated) : 66.2     Vital Signs: Temp: 97.7 F (36.5 C) (02/02 1415) Temp src: Oral (02/02 1415) BP: 109/68 mmHg (02/02 1415) Pulse Rate: 89  (02/02 1415)  Labs:  Basename 10/09/12 1522 10/09/12 0939 10/09/12 0300 10/08/12 2051 10/08/12 1600 10/07/12 1655  HGB -- -- 8.1* -- 8.7* --  HCT -- -- 24.9* -- 27.1* 30.5*  PLT -- -- 201 -- 238 310  APTT -- -- -- -- -- --  LABPROT -- -- -- -- -- --  INR -- -- -- -- -- --  HEPARINUNFRC -- -- -- -- -- --  CREATININE -- -- 1.02 -- 1.23* 0.89  CKTOTAL -- -- 29 37 -- --  CKMB -- -- 3.1 4.1* -- --  TROPONINI 0.36* 0.70* 0.79* -- -- --    Estimated Creatinine Clearance: 42.1 ml/min (by C-G formula based on Cr of 1.02).   Medical History: Past Medical History  Diagnosis Date  . DJD (degenerative joint disease)   . Compression fracture     t12  . IBS (irritable bowel syndrome)   . Vaginal prolapse   . TMJ (temporomandibular joint disorder)   . Anemia   . Hearing loss   . Blood in stool   . Anxiety   . Cancer     Colon    Medications:  Prescriptions prior to admission  Medication Sig Dispense Refill  . acetaminophen (TYLENOL) 500 MG tablet Take 1,000 mg by mouth every 4 (four) hours as needed. For pain      . docusate sodium (COLACE) 100 MG capsule Take 400 mg by mouth at bedtime as needed. For constipation      . erythromycin ophthalmic ointment Place 1 application into both eyes at bedtime.      . ferrous sulfate 325 (65 FE) MG tablet Take 325 mg by mouth daily with breakfast.       . lactulose (CHRONULAC) 10 GM/15ML solution Take 40 g by mouth at bedtime.      . Melatonin 5 MG TABS Take 5 mg by mouth at bedtime as needed. For sleep      . ondansetron (ZOFRAN) 4 MG  tablet Take 1 tablet (4 mg total) by mouth every 6 (six) hours.  20 tablet  0  . oxyCODONE-acetaminophen (PERCOCET/ROXICET) 5-325 MG per tablet Take 1-2 tablets by mouth every 4 (four) hours as needed. Moderate to severe pain.      . potassium chloride SA (K-DUR,KLOR-CON) 20 MEQ tablet Take 20 mEq by mouth 2 (two) times daily.      . psyllium (METAMUCIL) 58.6 % powder Take 1 packet by mouth at bedtime. Takes following lactulose.      . sertraline (ZOLOFT) 50 MG tablet Take 75 mg by mouth every morning.      . cholecalciferol (VITAMIN D) 1000 UNITS tablet Take 1,000 Units by mouth every morning.      . fish oil-omega-3 fatty acids 1000 MG capsule Take 1 g by mouth daily.      . hydrochlorothiazide (HYDRODIURIL) 25 MG tablet Take 25 mg by mouth every morning.        Scheduled:    . albuterol  2.5 mg Nebulization Q6H  . antiseptic oral rinse  15 mL Mouth Rinse q12n4p  .  aspirin EC  81 mg Oral Daily  . ceFEPime (MAXIPIME) IV  1 g Intravenous Q12H  . chlorhexidine  15 mL Mouth Rinse BID  . erythromycin  1 application Both Eyes QHS  . ferrous sulfate  325 mg Oral QPC breakfast  . lactulose  40 g Oral QHS  . levofloxacin (LEVAQUIN) IV  750 mg Intravenous Q48H  . metoprolol tartrate  25 mg Oral BID  . [COMPLETED] ondansetron (ZOFRAN) IV  4 mg Intravenous Once  . pantoprazole (PROTONIX) IV  40 mg Intravenous QHS  . potassium chloride SA  20 mEq Oral BID  . sertraline  75 mg Oral q morning - 10a  . sodium chloride  3 mL Intravenous Q12H  . sodium chloride  3 mL Intravenous Q12H  . [COMPLETED] vancomycin  1,000 mg Intravenous Once  . vancomycin  1,000 mg Intravenous Q24H  . [DISCONTINUED] ceFEPime (MAXIPIME) IV  1 g Intravenous Q8H  . [DISCONTINUED] ceFEPime (MAXIPIME) IV  1 g Intravenous Q8H  . [DISCONTINUED] enoxaparin (LOVENOX) injection  70 mg Subcutaneous Q24H  . [DISCONTINUED] levofloxacin (LEVAQUIN) IV  750 mg Intravenous Q24H  . [DISCONTINUED] levofloxacin (LEVAQUIN) IV  750 mg  Intravenous Q24H   Infusions:    . sodium chloride 75 mL/hr (10/09/12 1219)  . [DISCONTINUED] sodium chloride 125 mL/hr at 10/09/12 0721   PRN: sodium chloride, acetaminophen, HYDROcodone-acetaminophen, [COMPLETED] iohexol, [COMPLETED] iohexol, morphine injection, ondansetron (ZOFRAN) IV, sodium chloride, zolpidem, [DISCONTINUED] acetaminophen, [DISCONTINUED] HYDROcodone-acetaminophen, [DISCONTINUED] ondansetron  Assessment: 87 YOF w/DVT/PE, DVT confirmed, high likelihood of PE, CT angio pending. Pharmacy asked to dose Heparin, no bolus and aim for low end tx range d/t CT showing subcapsular hematoma in R lobe of liver  Goal of Therapy:  Heparin level 0.3-0.7 units/ml --aim for low end tx range Monitor platelets by anticoagulation protocol: Yes   Plan:  Baseline anticoag labs (PT/INR, PTT) Heparin 1050 units/hour HL 8 hr after start of infusion and daily Daily CBC  Gwen Her PharmD  [phone] 10/09/2012 6:05 PM

## 2012-10-09 NOTE — Evaluation (Signed)
Physical Therapy Evaluation Patient Details Name: Erin Hansen MRN: [id number] DOB: Jan 30, 1926 Today's Date: 10/09/2012 Time: 4098-1191 PT Time Calculation (min): 25 min  PT Assessment / Plan / Recommendation Clinical Impression  Pt presents with NSTEMI with recent D/C in Dec 2013 s/p colectomy.  Demonstrates increased weakness and fatigues very quickly.  Performed all mobility on RA with O2 sats at 88% once in chair.  HR remained in 90's throughout.  RN notified.  Pt will benefit from skilled PT in acute venue to address deficits.  PT recommends SNF at [facility] for increased safety and to improve mobility for eventual transition back to independent living.     PT Assessment  Patient needs continued PT services    Follow Up Recommendations  SNF    Does the patient have the potential to tolerate intense rehabilitation      Barriers to Discharge Decreased caregiver support      Equipment Recommendations  None recommended by PT    Recommendations for Other Services OT consult   Frequency Min 3X/week    Precautions / Restrictions Precautions Precautions: Fall Precaution Comments: recent colectomy with colostomy Restrictions Weight Bearing Restrictions: No   Pertinent Vitals/Pain No pain stated during session      Mobility  Bed Mobility Bed Mobility: Supine to Sit;Sitting - Scoot to Edge of Bed Supine to Sit: 3: Mod assist Sitting - Scoot to Edge of Bed: 4: Min assist;3: Mod assist Details for Bed Mobility Assistance: Requires assist for hips and trunk to obtain sitting position.  Utilized bed pad to assist with scooting.  Max cues for attending to task as she was very talkative and would get distracted easily.   Transfers Transfers: Sit to Stand;Stand to Sit;Stand Pivot Transfers Sit to Stand: 3: Mod assist;From elevated surface;With upper extremity assist;From bed Stand to Sit: 4: Min assist;With upper extremity assist;With armrests;To chair/3-in-1 Stand Pivot  Transfers: 4: Min assist;3: Mod assist Details for Transfer Assistance: Assist to rise with cues for hand placement, safety and to ensure controlled descent.  Noted pt very weak and fatigues easily when standing.  Performed x 2 in order to allow rest breaks. Able to take some steps from bed to chair, however noted steps are shuffled and requires assist to steady throughout.  cues for technique and use of RW.  Ambulation/Gait Ambulation/Gait Assistance: Not tested (comment) Stairs: No Wheelchair Mobility Wheelchair Mobility: No    Shoulder Instructions     Exercises Total Joint Exercises Ankle Circles/Pumps: AROM;Both;10 reps   PT Diagnosis: Difficulty walking;Generalized weakness  PT Problem List: Decreased strength;Decreased activity tolerance;Decreased balance;Decreased mobility PT Treatment Interventions: DME instruction;Gait training;Functional mobility training;Therapeutic activities;Therapeutic exercise;Balance training;Patient/family education   PT Goals Acute Rehab PT Goals PT Goal Formulation: With patient Time For Goal Achievement: 10/23/12 Potential to Achieve Goals: Good Pt will go Supine/Side to Sit: with supervision PT Goal: Supine/Side to Sit - Progress: Goal set today Pt will go Sit to Supine/Side: with supervision PT Goal: Sit to Supine/Side - Progress: Goal set today Pt will go Sit to Stand: with supervision PT Goal: Sit to Stand - Progress: Goal set today Pt will Ambulate: with min assist;with least restrictive assistive device;51 - 150 feet PT Goal: Ambulate - Progress: Goal set today  Visit Information  Last PT Received On: 10/09/12 Assistance Needed: +2 (for safety if amb)    Subjective Data  Subjective: There are so many nice people here.  Patient Stated Goal: to return to [facility].    Prior Functioning  Home Living Lives With: Alone Type of Home: Independent living facility ([facility]) Home Access: Level entry;Elevator (apt on 3rd  floor) Home Layout: One level Bathroom Shower/Tub: Walk-in Stage manager: Handicapped height Home Adaptive Equipment: Walker - rolling;Straight cane Prior Function Level of Independence: Independent with assistive device(s);Needs assistance Needs Assistance: Meal Prep Meal Prep: Total Able to Take Stairs?: No Driving: No Communication Communication: HOH    Cognition  Overall Cognitive Status: Appears within functional limits for tasks assessed/performed Arousal/Alertness: Lethargic Orientation Level: Appears intact for tasks assessed Behavior During Session: Lethargic    Extremity/Trunk Assessment Right Lower Extremity Assessment RLE ROM/Strength/Tone: Deficits RLE ROM/Strength/Tone Deficits: Pt with generalized weakness/fatigue during session, grossly 3/5 per functional assessment.  RLE Sensation: WFL - Light Touch Left Lower Extremity Assessment LLE ROM/Strength/Tone: Deficits LLE ROM/Strength/Tone Deficits: Pt with generalized weakness/fatigue during session, grossly 3/5 per functional assessment.  LLE Sensation: WFL - Light Touch Trunk Assessment Trunk Assessment: Kyphotic   Balance    End of Session PT - End of Session Equipment Utilized During Treatment: Gait belt Activity Tolerance: Patient limited by fatigue Patient left: in chair;with call bell/phone within reach Nurse Communication: Mobility status  GP     Vista Deck 10/09/2012, 3:16 PM

## 2012-10-09 NOTE — Progress Notes (Addendum)
Radiologist called. CT scan shows positive for pulmonary emboli. On call MD notified. No immediate changes in VTE regimen. I will monitor pt for s/s of PE.

## 2012-10-09 NOTE — Progress Notes (Addendum)
Patient Name: Erin Hansen Date of Encounter: 10/09/2012    SUBJECTIVE: 4 days ago she had a near fainting episode and severe dyspnea. She has been having nausea and vomiting.  TELEMETRY:  Normal sinus rhythm: Filed Vitals:   10/08/12 1925 10/08/12 2330 10/09/12 0239 10/09/12 0519  BP: 108/54 121/63  127/59  Pulse: 95 106  101  Temp:  97.5 F (36.4 C)  97.9 F (36.6 C)  TempSrc:  Oral  Oral  Resp: 20 18  18   Height:  5\' 9"  (1.753 m)    Weight:  67.1 kg (147 lb 14.9 oz)    SpO2: 100% 99% 98% 93%    Intake/Output Summary (Last 24 hours) at 10/09/12 0807 Last data filed at 10/09/12 0753  Gross per 24 hour  Intake      0 ml  Output    125 ml  Net   -125 ml    LABS: Basic Metabolic Panel:  Basename 10/09/12 0300 10/08/12 1600  NA 131* 135  K 3.8 4.0  CL 99 101  CO2 21 26  GLUCOSE 140* 140*  BUN 24* 28*  CREATININE 1.02 1.23*  CALCIUM 8.1* 8.4  MG -- --  PHOS -- --   CBC:  Basename 10/09/12 0300 10/08/12 1600 10/07/12 1655  WBC 11.9* 11.0* --  NEUTROABS -- 8.2* 10.5*  HGB 8.1* 8.7* --  HCT 24.9* 27.1* --  MCV 88.3 89.1 --  PLT 201 238 --   Cardiac Enzymes:  Basename 10/09/12 0300 10/08/12 2051  CKTOTAL 29 37  CKMB 3.1 4.1*  CKMBINDEX -- --  TROPONINI 0.79* --   ECG: with IRBBB and new inferior Q waves. Also "S1Q3T3" pattern  Radiology/Studies:  IMPRESSION:  Mild patchy left lower lobe opacity, atelectasis versus pneumonia.  No evidence of small bowel obstruction or free air.  Original Report Authenticated By: Charline Bills, M.D.    Physical Exam: Blood pressure 127/59, pulse 101, temperature 97.9 F (36.6 C), temperature source Oral, resp. rate 18, height 5\' 9"  (1.753 m), weight 67.1 kg (147 lb 14.9 oz), SpO2 93.00%. Weight change:    Split S2 on exam related to right bundle branch block. Rule out pulmonary hypertension  Chest is clear anteriorly  No lower extremity edema  ASSESSMENT:  1. Right chest pain, possibly related to pneumonia or  pulmonary infarcts  2. Abnormal EKG with recent change now demonstrating inferior Q waves. This EKG could also be compatible with the classic "S1Q3T3" pattern of pulmonary embolism  3. I doubt acute coronary syndrome.  4. Hepatic hematoma, subcapsular  Plan:  1. d-dimer and BNP  2. Consider CT Angio of lungs with contrast if d-dimer is extremely elevated  3. The hepatic hematoma would make anticoagulation difficult  4. 2 D doppler echo to assess RVSP and wall motion  Signed, Lesleigh Noe 10/09/2012, 8:07 AM

## 2012-10-10 ENCOUNTER — Telehealth (INDEPENDENT_AMBULATORY_CARE_PROVIDER_SITE_OTHER): Payer: Self-pay

## 2012-10-10 DIAGNOSIS — I2699 Other pulmonary embolism without acute cor pulmonale: Secondary | ICD-10-CM | POA: Diagnosis present

## 2012-10-10 LAB — HEPARIN LEVEL (UNFRACTIONATED)
Heparin Unfractionated: 0.31 IU/mL (ref 0.30–0.70)
Heparin Unfractionated: 0.26 IU/mL — ABNORMAL LOW (ref 0.30–0.70)
Heparin Unfractionated: 0.14 IU/mL — ABNORMAL LOW (ref 0.30–0.70)

## 2012-10-10 LAB — BASIC METABOLIC PANEL
BUN: 24 mg/dL — ABNORMAL HIGH (ref 6–23)
CO2: 20 mEq/L (ref 19–32)
Chloride: 100 mEq/L (ref 96–112)
GFR calc non Af Amer: 57 mL/min — ABNORMAL LOW (ref >90)
Glucose, Bld: 149 mg/dL — ABNORMAL HIGH (ref 70–99)
Potassium: 4.2 mEq/L (ref 3.5–5.1)

## 2012-10-10 LAB — CBC
HCT: 25.8 % — ABNORMAL LOW (ref 36.0–46.0)
Hemoglobin: 8.2 g/dL — ABNORMAL LOW (ref 12.0–15.0)
MCHC: 31.8 g/dL (ref 30.0–36.0)
RBC: 2.91 MIL/uL — ABNORMAL LOW (ref 3.87–5.11)

## 2012-10-10 LAB — URINE CULTURE

## 2012-10-10 LAB — SODIUM, URINE, RANDOM: Sodium, Ur: 28 mEq/L

## 2012-10-10 MED ORDER — ENSURE COMPLETE PO LIQD
237.0000 mL | Freq: Two times a day (BID) | ORAL | Status: DC
  Administered 2012-10-11 – 2012-10-14 (×5): 237 mL via ORAL

## 2012-10-10 MED ORDER — HEPARIN (PORCINE) IN NACL 100-0.45 UNIT/ML-% IJ SOLN
1250.0000 [IU]/h | INTRAMUSCULAR | Status: DC
  Filled 2012-10-10: qty 250

## 2012-10-10 MED ORDER — HEPARIN (PORCINE) IN NACL 100-0.45 UNIT/ML-% IJ SOLN
1150.0000 [IU]/h | INTRAMUSCULAR | Status: DC
  Administered 2012-10-10: 1150 [IU]/h via INTRAVENOUS
  Filled 2012-10-10 (×3): qty 250

## 2012-10-10 NOTE — Progress Notes (Addendum)
Patient Name: Erin Hansen Date of Encounter: 10/10/2012    SUBJECTIVE:  The patient is difficult to arouse. She denies chest pain and dyspnea. The w/u yesterday was very revealing.  TELEMETRY:  NSR and ST: Filed Vitals:   10/09/12 1415 10/09/12 1435 10/09/12 2059 10/10/12 0624  BP: 109/68  107/67 137/76  Pulse: 89  90 96  Temp: 97.7 F (36.5 C)  98 F (36.7 C) 97.3 F (36.3 C)  TempSrc: Oral  Oral Oral  Resp: 19  20 20   Height:      Weight:    69.31 kg (152 lb 12.8 oz)  SpO2: 98% 97% 97% 93%    Intake/Output Summary (Last 24 hours) at 10/10/12 0824 Last data filed at 10/10/12 0755  Gross per 24 hour  Intake 851.25 ml  Output    725 ml  Net 126.25 ml    LABS: Basic Metabolic Panel:  Basename 10/10/12 0218 10/09/12 0300  NA 132* 131*  K 4.2 3.8  CL 100 99  CO2 20 21  GLUCOSE 149* 140*  BUN 24* 24*  CREATININE 0.90 1.02  CALCIUM 8.6 8.1*  MG -- --  PHOS -- --   CBC:  Basename 10/10/12 0218 10/09/12 0300 10/08/12 1600 10/07/12 1655  WBC 12.8* 11.9* -- --  NEUTROABS -- -- 8.2* 10.5*  HGB 8.2* 8.1* -- --  HCT 25.8* 24.9* -- --  MCV 88.7 88.3 -- --  PLT 233 201 -- --   Cardiac Enzymes:  Basename 10/09/12 1522 10/09/12 0939 10/09/12 0300 10/08/12 2051  CKTOTAL -- -- 29 37  CKMB -- -- 3.1 4.1*  CKMBINDEX -- -- -- --  TROPONINI 0.36* 0.70* 0.79* --   BNP    Component Value Date/Time   PROBNP 7675.0* 10/09/2012 0942    Radiology/Studies: CT angio Lungs 10/09/12 IMPRESSION:  Positive study for bilateral pulmonary emboli including central  right pulmonary embolus. Small bilateral pleural effusions with  airspace disease in both lung bases.  Critical Value/emergent results were called by telephone at the  time of interpretation on 10/09/2012 at 2219 hours to Debbie, the  patient's nurse, who verbally acknowledged these results.  Original Report Authenticated By: Burman Nieves, M.D.  VASCULAR STUDY: positive for right calf DVT  ECHOCARDIOGRAM:  10/09/12 ------------------------------------------------------------ LV EF: 60% - 65%  ------------------------------------------------------------ Indications: MI - acute 410.91.  ------------------------------------------------------------ History: Risk factors: Colon cancer.  ------------------------------------------------------------ Study Conclusions  - Left ventricle: The cavity size was normal. Wall thickness was normal. Systolic function was normal. The estimated ejection fraction was in the range of 60% to 65%. Wall motion was normal; there were no regional wall motion abnormalities. - Ventricular septum: The contour showed moderate diastolic flattening. - Mitral valve: Calcified annulus. Mild prolapse, involving the posterior leaflet. - Right ventricle: The cavity size was mildly dilated. Wall thickness was normal. Systolic function was mildly to moderately reduced. - Right atrium: The atrium was mildly to moderately dilated. - Pulmonary arteries: PA peak pressure: 38mm Hg (S).  Physical Exam: Blood pressure 137/76, pulse 96, temperature 97.3 F (36.3 C), temperature source Oral, resp. rate 20, height 5\' 9"  (1.753 m), weight 69.31 kg (152 lb 12.8 oz), SpO2 93.00%. Weight change: 2.21 kg (4 lb 13.9 oz)   Persistent split S2 c/w pulmonary hypertension  Clear lungs bilat anteriorly  Lethergy  ASSESSMENT:  1. Acute large bilateral pulmonary emboli, with moderate pulmonary hypertension and RV dilatation and dysfunction  Plan:  1. Anticoagulation 2. If recurrent PE, will need umbrella 3. High risk  for poor outcome. Family should be notified. 4. IV fluids to maintain BP 5. Lease call if we can help further.  Selinda Eon 10/10/2012, 8:24 AM

## 2012-10-10 NOTE — Progress Notes (Signed)
INITIAL NUTRITION ASSESSMENT  DOCUMENTATION CODES Per approved criteria  -Not Applicable   INTERVENTION: 1. Ensure Complete BID. Each supplement provides 340 kcal and 13 grams of protein.   NUTRITION DIAGNOSIS: Inadequate oral intake related to decreased appetite as evidenced by patient report.   Goal: Pt to meet >/= 90% of estimated needs.  Monitor:  Weight, PO intake   Reason for Assessment: Malnutrition Screening Tool (MST = 2)  79 y.o. female  Admitting Dx: Bilateral pulmonary embolism  ASSESSMENT: Pt is 79 yo female admitted with SOB, nausea, vomiting, anorexia. Pt came to ED yesterday morning weakness and  presyncopal episodes.  Her heart rate was noted to be elevated. Yesterday her chest x-ray was negative and she was sent back to her SNF.  Chest xray today shows HCAP. Pt has hx of colon cancer stage III status post colostomy. Recently, the output from her colostomy has increased and is probably diarrhea. Pt began to complain of chest pain and work up revealed acute large bilateral pulmonary emboli with moderate pulmonary hypertension and RV dilatation and dysfunction.   RD spoke with pt about recent weight loss and decreased appetite. Pt feels she has been steadily losing weight since April.  Pt states she is normally a good eater and in the past feels like she was eating too much.  Pt reports decreased appetite over the past couple weeks. Pt seems to be more worried about weight gain than weight loss. RD spoke with pt about the need for good nutrition after surgery and when sick.  Pt felt she was not getting enough to eat to have adequate nutrition. Pt is willing to drink nutrition supplements to meet her needs.   Height: Ht Readings from Last 1 Encounters:  10/08/12 5\' 9"  (1.753 m)    Weight: Wt Readings from Last 1 Encounters:  10/10/12 152 lb 12.8 oz (69.31 kg)    Ideal Body Weight: 145 lbs   % Ideal Body Weight: 104%  Wt Readings from Last 10 Encounters:   10/10/12 152 lb 12.8 oz (69.31 kg)  09/22/12 167 lb 1.7 oz (75.8 kg)  09/22/12 167 lb 1.7 oz (75.8 kg)  09/19/12 152 lb 4 oz (69.06 kg)  09/16/12 154 lb 14.4 oz (70.262 kg)  09/15/12 154 lb 6.4 oz (70.035 kg)  09/14/12 153 lb (69.4 kg)  09/09/12 152 lb 2 oz (69.003 kg)    Usual Body Weight: ~170 lbs   % Usual Body Weight: 89%  BMI:  Body mass index is 22.56 kg/(m^2). -WNL  Estimated Nutritional Needs: Kcal: 1700-1900 Protein: 80 - 95 Fluid: 1.7 - 1.9  Skin: incision to abdomen   Diet Order: Cardiac  EDUCATION NEEDS: -No education needs identified at this time   Intake/Output Summary (Last 24 hours) at 10/10/12 1342 Last data filed at 10/10/12 0755  Gross per 24 hour  Intake 801.25 ml  Output    725 ml  Net  76.25 ml    Last BM:  10/10/2012 (- 100 mL output from colostomy bag)  Labs:   Lab 10/10/12 0218 10/09/12 0300 10/08/12 1600  NA 132* 131* 135  K 4.2 3.8 4.0  CL 100 99 101  CO2 20 21 26   BUN 24* 24* 28*  CREATININE 0.90 1.02 1.23*  CALCIUM 8.6 8.1* 8.4  MG -- -- --  PHOS -- -- --  GLUCOSE 149* 140* 140*    CBG (last 3)  No results found for this basename: GLUCAP:3 in the last 72 hours  Scheduled Meds:   .  antiseptic oral rinse  15 mL Mouth Rinse q12n4p  . ceFEPime (MAXIPIME) IV  1 g Intravenous Q12H  . chlorhexidine  15 mL Mouth Rinse BID  . erythromycin  1 application Both Eyes QHS  . ferrous sulfate  325 mg Oral QPC breakfast  . lactulose  40 g Oral QHS  . levofloxacin (LEVAQUIN) IV  750 mg Intravenous Q48H  . metoprolol tartrate  25 mg Oral BID  . pantoprazole (PROTONIX) IV  40 mg Intravenous QHS  . potassium chloride SA  20 mEq Oral BID  . sertraline  75 mg Oral q morning - 10a  . sodium chloride  3 mL Intravenous Q12H  . sodium chloride  3 mL Intravenous Q12H  . vancomycin  1,000 mg Intravenous Q24H    Continuous Infusions:   . heparin      Past Medical History  Diagnosis Date  . DJD (degenerative joint disease)   .  Compression fracture     t12  . IBS (irritable bowel syndrome)   . Vaginal prolapse   . TMJ (temporomandibular joint disorder)   . Anemia   . Hearing loss   . Blood in stool   . Anxiety   . Cancer     Colon    Past Surgical History  Procedure Date  . Appendectomy   . Vaginal prolapse repair 2005 - approximate  . Cystectomy     back  . Nasal reconstruction with septal repair   . Skin cancer excision   . Arthroscopy left knee 2007  . Colonoscopy   . Total abdominal hysterectomy 1978  . Hernia repair 1980 - approximate  . Eye surgery [phone]    cataracts bilateral  . Back surgery     mid back  . Laparoscopic sigmoid colectomy 09/20/2012    Procedure: LAPAROSCOPIC SIGMOID COLECTOMY;  Surgeon: Emelia Loron, MD;  Location: [facility];  Service: General;  Laterality: N/A;  Laparoscopic possible Open Sigmoid Colectomy,Colostomy  . Colostomy 09/20/2012    Procedure: COLOSTOMY;  Surgeon: Emelia Loron, MD;  Location: [facility];  Service: General;  Laterality: N/A;    Belenda Cruise  Dietetic Intern Pager: [phone]

## 2012-10-10 NOTE — Progress Notes (Signed)
Patient ID: Erin Hansen, female   DOB: 08/03/1926, 79 y.o.   MRN: [id number] [facility] Progress Note:   * No surgery found *  Subjective: Mental status is not assessed. Objective: Vital signs in last 24 hours: Temp:  [97.3 F (36.3 C)-98 F (36.7 C)] 97.3 F (36.3 C) (02/03 0624) Pulse Rate:  [89-96] 96  (02/03 0624) Resp:  [19-20] 20  (02/03 0624) BP: (107-137)/(67-76) 137/76 mmHg (02/03 0624) SpO2:  [93 %-98 %] 93 % (02/03 0624) Weight:  [152 lb 12.8 oz (69.31 kg)] 152 lb 12.8 oz (69.31 kg) (02/03 0624)  Intake/Output from previous day: 02/02 0701 - 02/03 0700 In: 851.3 [P.O.:50; I.V.:501.3; IV Piggyback:300] Out: 750 [Urine:275; Stool:475] Intake/Output this shift: Total I/O In: -  Out: 100 [Stool:100]  Physical Exam:  Lab Results:  Results for orders placed during the hospital encounter of 10/08/12 (from the past 48 hour(s))  CBC WITH DIFFERENTIAL     Status: Abnormal   Collection Time   10/08/12  4:00 PM      Component Value Range Comment   WBC 11.0 (*) 4.0 - 10.5 K/uL    RBC 3.04 (*) 3.87 - 5.11 MIL/uL    Hemoglobin 8.7 (*) 12.0 - 15.0 g/dL    HCT 86.5 (*) 78.4 - 46.0 %    MCV 89.1  78.0 - 100.0 fL    MCH 28.6  26.0 - 34.0 pg    MCHC 32.1  30.0 - 36.0 g/dL    RDW 69.6  29.5 - 28.4 %    Platelets 238  150 - 400 K/uL    Neutrophils Relative 74  43 - 77 %    Neutro Abs 8.2 (*) 1.7 - 7.7 K/uL    Lymphocytes Relative 13  12 - 46 %    Lymphs Abs 1.4  0.7 - 4.0 K/uL    Monocytes Relative 12  3 - 12 %    Monocytes Absolute 1.4 (*) 0.1 - 1.0 K/uL    Eosinophils Relative 0  0 - 5 %    Eosinophils Absolute 0.0  0.0 - 0.7 K/uL    Basophils Relative 0  0 - 1 %    Basophils Absolute 0.0  0.0 - 0.1 K/uL   COMPREHENSIVE METABOLIC PANEL     Status: Abnormal   Collection Time   10/08/12  4:00 PM      Component Value Range Comment   Sodium 135  135 - 145 mEq/L    Potassium 4.0  3.5 - 5.1 mEq/L    Chloride 101  96 - 112 mEq/L    CO2 26  19 - 32 mEq/L     Glucose, Bld 140 (*) 70 - 99 mg/dL    BUN 28 (*) 6 - 23 mg/dL    Creatinine, Ser 1.32 (*) 0.50 - 1.10 mg/dL    Calcium 8.4  8.4 - 44.0 mg/dL    Total Protein 5.9 (*) 6.0 - 8.3 g/dL    Albumin 2.6 (*) 3.5 - 5.2 g/dL    AST 36  0 - 37 U/L    ALT 35  0 - 35 U/L    Alkaline Phosphatase 121 (*) 39 - 117 U/L    Total Bilirubin 0.4  0.3 - 1.2 mg/dL    GFR calc non Af Amer 39 (*) >90 mL/min    GFR calc Af Amer 45 (*) >90 mL/min   LIPASE, BLOOD     Status: Normal   Collection Time   10/08/12  4:00 PM      Component Value Range Comment   Lipase 16  11 - 59 U/L   VITAMIN B12     Status: Normal   Collection Time   10/08/12  4:00 PM      Component Value Range Comment   Vitamin B-12 311  211 - 911 pg/mL   FOLATE     Status: Normal   Collection Time   10/08/12  4:00 PM      Component Value Range Comment   Folate 12.2     IRON AND TIBC     Status: Abnormal   Collection Time   10/08/12  4:00 PM      Component Value Range Comment   Iron 17 (*) 42 - 135 ug/dL    TIBC 213  086 - 578 ug/dL    Saturation Ratios 7 (*) 20 - 55 %    UIBC 243  125 - 400 ug/dL   FERRITIN     Status: Normal   Collection Time   10/08/12  4:00 PM      Component Value Range Comment   Ferritin 225  10 - 291 ng/mL   LACTIC ACID, PLASMA     Status: Normal   Collection Time   10/08/12  4:46 PM      Component Value Range Comment   Lactic Acid, Venous 1.0  0.5 - 2.2 mmol/L   POCT I-STAT TROPONIN I     Status: Abnormal   Collection Time   10/08/12  4:48 PM      Component Value Range Comment   Troponin i, poc 0.51 (*) 0.00 - 0.08 ng/mL    Comment NOTIFIED PHYSICIAN      Comment 3            URINALYSIS, ROUTINE W REFLEX MICROSCOPIC     Status: Abnormal   Collection Time   10/08/12  6:00 PM      Component Value Range Comment   Color, Urine AMBER (*) YELLOW BIOCHEMICALS MAY BE AFFECTED BY COLOR   APPearance CLOUDY (*) CLEAR    Specific Gravity, Urine 1.025  1.005 - 1.030    pH 5.0  5.0 - 8.0    Glucose, UA NEGATIVE  NEGATIVE mg/dL     Hgb urine dipstick NEGATIVE  NEGATIVE    Bilirubin Urine SMALL (*) NEGATIVE    Ketones, ur TRACE (*) NEGATIVE mg/dL    Protein, ur 30 (*) NEGATIVE mg/dL    Urobilinogen, UA 0.2  0.0 - 1.0 mg/dL    Nitrite NEGATIVE  NEGATIVE    Leukocytes, UA MODERATE (*) NEGATIVE   URINE MICROSCOPIC-ADD ON     Status: Abnormal   Collection Time   10/08/12  6:00 PM      Component Value Range Comment   Squamous Epithelial / LPF FEW (*) RARE    WBC, UA 11-20  <3 WBC/hpf    Bacteria, UA FEW (*) RARE    Casts HYALINE CASTS (*) NEGATIVE   AMYLASE     Status: Normal   Collection Time   10/08/12  8:40 PM      Component Value Range Comment   Amylase 55  0 - 105 U/L   CK TOTAL AND CKMB     Status: Abnormal   Collection Time   10/08/12  8:51 PM      Component Value Range Comment   Total CK 37  7 - 177 U/L    CK, MB 4.1 (*) 0.3 - 4.0 ng/mL  Relative Index RELATIVE INDEX IS INVALID  0.0 - 2.5   CK TOTAL AND CKMB     Status: Normal   Collection Time   10/09/12  3:00 AM      Component Value Range Comment   Total CK 29  7 - 177 U/L    CK, MB 3.1  0.3 - 4.0 ng/mL    Relative Index RELATIVE INDEX IS INVALID  0.0 - 2.5   BASIC METABOLIC PANEL     Status: Abnormal   Collection Time   10/09/12  3:00 AM      Component Value Range Comment   Sodium 131 (*) 135 - 145 mEq/L    Potassium 3.8  3.5 - 5.1 mEq/L    Chloride 99  96 - 112 mEq/L    CO2 21  19 - 32 mEq/L    Glucose, Bld 140 (*) 70 - 99 mg/dL    BUN 24 (*) 6 - 23 mg/dL    Creatinine, Ser 1.61  0.50 - 1.10 mg/dL    Calcium 8.1 (*) 8.4 - 10.5 mg/dL    GFR calc non Af Amer 49 (*) >90 mL/min    GFR calc Af Amer 56 (*) >90 mL/min   CBC     Status: Abnormal   Collection Time   10/09/12  3:00 AM      Component Value Range Comment   WBC 11.9 (*) 4.0 - 10.5 K/uL    RBC 2.82 (*) 3.87 - 5.11 MIL/uL    Hemoglobin 8.1 (*) 12.0 - 15.0 g/dL    HCT 09.6 (*) 04.5 - 46.0 %    MCV 88.3  78.0 - 100.0 fL    MCH 28.7  26.0 - 34.0 pg    MCHC 32.5  30.0 - 36.0 g/dL    RDW  40.9  81.1 - 91.4 %    Platelets 201  150 - 400 K/uL   TROPONIN I     Status: Abnormal   Collection Time   10/09/12  3:00 AM      Component Value Range Comment   Troponin I 0.79 (*) <0.30 ng/mL   MRSA PCR SCREENING     Status: Normal   Collection Time   10/09/12  4:52 AM      Component Value Range Comment   MRSA by PCR NEGATIVE  NEGATIVE   LEGIONELLA ANTIGEN, URINE     Status: Normal   Collection Time   10/09/12  7:53 AM      Component Value Range Comment   Specimen Description URINE, RANDOM      Special Requests NONE      Legionella Antigen, Urine Negative for Legionella pneumophilia serogroup 1      Report Status 10/09/2012 FINAL     STREP PNEUMONIAE URINARY ANTIGEN     Status: Normal   Collection Time   10/09/12  7:54 AM      Component Value Range Comment   Strep Pneumo Urinary Antigen NEGATIVE  NEGATIVE   TROPONIN I     Status: Abnormal   Collection Time   10/09/12  9:39 AM      Component Value Range Comment   Troponin I 0.70 (*) <0.30 ng/mL   D-DIMER, QUANTITATIVE     Status: Abnormal   Collection Time   10/09/12  9:40 AM      Component Value Range Comment   D-Dimer, Quant 3.21 (*) 0.00 - 0.48 ug/mL-FEU   PRO B NATRIURETIC PEPTIDE     Status: Abnormal  Collection Time   10/09/12  9:42 AM      Component Value Range Comment   Pro B Natriuretic peptide (BNP) 7675.0 (*) 0 - 450 pg/mL   OSMOLALITY     Status: Normal   Collection Time   10/09/12  9:50 AM      Component Value Range Comment   Osmolality 290  275 - 300 mOsm/kg   TYPE AND SCREEN     Status: Normal   Collection Time   10/09/12 11:40 AM      Component Value Range Comment   ABO/RH(D) A POS      Antibody Screen NEG      Sample Expiration 10/12/2012     RETICULOCYTES     Status: Abnormal   Collection Time   10/09/12 11:40 AM      Component Value Range Comment   Retic Ct Pct 2.7  0.4 - 3.1 %    RBC. 2.88 (*) 3.87 - 5.11 MIL/uL    Retic Count, Manual 77.8  19.0 - 186.0 K/uL   SODIUM, URINE, RANDOM     Status: Normal    Collection Time   10/09/12  1:51 PM      Component Value Range Comment   Sodium, Ur 28     OSMOLALITY, URINE     Status: Normal   Collection Time   10/09/12  1:51 PM      Component Value Range Comment   Osmolality, Ur 700  390 - 1090 mOsm/kg   TROPONIN I     Status: Abnormal   Collection Time   10/09/12  3:22 PM      Component Value Range Comment   Troponin I 0.36 (*) <0.30 ng/mL   PROTIME-INR     Status: Abnormal   Collection Time   10/09/12  6:33 PM      Component Value Range Comment   Prothrombin Time 15.7 (*) 11.6 - 15.2 seconds    INR 1.28  0.00 - 1.49   APTT     Status: Normal   Collection Time   10/09/12  6:33 PM      Component Value Range Comment   aPTT 36  24 - 37 seconds   CBC     Status: Abnormal   Collection Time   10/10/12  2:18 AM      Component Value Range Comment   WBC 12.8 (*) 4.0 - 10.5 K/uL    RBC 2.91 (*) 3.87 - 5.11 MIL/uL    Hemoglobin 8.2 (*) 12.0 - 15.0 g/dL    HCT 16.1 (*) 09.6 - 46.0 %    MCV 88.7  78.0 - 100.0 fL    MCH 28.2  26.0 - 34.0 pg    MCHC 31.8  30.0 - 36.0 g/dL    RDW 04.5  40.9 - 81.1 %    Platelets 233  150 - 400 K/uL   BASIC METABOLIC PANEL     Status: Abnormal   Collection Time   10/10/12  2:18 AM      Component Value Range Comment   Sodium 132 (*) 135 - 145 mEq/L    Potassium 4.2  3.5 - 5.1 mEq/L    Chloride 100  96 - 112 mEq/L    CO2 20  19 - 32 mEq/L    Glucose, Bld 149 (*) 70 - 99 mg/dL    BUN 24 (*) 6 - 23 mg/dL    Creatinine, Ser 9.14  0.50 - 1.10 mg/dL    Calcium 8.6  8.4 - 10.5 mg/dL    GFR calc non Af Amer 57 (*) >90 mL/min    GFR calc Af Amer 66 (*) >90 mL/min   HEPARIN LEVEL (UNFRACTIONATED)     Status: Normal   Collection Time   10/10/12  2:18 AM      Component Value Range Comment   Heparin Unfractionated 0.31  0.30 - 0.70 IU/mL     Radiology/Results: Ct Angio Chest Pe W/cm &/or Wo Cm  10/09/2012  *RADIOLOGY REPORT*  Clinical Data: Shortness of breath, nausea and vomiting.  Right- sided chest pain.  CT ANGIOGRAPHY CHEST   Technique:  Multidetector CT imaging of the chest using the standard protocol during bolus administration of intravenous contrast. Multiplanar reconstructed images including MIPs were obtained and reviewed to evaluate the vascular anatomy.  Contrast: OMNIPAQUE IOHEXOL 350 MG/ML SOLN  Comparison: 08/26/2012  Findings: Technically adequate study with good opacification of the central and segmental pulmonary arteries.  Multiple filling defects are demonstrated in the right main pulmonary artery and multiple upper and lower lobe segmental and subsegmental branches bilaterally.  The examination is positive for pulmonary embolus. Mild right heart dilatation may represent some degree of straining.  Cardiac enlargement.  Normal caliber thoracic aorta with calcification.  No significant lymphadenopathy in the chest.  The esophagus is decompressed.  Visualized portions of the upper abdominal organs demonstrate calcified granulomas in the spleen and a subcapsular hematoma in the right liver as previously discussed on CT abdomen and pelvis from 10/08/2012.  Moderate sized esophageal hiatal hernia.  Small bilateral pleural effusions greater on the right side.  There is airspace infiltration in both lung bases suggesting pneumonia.  Mild diffuse emphysematous changes bilaterally.  Bronchial wall thickening consistent with chronic bronchitis.  Airways appear patent.  No pneumothorax. Normal alignment of the thoracic vertebrae.  No compression deformities demonstrated.  Note that the T12 vertebra seen to decompressed on prior images is not included on this study.  No destructive bone lesions appreciated.  IMPRESSION: Positive study for bilateral pulmonary emboli including central right pulmonary embolus.  Small bilateral pleural effusions with airspace disease in both lung bases.  Critical Value/emergent results were called by telephone at the time of interpretation on 10/09/2012 at 2219 hours to Debbie, the patient's nurse,  who verbally acknowledged these results.   Original Report Authenticated By: Burman Nieves, M.D.    Mr Brain Wo Contrast  10/09/2012  *RADIOLOGY REPORT*  Clinical Data: Presyncopal episodes.  Mild dysarthria and confusion.  The examination had to be discontinued prior to completion due to pain.  MRI HEAD WITHOUT CONTRAST  Technique:  Multiplanar, multiecho pulse sequences of the brain and surrounding structures were obtained according to standard protocol without intravenous contrast.  Comparison: None.  Findings: No acute infarct, hemorrhage, or mass lesion is present. Moderate generalized atrophy is present.  Periventricular and scattered subcortical T2 hyperintensities are advanced for age as well.  The flow is present in the major intracranial arteries.  The globes and orbits are intact.  The paranasal sinuses are clear. There is some fluid in the left mastoid air cells.  No obstructing nasopharyngeal lesion is evident.  IMPRESSION:  1.  No acute intracranial abnormality. 2.  Moderate atrophy white matter disease.  This likely reflects the sequelae of chronic microvascular ischemia.   Original Report Authenticated By: Marin Roberts, M.D.    US Abdomen Complete  10/09/2012  *RADIOLOGY REPORT*  Clinical Data:  Right upper quadrant pain  COMPLETE ABDOMINAL ULTRASOUND  Comparison:  CT  abdomen pelvis dated 10/08/2012.  Biopsy ultrasound dated 09/14/2012.  Findings:  Gallbladder:  Gallbladder sludge.  No gallstones, gallbladder wall thickening, or pericholecystic fluid.  Negative sonographic Murphy's sign.  Common bile duct:  Measures 4 mm.  Liver:  7.8 x 3.7 x 6.7 cm complex subcapsular fluid collection/hematoma overlying the right hepatic lobe.  1.6 x 1.7 x 1.7 cm hyperechoic lesion in the central right hepatic lobe, unchanged from prior biopsy ultrasound.  IVC:  Appears normal.  Pancreas:  Incompletely visualized but grossly unremarkable.  Spleen:  Measures 7.4 cm.  Calcified splenic granulomata.  Right  Kidney:  Measures 11.2 cm.  No mass or hydronephrosis.  Left Kidney:  Measures 10.6 cm.  No mass or hydronephrosis.  Abdominal aorta:  No aneurysm identified.  IMPRESSION: 7.8 cm complex subcapsular fluid collection/hematoma overlying the right hepatic lobe.  1.7 cm hyperechoic lesion in the central right hepatic lobe, unchanged from prior biopsy ultrasound.   Original Report Authenticated By: Charline Bills, M.D.    Ct Abdomen Pelvis W Contrast  10/08/2012  *RADIOLOGY REPORT*  Clinical Data: Right-sided abdominal pain.  Post colostomy 01/14.  CT ABDOMEN AND PELVIS WITH CONTRAST  Technique:  Multidetector CT imaging of the abdomen and pelvis was performed following the standard protocol during bolus administration of intravenous contrast.  Contrast: OMNIPAQUE IOHEXOL 300 MG/ML  SOLN  Comparison: 08/26/2012  Findings: Airspace infiltration in both lung bases, greater on the right, suggesting pneumonia.  Minimal bilateral pleural effusions. Cardiac enlargement.  Moderate sized esophageal hiatal hernia.  Since the previous study, there is interval development of a subcapsular hematoma in the right lobe of the liver.  This measures about 3.5 x 8.4 cm.  This is adjacent to the previously demonstrated focal liver lesion.  Perhaps there has been interval biopsy of this lesion leading to the hematoma.  If there has been no intervention, then hemorrhage arising from the lesion could have this appearance.  Calcified granulomas in the spleen.  The gallbladder, pancreas, adrenal glands, kidneys, and retroperitoneal lymph nodes are unremarkable.  Calcification of the abdominal aorta without aneurysm.  There have been interval postoperative changes with placement of a left upper quadrant ostomy which appears to connect to the descending colon.  The stomach, small bowel, and colon are not abnormally distended.  No free air or free fluid in the abdomen.  Pelvis:  The bladder wall is not thickened.  The uterus appears to  be surgically absent.  The rectosigmoid colon is decompressed and the previously identified mass appears to been resected.  No free or loculated pelvic fluid collections.  No significant pelvic lymphadenopathy.  The appendix is not identified.  Scoliosis and degenerative changes in the lumbar spine.  Degenerative changes in the hips.  Compression and kyphoplasty changes at T12. Spondylolysis and mild spondylolisthesis at L5-S1.  IMPRESSION: Interval development of a right hepatic subcapsular hematoma which could arisen from biopsy of the right hepatic lesion or from rupture of this lesion.  Correlation with surgical history is recommended.  Interval postoperative changes with resection of a rectosigmoid mass lesion and placement of the descending colostomy. Infiltrates in both lung bases suggesting pneumonia.   Original Report Authenticated By: Burman Nieves, M.D.    Dg Abd Acute W/chest  10/08/2012  *RADIOLOGY REPORT*  Clinical Data: Abdominal pain, nausea/vomiting/diarrhea  ACUTE ABDOMEN SERIES (ABDOMEN 2 VIEW & CHEST 1 VIEW)  Comparison: 10/07/2012  Findings: Mild patchy left lower lobe opacity, atelectasis versus pneumonia.  Suspected right basilar atelectasis. No pleural effusion or  pneumothorax.  The heart is top normal in size.  Nonobstructive bowel gas pattern.  Left lower quadrant ostomy.  No evidence of free air on the lateral decubitus view.  Degenerative changes of the visualized thoracolumbar spine.  IMPRESSION: Mild patchy left lower lobe opacity, atelectasis versus pneumonia.  No evidence of small bowel obstruction or free air.   Original Report Authenticated By: Charline Bills, M.D.     Anti-infectives: Anti-infectives     Start     Dose/Rate Route Frequency Ordered Stop   10/10/12 1800   levofloxacin (LEVAQUIN) IVPB 750 mg        750 mg 100 mL/hr over 90 Minutes Intravenous Every 48 hours 10/08/12 1850 10/12/12 1759   10/09/12 1000   vancomycin (VANCOCIN) IVPB 1000 mg/200 mL premix         1,000 mg 200 mL/hr over 60 Minutes Intravenous Every 24 hours 10/08/12 1850 10/17/12 0959   10/08/12 2345   ceFEPIme (MAXIPIME) 1 g in dextrose 5 % 50 mL IVPB  Status:  Discontinued        1 g 100 mL/hr over 30 Minutes Intravenous 3 times per day 10/08/12 2342 10/08/12 2351   10/08/12 2345   levofloxacin (LEVAQUIN) IVPB 750 mg  Status:  Discontinued        750 mg 100 mL/hr over 90 Minutes Intravenous Every 24 hours 10/08/12 2342 10/08/12 2353   10/08/12 2000   ceFEPIme (MAXIPIME) 1 g in dextrose 5 % 50 mL IVPB  Status:  Discontinued        1 g 100 mL/hr over 30 Minutes Intravenous Every 8 hours 10/08/12 1820 10/08/12 1850   10/08/12 2000   ceFEPIme (MAXIPIME) 1 g in dextrose 5 % 50 mL IVPB        1 g 100 mL/hr over 30 Minutes Intravenous Every 12 hours 10/08/12 1850 10/16/12 2159   10/08/12 2000   vancomycin (VANCOCIN) IVPB 1000 mg/200 mL premix        1,000 mg 200 mL/hr over 60 Minutes Intravenous  Once 10/08/12 1850 10/08/12 2233   10/08/12 1830   levofloxacin (LEVAQUIN) IVPB 750 mg  Status:  Discontinued        750 mg 100 mL/hr over 90 Minutes Intravenous Every 24 hours 10/08/12 1820 10/08/12 1850          Assessment/Plan: Problem List: Patient Active Problem List  Diagnosis  . Colon cancer  . Healthcare-associated pneumonia  . Non-ST elevation MI (NSTEMI)  . Abnormal EKG  . Liver hematoma  . Pleuritic chest pain  . Bilateral pulmonary embolism    Discussed with Dr. Katrinka Blazing.  Heparin instituted for PE.  Will follow * No surgery found *    LOS: 2 days   Matt B. Daphine Deutscher, MD, [facility] [phone] beeper [phone]  10/10/2012 8:47 AM

## 2012-10-10 NOTE — Telephone Encounter (Signed)
LMOM for pt to call me b/c we were checking on her since she had to go to the ER over the weekend.

## 2012-10-10 NOTE — Progress Notes (Signed)
ANTICOAGULATION CONSULT NOTE -  Pharmacy Consult for Heparin Indication: pulmonary embolus  Allergies  Allergen Reactions  . Codeine Nausea Only   Patient Measurements: Height: 5\' 9"  (175.3 cm) Weight: 152 lb 12.8 oz (69.31 kg) IBW/kg (Calculated) : 66.2  Heparin dosing weight: 69.3kg   Vital Signs: Temp: 98.1 F (36.7 C) (02/03 2021) Temp src: Oral (02/03 2021) BP: 117/48 mmHg (02/03 2021) Pulse Rate: 86  (02/03 2021)  Labs:  Basename 10/10/12 2007 10/10/12 1105 10/10/12 0218 10/09/12 1833 10/09/12 1522 10/09/12 0939 10/09/12 0300 10/08/12 2051 10/08/12 1600  HGB -- -- 8.2* -- -- -- 8.1* -- --  HCT -- -- 25.8* -- -- -- 24.9* -- 27.1*  PLT -- -- 233 -- -- -- 201 -- 238  APTT -- -- -- 36 -- -- -- -- --  LABPROT -- -- -- 15.7* -- -- -- -- --  INR -- -- -- 1.28 -- -- -- -- --  HEPARINUNFRC 0.14* 0.26* 0.31 -- -- -- -- -- --  CREATININE -- -- 0.90 -- -- -- 1.02 -- 1.23*  CKTOTAL -- -- -- -- -- -- 29 37 --  CKMB -- -- -- -- -- -- 3.1 4.1* --  TROPONINI -- -- -- -- 0.36* 0.70* 0.79* -- --    Estimated Creatinine Clearance: 47.8 ml/min (by C-G formula based on Cr of 0.9).   Medical History: Past Medical History  Diagnosis Date  . DJD (degenerative joint disease)   . Compression fracture     t12  . IBS (irritable bowel syndrome)   . Vaginal prolapse   . TMJ (temporomandibular joint disorder)   . Anemia   . Hearing loss   . Blood in stool   . Anxiety   . Cancer     Colon    Medications:  Prescriptions prior to admission  Medication Sig Dispense Refill  . acetaminophen (TYLENOL) 500 MG tablet Take 1,000 mg by mouth every 4 (four) hours as needed. For pain      . docusate sodium (COLACE) 100 MG capsule Take 400 mg by mouth at bedtime as needed. For constipation      . erythromycin ophthalmic ointment Place 1 application into both eyes at bedtime.      . ferrous sulfate 325 (65 FE) MG tablet Take 325 mg by mouth daily with breakfast.       . lactulose (CHRONULAC)  10 GM/15ML solution Take 40 g by mouth at bedtime.      . Melatonin 5 MG TABS Take 5 mg by mouth at bedtime as needed. For sleep      . ondansetron (ZOFRAN) 4 MG tablet Take 1 tablet (4 mg total) by mouth every 6 (six) hours.  20 tablet  0  . oxyCODONE-acetaminophen (PERCOCET/ROXICET) 5-325 MG per tablet Take 1-2 tablets by mouth every 4 (four) hours as needed. Moderate to severe pain.      . potassium chloride SA (K-DUR,KLOR-CON) 20 MEQ tablet Take 20 mEq by mouth 2 (two) times daily.      . psyllium (METAMUCIL) 58.6 % powder Take 1 packet by mouth at bedtime. Takes following lactulose.      . sertraline (ZOLOFT) 50 MG tablet Take 75 mg by mouth every morning.      . cholecalciferol (VITAMIN D) 1000 UNITS tablet Take 1,000 Units by mouth every morning.      . fish oil-omega-3 fatty acids 1000 MG capsule Take 1 g by mouth daily.      . hydrochlorothiazide (HYDRODIURIL) 25  MG tablet Take 25 mg by mouth every morning.        Scheduled:     . antiseptic oral rinse  15 mL Mouth Rinse q12n4p  . ceFEPime (MAXIPIME) IV  1 g Intravenous Q12H  . chlorhexidine  15 mL Mouth Rinse BID  . erythromycin  1 application Both Eyes QHS  . feeding supplement  237 mL Oral BID BM  . ferrous sulfate  325 mg Oral QPC breakfast  . lactulose  40 g Oral QHS  . levofloxacin (LEVAQUIN) IV  750 mg Intravenous Q48H  . metoprolol tartrate  25 mg Oral BID  . pantoprazole (PROTONIX) IV  40 mg Intravenous QHS  . potassium chloride SA  20 mEq Oral BID  . sertraline  75 mg Oral q morning - 10a  . sodium chloride  3 mL Intravenous Q12H  . sodium chloride  3 mL Intravenous Q12H  . vancomycin  1,000 mg Intravenous Q24H  . [DISCONTINUED] albuterol  2.5 mg Nebulization Q6H  . [DISCONTINUED] aspirin EC  81 mg Oral Daily   Infusions:     . [EXPIRED] sodium chloride 75 mL/hr (10/09/12 1219)  . heparin 1,150 Units/hr (10/10/12 1926)  . [DISCONTINUED] heparin 1,050 Units/hr (10/09/12 1844)   PRN: sodium chloride,  acetaminophen, albuterol, HYDROcodone-acetaminophen, [COMPLETED] iohexol, morphine injection, ondansetron (ZOFRAN) IV, sodium chloride, zolpidem  Assessment: 87 YOF w/DVT/PE, DVT confirmed, CT angio confirms + Pulmonary Embolism. Pharmacy asked to dose Heparin, no bolus and aim for low end tx range d/t CT showing subcapsular hematoma in R lobe of liver  IV heparin currently running at 1150 units/hr, heparin level returns low at 0.14.  Patient was previously therapeutic on 1050 units/hr.    Pt to start on Coumadin bridge 2/4 with low INR goal 2 - 2.5  Hgb low, stable from yesterday.  Plts ok.  No bleed other than liver hematoma noted.  No infusion issues noted per RN.      Goal of Therapy:  Heparin level 0.3-0.7 units/ml --aim for low end tx range Monitor platelets by anticoagulation protocol: Yes   Plan:   Increase Heparin rate to 1250 units/hour (=12.5 ml/hr)  Repeat HL 8hr   Daily CBC & Heparin level  Geoffry Paradise, PharmD, BCPS Pager: [phone] 8:57 PM Pharmacy #: [id number]

## 2012-10-10 NOTE — Progress Notes (Signed)
Pt's Cdiff PCR is Neg. Call to Infection Prevention nurse Eber Jones. However, no isolation order is seen in chart. Will dc isolation as pt is not have stools, nor has she per her dgt who is at bedside. Pt does have colostomy intact.

## 2012-10-10 NOTE — Progress Notes (Signed)
ANTICOAGULATION CONSULT NOTE -  Pharmacy Consult for Heparin Indication: pulmonary embolus  Allergies  Allergen Reactions  . Codeine Nausea Only    Patient Measurements: Height: 5\' 9"  (175.3 cm) Weight: 152 lb 12.8 oz (69.31 kg) IBW/kg (Calculated) : 66.2  Heparin dosing weight: 69.3kg    Vital Signs: Temp: 98 F (36.7 C) (02/03 1047) Temp src: Oral (02/03 1047) BP: 134/71 mmHg (02/03 1047) Pulse Rate: 96  (02/03 1047)  Labs:  Basename 10/10/12 1105 10/10/12 0218 10/09/12 1833 10/09/12 1522 10/09/12 0939 10/09/12 0300 10/08/12 2051 10/08/12 1600  HGB -- 8.2* -- -- -- 8.1* -- --  HCT -- 25.8* -- -- -- 24.9* -- 27.1*  PLT -- 233 -- -- -- 201 -- 238  APTT -- -- 36 -- -- -- -- --  LABPROT -- -- 15.7* -- -- -- -- --  INR -- -- 1.28 -- -- -- -- --  HEPARINUNFRC 0.26* 0.31 -- -- -- -- -- --  CREATININE -- 0.90 -- -- -- 1.02 -- 1.23*  CKTOTAL -- -- -- -- -- 29 37 --  CKMB -- -- -- -- -- 3.1 4.1* --  TROPONINI -- -- -- 0.36* 0.70* 0.79* -- --    Estimated Creatinine Clearance: 47.8 ml/min (by C-G formula based on Cr of 0.9).   Medical History: Past Medical History  Diagnosis Date  . DJD (degenerative joint disease)   . Compression fracture     t12  . IBS (irritable bowel syndrome)   . Vaginal prolapse   . TMJ (temporomandibular joint disorder)   . Anemia   . Hearing loss   . Blood in stool   . Anxiety   . Cancer     Colon    Medications:  Prescriptions prior to admission  Medication Sig Dispense Refill  . acetaminophen (TYLENOL) 500 MG tablet Take 1,000 mg by mouth every 4 (four) hours as needed. For pain      . docusate sodium (COLACE) 100 MG capsule Take 400 mg by mouth at bedtime as needed. For constipation      . erythromycin ophthalmic ointment Place 1 application into both eyes at bedtime.      . ferrous sulfate 325 (65 FE) MG tablet Take 325 mg by mouth daily with breakfast.       . lactulose (CHRONULAC) 10 GM/15ML solution Take 40 g by mouth at  bedtime.      . Melatonin 5 MG TABS Take 5 mg by mouth at bedtime as needed. For sleep      . ondansetron (ZOFRAN) 4 MG tablet Take 1 tablet (4 mg total) by mouth every 6 (six) hours.  20 tablet  0  . oxyCODONE-acetaminophen (PERCOCET/ROXICET) 5-325 MG per tablet Take 1-2 tablets by mouth every 4 (four) hours as needed. Moderate to severe pain.      . potassium chloride SA (K-DUR,KLOR-CON) 20 MEQ tablet Take 20 mEq by mouth 2 (two) times daily.      . psyllium (METAMUCIL) 58.6 % powder Take 1 packet by mouth at bedtime. Takes following lactulose.      . sertraline (ZOLOFT) 50 MG tablet Take 75 mg by mouth every morning.      . cholecalciferol (VITAMIN D) 1000 UNITS tablet Take 1,000 Units by mouth every morning.      . fish oil-omega-3 fatty acids 1000 MG capsule Take 1 g by mouth daily.      . hydrochlorothiazide (HYDRODIURIL) 25 MG tablet Take 25 mg by mouth every morning.  Scheduled:     . antiseptic oral rinse  15 mL Mouth Rinse q12n4p  . ceFEPime (MAXIPIME) IV  1 g Intravenous Q12H  . chlorhexidine  15 mL Mouth Rinse BID  . erythromycin  1 application Both Eyes QHS  . ferrous sulfate  325 mg Oral QPC breakfast  . lactulose  40 g Oral QHS  . levofloxacin (LEVAQUIN) IV  750 mg Intravenous Q48H  . metoprolol tartrate  25 mg Oral BID  . pantoprazole (PROTONIX) IV  40 mg Intravenous QHS  . potassium chloride SA  20 mEq Oral BID  . sertraline  75 mg Oral q morning - 10a  . sodium chloride  3 mL Intravenous Q12H  . sodium chloride  3 mL Intravenous Q12H  . vancomycin  1,000 mg Intravenous Q24H  . [DISCONTINUED] albuterol  2.5 mg Nebulization Q6H  . [DISCONTINUED] aspirin EC  81 mg Oral Daily   Infusions:     . [EXPIRED] sodium chloride 75 mL/hr (10/09/12 1219)  . heparin 1,050 Units/hr (10/09/12 1844)   PRN: sodium chloride, acetaminophen, albuterol, HYDROcodone-acetaminophen, [COMPLETED] iohexol, morphine injection, ondansetron (ZOFRAN) IV, sodium chloride,  zolpidem  Assessment: 87 YOF w/DVT/PE, DVT confirmed, CT angio confirms + Pulmonary Embolism. Pharmacy asked to dose Heparin, no bolus and aim for low end tx range d/t CT showing subcapsular hematoma in R lobe of liver  1st HL this morning was therapeutic, however 2nd HL at same rate has now decreased and is subtherapeutic @ 0.26.    Pt to start on Coumadin bridge 2/4 with low INR goal 2 - 2.5  Hgb low, stable from yesterday.  Plts ok.  No bleed other than liver hematoma noted.  No infusion issues noted per RN.      Goal of Therapy:  Heparin level 0.3-0.7 units/ml --aim for low end tx range Monitor platelets by anticoagulation protocol: Yes   Plan:  Increase Heparin rate to 1150 units/hour (=11.5 ml/hr) Repeat HL 8 hr to confirm therapeutic dose  Daily CBC & Heparin level  Haynes Hoehn, PharmD 10/10/2012 11:46 AM  Pager: [phone]

## 2012-10-10 NOTE — Progress Notes (Signed)
Agree with above.  Mouna Yager, MS RD LDN Clinical Inpatient Dietitian Pager: [phone] Weekend/After hours pager: [phone]  

## 2012-10-10 NOTE — Progress Notes (Signed)
[facility]                                                                                Patient Demographics  Erin Hansen, is a 79 y.o. female  ZOX:[id number]  WJX:[id number]  DOB - 03-27-1926  Admit date - 10/08/2012  Admitting Physician Carron Curie, MD  Outpatient Primary MD for the patient is Ginette Otto, MD  LOS - 2   Chief Complaint  Patient presents with  . N/V/D         Assessment & Plan    1. SOB due to Acute PE-DVT (R.Leg) and HCAP - clinically feels much better, white count has improved, patient is now feeling much better and is pain-free, after discussions with general surgery in the light of her liver hematoma due to a recent biopsy few weeks ago, he should be placed on heparin drip with caution and without bolus, plan is to continue heparin drip for 48 hours, liver hematoma remains stable clinically she will placed on Coumadin overlap with the goal of keeping INR 2-2.5 at low therapeutic range.    2.NSTEMI -  and remains chest pain-free, likely troponin rise is due to right ventricular strain from acute PE which are large, Appreciate cardiology input, as received low-dose aspirin for 2 days we'll now discontinue as she is on heparin drip and there is small liver hematoma, low-dose Lopressor will be added. Troponin rise so far does not appear to be in ACS pattern.     3. Possible healthcare associated pneumonia. For now continue antibiotics as #1 above, CT angios above, will taper down antibiotics rapidly if continues to improve. Follow cultures.     4. Recent history of colon cancer, status post colon resection with colostomy placement, general surgery to follow, incidental finding of small liver hematoma-again surgery has been consulted,  and discussed with general surgeon Dr. Darylene Price in detail on 10-2012, hematoma likely from liver biopsy which was done prior to her colon resection, okay for anticoagulation with cautious  monitoring..     5. UTI. Follow cultures, antibiotics for HCAPshould suffice.     6. Mild hyponatremia. Will Ur Osm >>Sr Osm , fluid restritction and monitor.     7. Normocytic Normochromic Anemia. Likely anemia of chronic disease, stable anemia panel.     Code Status: Full  Family Communication: With the patient, son and daughter-in-law in detail bedside.  Disposition Plan: SNF   Procedures echo gram, MRI head, CT angio chest, CT abdomen pelvis, Leg Venous US   Consults  Cards   DVT Prophylaxis  SCDs (once [place] -ve)  Lab Results  Component Value Date   PLT 233 10/10/2012    Medications  Scheduled Meds:    . antiseptic oral rinse  15 mL Mouth Rinse q12n4p  . aspirin EC  81 mg Oral Daily  . ceFEPime (MAXIPIME) IV  1 g Intravenous Q12H  . chlorhexidine  15 mL Mouth Rinse BID  . erythromycin  1 application Both Eyes QHS  . ferrous sulfate  325 mg Oral QPC breakfast  . lactulose  40 g Oral QHS  . levofloxacin (LEVAQUIN) IV  750 mg Intravenous Q48H  . metoprolol tartrate  25 mg Oral BID  . pantoprazole (PROTONIX) IV  40 mg Intravenous QHS  . potassium chloride SA  20 mEq Oral BID  . sertraline  75 mg Oral q morning - 10a  . sodium chloride  3 mL Intravenous Q12H  . sodium chloride  3 mL Intravenous Q12H  . vancomycin  1,000 mg Intravenous Q24H   Continuous Infusions:    . heparin 1,050 Units/hr (10/09/12 1844)   PRN Meds:.sodium chloride, acetaminophen, albuterol, HYDROcodone-acetaminophen, morphine injection, ondansetron (ZOFRAN) IV, sodium chloride, zolpidem  Antibiotics    Anti-infectives     Start     Dose/Rate Route Frequency Ordered Stop   10/10/12 1800   levofloxacin (LEVAQUIN) IVPB 750 mg        750 mg 100 mL/hr over 90 Minutes Intravenous Every 48 hours 10/08/12 1850 10/12/12 1759   10/09/12 1000   vancomycin (VANCOCIN) IVPB 1000 mg/200 mL premix        1,000 mg 200 mL/hr over 60 Minutes Intravenous Every 24 hours 10/08/12 1850 10/17/12  0959   10/08/12 2345   ceFEPIme (MAXIPIME) 1 g in dextrose 5 % 50 mL IVPB  Status:  Discontinued        1 g 100 mL/hr over 30 Minutes Intravenous 3 times per day 10/08/12 2342 10/08/12 2351   10/08/12 2345   levofloxacin (LEVAQUIN) IVPB 750 mg  Status:  Discontinued        750 mg 100 mL/hr over 90 Minutes Intravenous Every 24 hours 10/08/12 2342 10/08/12 2353   10/08/12 2000   ceFEPIme (MAXIPIME) 1 g in dextrose 5 % 50 mL IVPB  Status:  Discontinued        1 g 100 mL/hr over 30 Minutes Intravenous Every 8 hours 10/08/12 1820 10/08/12 1850   10/08/12 2000   ceFEPIme (MAXIPIME) 1 g in dextrose 5 % 50 mL IVPB        1 g 100 mL/hr over 30 Minutes Intravenous Every 12 hours 10/08/12 1850 10/16/12 2159   10/08/12 2000   vancomycin (VANCOCIN) IVPB 1000 mg/200 mL premix        1,000 mg 200 mL/hr over 60 Minutes Intravenous  Once 10/08/12 1850 10/08/12 2233   10/08/12 1830   levofloxacin (LEVAQUIN) IVPB 750 mg  Status:  Discontinued        750 mg 100 mL/hr over 90 Minutes Intravenous Every 24 hours 10/08/12 1820 10/08/12 1850           Time Spent in minutes   35   Thomes Burak K M.D on 10/10/2012 at 10:41 AM  Between 7am to 7pm - Pager - [phone]  After 7pm go to [email or web address] - password [email or web address]  And look for the night coverage person covering for me after hours  [facility] Office  [phone]    Subjective:   Erin Hansen today has, No headache, No chest pain, No abdominal pain - No Nausea, No new weakness tingling or numbness, No Cough - SOB.mildly confused.  Objective:   Filed Vitals:   10/09/12 1415 10/09/12 1435 10/09/12 2059 10/10/12 0624  BP: 109/68  107/67 137/76  Pulse: 89  90 96  Temp: 97.7 F (36.5 C)  98 F (36.7 C) 97.3 F (36.3 C)  TempSrc: Oral  Oral Oral  Resp: 19  20 20   Height:      Weight:    69.31 kg (152 lb 12.8 oz)  SpO2: 98% 97% 97% 93%    Wt Readings from Last 3 Encounters:  10/10/12 69.31  kg (152 lb 12.8 oz)  09/22/12  75.8 kg (167 lb 1.7 oz)  09/22/12 75.8 kg (167 lb 1.7 oz)     Intake/Output Summary (Last 24 hours) at 10/10/12 1041 Last data filed at 10/10/12 0755  Gross per 24 hour  Intake 851.25 ml  Output    725 ml  Net 126.25 ml    Exam Awake Alert, Oriented X 1, No new F.N deficits, Normal affect [place].AT,PERRAL Supple Neck,No JVD, No cervical lymphadenopathy appriciated.  Symmetrical Chest wall movement, Good air movement bilaterally, CTAB RRR,No Gallops,Rubs or new Murmurs, No Parasternal Heave +ve B.Sounds, Abd Soft, Non tender, No organomegaly appriciated, No rebound - guarding or rigidity. No Cyanosis, Clubbing or edema, No new Rash or bruise     Data Review   Micro Results Recent Results (from the past 240 hour(s))  CULTURE, BLOOD (ROUTINE X 2)     Status: Normal (Preliminary result)   Collection Time   10/08/12 12:00 AM      Component Value Range Status Comment   Specimen Description BLOOD LEFT ARM   Final    Special Requests BOTTLES DRAWN AEROBIC ONLY 1CC   Final    Culture  Setup Time 10/09/2012 14:45   Final    Culture     Final    Value:        BLOOD CULTURE RECEIVED NO GROWTH TO DATE CULTURE WILL BE HELD FOR 5 DAYS BEFORE ISSUING A FINAL NEGATIVE REPORT   Report Status PENDING   Incomplete   MRSA PCR SCREENING     Status: Normal   Collection Time   10/09/12  4:52 AM      Component Value Range Status Comment   MRSA by PCR NEGATIVE  NEGATIVE Final     Radiology Reports Dg Chest 2 View  09/15/2012  *RADIOLOGY REPORT*  Clinical Data: History of colon carcinoma; preoperative colectomy  CHEST - 2 VIEW  Comparison: Chest radiograph September 10, 2011; chest CT August 26, 2012  Findings:  There is mild scarring in the left lung base.  The lungs are otherwise clear.  Heart size and pulmonary vascularity are within normal limits.  There is a hiatal hernia. No adenopathy.  There is marked collapse of the T12 vertebral body, a stable finding.  There is localized kyphosis in the area of  T12.  IMPRESSION:   Lungs are clear except for stable left base scarring. No adenopathy.  Stable marked collapse of the T12 vertebral body.   Original Report Authenticated By: Bretta Bang, M.D.    Ct Abdomen Pelvis W Contrast  10/08/2012  *RADIOLOGY REPORT*  Clinical Data: Right-sided abdominal pain.  Post colostomy 01/14.  CT ABDOMEN AND PELVIS WITH CONTRAST  Technique:  Multidetector CT imaging of the abdomen and pelvis was performed following the standard protocol during bolus administration of intravenous contrast.  Contrast: OMNIPAQUE IOHEXOL 300 MG/ML  SOLN  Comparison: 08/26/2012  Findings: Airspace infiltration in both lung bases, greater on the right, suggesting pneumonia.  Minimal bilateral pleural effusions. Cardiac enlargement.  Moderate sized esophageal hiatal hernia.  Since the previous study, there is interval development of a subcapsular hematoma in the right lobe of the liver.  This measures about 3.5 x 8.4 cm.  This is adjacent to the previously demonstrated focal liver lesion.  Perhaps there has been interval biopsy of this lesion leading to the hematoma.  If there has been no intervention, then hemorrhage arising from the lesion could have this appearance.  Calcified granulomas in the  spleen.  The gallbladder, pancreas, adrenal glands, kidneys, and retroperitoneal lymph nodes are unremarkable.  Calcification of the abdominal aorta without aneurysm.  There have been interval postoperative changes with placement of a left upper quadrant ostomy which appears to connect to the descending colon.  The stomach, small bowel, and colon are not abnormally distended.  No free air or free fluid in the abdomen.  Pelvis:  The bladder wall is not thickened.  The uterus appears to be surgically absent.  The rectosigmoid colon is decompressed and the previously identified mass appears to been resected.  No free or loculated pelvic fluid collections.  No significant pelvic lymphadenopathy.  The  appendix is not identified.  Scoliosis and degenerative changes in the lumbar spine.  Degenerative changes in the hips.  Compression and kyphoplasty changes at T12. Spondylolysis and mild spondylolisthesis at L5-S1.  IMPRESSION: Interval development of a right hepatic subcapsular hematoma which could arisen from biopsy of the right hepatic lesion or from rupture of this lesion.  Correlation with surgical history is recommended.  Interval postoperative changes with resection of a rectosigmoid mass lesion and placement of the descending colostomy. Infiltrates in both lung bases suggesting pneumonia.   Original Report Authenticated By: Burman Nieves, M.D.    US Biopsy  09/14/2012  *RADIOLOGY REPORT*  Clinical Data: Recent diagnosis of carcinoma of the rectosigmoid colon.  Ill-defined liver lesion was visualized by CT in the right lobe of the liver and the patient presents for liver biopsy.  ULTRASOUND GUIDED CORE BIOPSY OF LIVER  Sedation:  1.0 mg IV Versed;  50 mcg IV Fentanyl  Total Moderate Sedation Time: 18 minutes.  Procedure:  The procedure, risks, benefits, and alternatives were explained to the patient.  Questions regarding the procedure were encouraged and answered.  The patient understands and consents to the procedure.  The abdominal wall was prepped with Betadine in a sterile fashion, and a sterile drape was applied covering the operative field.  A sterile gown and sterile gloves were used for the procedure. Local anesthesia was provided with 1% Lidocaine.  Preliminary ultrasound was performed of the liver.  After localizing a lesion in the right lobe, a 17 gauge needle was advanced under direct ultrasound guidance to the level of the lesion.  Coaxial 18-gauge core biopsy samples were obtained.  Three core biopsy samples were submitted in formalin.  The outer needle was removed and additional ultrasound performed.  Complications: None  Findings: A relatively hyperechoic lesion is visualized in the right  lobe of the liver in a location corresponding to the abnormality detected by CT.  This lesion measures approximately 1.6 cm in greatest diameter by ultrasound.  Solid tissue was obtained from the region of the lesion with core biopsy.  IMPRESSION: Ultrasound guided core biopsy performed of a lesion in the right lobe of the liver.  This lesion corresponds in location to the lesion depicted by CT and measures approximately 1.6 cm by ultrasound.   Original Report Authenticated By: Irish Lack, M.D.    Dg Abd Acute W/chest  10/08/2012  *RADIOLOGY REPORT*  Clinical Data: Abdominal pain, nausea/vomiting/diarrhea  ACUTE ABDOMEN SERIES (ABDOMEN 2 VIEW & CHEST 1 VIEW)  Comparison: 10/07/2012  Findings: Mild patchy left lower lobe opacity, atelectasis versus pneumonia.  Suspected right basilar atelectasis. No pleural effusion or pneumothorax.  The heart is top normal in size.  Nonobstructive bowel gas pattern.  Left lower quadrant ostomy.  No evidence of free air on the lateral decubitus view.  Degenerative changes of  the visualized thoracolumbar spine.  IMPRESSION: Mild patchy left lower lobe opacity, atelectasis versus pneumonia.  No evidence of small bowel obstruction or free air.   Original Report Authenticated By: Charline Bills, M.D.    Dg Abd Acute W/chest  10/07/2012  *RADIOLOGY REPORT*  Clinical Data: Nausea.  Abdominal pain.  History of recent ascending colectomy.  ACUTE ABDOMEN SERIES (ABDOMEN 2 VIEW & CHEST 1 VIEW)  Comparison: 09/15/2012.  Findings: Heart size upper limits of normal for projection. Emphysematous changes in the lungs with basilar atelectasis.  There is no free air underneath the hemidiaphragms.  The bowel gas pattern is nonobstructive.  Colostomy appliance is present over the left central abdomen.  Lumbar spondylosis.  There is no small or large bowel dilation.  Surgical clips project over the left sacral ala.  Monitoring leads also project over the chest and abdomen. T12 vertebral  augmentation.  IMPRESSION: Nonobstructive bowel gas pattern.  Enterostomy in the left mid abdomen.  No acute abnormality.   Original Report Authenticated By: Andreas Newport, M.D.     CBC  Lab 10/10/12 0218 10/09/12 0300 10/08/12 1600 10/07/12 1655  WBC 12.8* 11.9* 11.0* 12.5*  HGB 8.2* 8.1* 8.7* 10.0*  HCT 25.8* 24.9* 27.1* 30.5*  PLT 233 201 238 310  MCV 88.7 88.3 89.1 88.2  MCH 28.2 28.7 28.6 28.9  MCHC 31.8 32.5 32.1 32.8  RDW 14.8 14.8 14.8 14.7  LYMPHSABS -- -- 1.4 1.1  MONOABS -- -- 1.4* 0.9  EOSABS -- -- 0.0 0.0  BASOSABS -- -- 0.0 0.0  BANDABS -- -- -- --    Chemistries   Lab 10/10/12 0218 10/09/12 0300 10/08/12 1600 10/07/12 1655  NA 132* 131* 135 136  K 4.2 3.8 4.0 3.9  CL 100 99 101 98  CO2 20 21 26 26   GLUCOSE 149* 140* 140* 160*  BUN 24* 24* 28* 21  CREATININE 0.90 1.02 1.23* 0.89  CALCIUM 8.6 8.1* 8.4 9.2  MG -- -- -- --  AST -- -- 36 31  ALT -- -- 35 22  ALKPHOS -- -- 121* 146*  BILITOT -- -- 0.4 0.7   ------------------------------------------------------------------------------------------------------------------ estimated creatinine clearance is 47.8 ml/min (by C-G formula based on Cr of 0.9). ------------------------------------------------------------------------------------------------------------------ No results found for this basename: HGBA1C:2 in the last 72 hours ------------------------------------------------------------------------------------------------------------------ No results found for this basename: CHOL:2,HDL:2,LDLCALC:2,TRIG:2,CHOLHDL:2,LDLDIRECT:2 in the last 72 hours ------------------------------------------------------------------------------------------------------------------ No results found for this basename: TSH,T4TOTAL,FREET3,T3FREE,THYROIDAB in the last 72 hours ------------------------------------------------------------------------------------------------------------------  [facility] 10/09/12 1140 10/08/12 1600   VITAMINB12 -- 311  FOLATE -- 12.2  FERRITIN -- 225  TIBC -- 260  IRON -- 17*  RETICCTPCT 2.7 --    Coagulation profile  Lab 10/09/12 1833  INR 1.28  PROTIME --     Basename 10/09/12 0940  DDIMER 3.21*    Cardiac Enzymes  Lab 10/09/12 1522 10/09/12 0939 10/09/12 0300 10/08/12 2051  CKMB -- -- 3.1 4.1*  TROPONINI 0.36* 0.70* 0.79* --  MYOGLOBIN -- -- -- --   ------------------------------------------------------------------------------------------------------------------ No components found with this basename: POCBNP:3

## 2012-10-10 NOTE — Progress Notes (Signed)
Clinical Social Work Department BRIEF PSYCHOSOCIAL ASSESSMENT 10/10/2012  Patient:  Erin Hansen, Erin Hansen     Account Number:  [id number]     Admit date:  10/08/2012  Clinical Social Worker:  Hattie Perch  Date/Time:  10/10/2012 12:00 M  Referred by:  Physician  Date Referred:  10/10/2012 Referred for  SNF Placement   Other Referral:   Interview type:  Family Other interview type:    PSYCHOSOCIAL DATA Living Status:  FACILITY Admitted from facility:  [facility] Level of care:  Skilled Nursing Facility Primary support name:  Erlinda Hong Primary support relationship to patient:  CHILD, ADULT Degree of support available:   good    CURRENT CONCERNS Current Concerns  Post-Acute Placement   Other Concerns:    SOCIAL WORK ASSESSMENT / PLAN CSW met with patient and family at bedside. they confirm that patient is from [facility] skilled section and will return there upon discharge.   Assessment/plan status:   Other assessment/ plan:   Information/referral to community resources:    PATIENT'S/FAMILY'S RESPONSE TO PLAN OF CARE: agreeable to return to [facility] upon discharge.

## 2012-10-10 NOTE — Progress Notes (Signed)
PT Cancellation Note  Patient Details Name: NATANE HEWARD MRN: [id number] DOB: February 20, 1926   Cancelled Treatment:    Reason Eval/Treat Not Completed: Medical issues which prohibited therapy (per RN)   Rada Hay 10/10/2012, 1:58 [phone]

## 2012-10-10 NOTE — Progress Notes (Signed)
OT Cancellation Note  Patient Details Name: Erin Hansen MRN: [id number] DOB: Oct 24, 1925   Cancelled Treatment:    Reason Eval/Treat Not Completed: Medical issues which prohibited therapy (RN asked OT to return tomorrow 2* B PEs.)  Kache Mcclurg A OTR/L [phone] 10/10/2012, 1:49 PM

## 2012-10-10 NOTE — Progress Notes (Signed)
ANTICOAGULATION CONSULT NOTE -  Pharmacy Consult for Heparin Indication: pulmonary embolus  Allergies  Allergen Reactions  . Codeine Nausea Only    Patient Measurements: Height: 5\' 9"  (175.3 cm) Weight: 147 lb 14.9 oz (67.1 kg) IBW/kg (Calculated) : 66.2     Vital Signs: Temp: 98 F (36.7 C) (02/02 2059) Temp src: Oral (02/02 2059) BP: 107/67 mmHg (02/02 2059) Pulse Rate: 90  (02/02 2059)  Labs:  Basename 10/10/12 0218 10/09/12 1833 10/09/12 1522 10/09/12 0939 10/09/12 0300 10/08/12 2051 10/08/12 1600  HGB 8.2* -- -- -- 8.1* -- --  HCT 25.8* -- -- -- 24.9* -- 27.1*  PLT 233 -- -- -- 201 -- 238  APTT -- 36 -- -- -- -- --  LABPROT -- 15.7* -- -- -- -- --  INR -- 1.28 -- -- -- -- --  HEPARINUNFRC 0.31 -- -- -- -- -- --  CREATININE 0.90 -- -- -- 1.02 -- 1.23*  CKTOTAL -- -- -- -- 29 37 --  CKMB -- -- -- -- 3.1 4.1* --  TROPONINI -- -- 0.36* 0.70* 0.79* -- --    Estimated Creatinine Clearance: 47.8 ml/min (by C-G formula based on Cr of 0.9).   Medical History: Past Medical History  Diagnosis Date  . DJD (degenerative joint disease)   . Compression fracture     t12  . IBS (irritable bowel syndrome)   . Vaginal prolapse   . TMJ (temporomandibular joint disorder)   . Anemia   . Hearing loss   . Blood in stool   . Anxiety   . Cancer     Colon    Medications:  Prescriptions prior to admission  Medication Sig Dispense Refill  . acetaminophen (TYLENOL) 500 MG tablet Take 1,000 mg by mouth every 4 (four) hours as needed. For pain      . docusate sodium (COLACE) 100 MG capsule Take 400 mg by mouth at bedtime as needed. For constipation      . erythromycin ophthalmic ointment Place 1 application into both eyes at bedtime.      . ferrous sulfate 325 (65 FE) MG tablet Take 325 mg by mouth daily with breakfast.       . lactulose (CHRONULAC) 10 GM/15ML solution Take 40 g by mouth at bedtime.      . Melatonin 5 MG TABS Take 5 mg by mouth at bedtime as needed. For sleep       . ondansetron (ZOFRAN) 4 MG tablet Take 1 tablet (4 mg total) by mouth every 6 (six) hours.  20 tablet  0  . oxyCODONE-acetaminophen (PERCOCET/ROXICET) 5-325 MG per tablet Take 1-2 tablets by mouth every 4 (four) hours as needed. Moderate to severe pain.      . potassium chloride SA (K-DUR,KLOR-CON) 20 MEQ tablet Take 20 mEq by mouth 2 (two) times daily.      . psyllium (METAMUCIL) 58.6 % powder Take 1 packet by mouth at bedtime. Takes following lactulose.      . sertraline (ZOLOFT) 50 MG tablet Take 75 mg by mouth every morning.      . cholecalciferol (VITAMIN D) 1000 UNITS tablet Take 1,000 Units by mouth every morning.      . fish oil-omega-3 fatty acids 1000 MG capsule Take 1 g by mouth daily.      . hydrochlorothiazide (HYDRODIURIL) 25 MG tablet Take 25 mg by mouth every morning.        Scheduled:     . antiseptic oral rinse  15 mL  Mouth Rinse q12n4p  . aspirin EC  81 mg Oral Daily  . ceFEPime (MAXIPIME) IV  1 g Intravenous Q12H  . chlorhexidine  15 mL Mouth Rinse BID  . erythromycin  1 application Both Eyes QHS  . ferrous sulfate  325 mg Oral QPC breakfast  . lactulose  40 g Oral QHS  . levofloxacin (LEVAQUIN) IV  750 mg Intravenous Q48H  . metoprolol tartrate  25 mg Oral BID  . pantoprazole (PROTONIX) IV  40 mg Intravenous QHS  . potassium chloride SA  20 mEq Oral BID  . sertraline  75 mg Oral q morning - 10a  . sodium chloride  3 mL Intravenous Q12H  . sodium chloride  3 mL Intravenous Q12H  . vancomycin  1,000 mg Intravenous Q24H  . [DISCONTINUED] albuterol  2.5 mg Nebulization Q6H  . [DISCONTINUED] enoxaparin (LOVENOX) injection  70 mg Subcutaneous Q24H   Infusions:     . [EXPIRED] sodium chloride 75 mL/hr (10/09/12 1219)  . heparin 1,050 Units/hr (10/09/12 1844)  . [DISCONTINUED] sodium chloride 125 mL/hr at 10/09/12 0721   PRN: sodium chloride, acetaminophen, albuterol, HYDROcodone-acetaminophen, [COMPLETED] iohexol, morphine injection, ondansetron (ZOFRAN) IV,  sodium chloride, zolpidem, [DISCONTINUED] acetaminophen, [DISCONTINUED] HYDROcodone-acetaminophen, [DISCONTINUED] ondansetron  Assessment: 87 YOF w/DVT/PE, DVT confirmed, CT angio confirms + Pulmonary Embolism. Pharmacy asked to dose Heparin, no bolus and aim for low end tx range d/t CT showing subcapsular hematoma in R lobe of liver  First heparin level = 0.31 - will not increase rate as goal is low end of therapeutic range  Goal of Therapy:  Heparin level 0.3-0.7 units/ml --aim for low end tx range Monitor platelets by anticoagulation protocol: Yes   Plan:  Continue Heparin 1050 units/hour Repeat HL 8 hr to confirm therapeutic dose  Daily CBC & Heparin level  Terrilee Files, PharmD 10/10/2012 3:19 AM

## 2012-10-11 LAB — BASIC METABOLIC PANEL
Calcium: 8.3 mg/dL — ABNORMAL LOW (ref 8.4–10.5)
Creatinine, Ser: 0.76 mg/dL (ref 0.50–1.10)
GFR calc Af Amer: 87 mL/min — ABNORMAL LOW (ref >90)
GFR calc non Af Amer: 75 mL/min — ABNORMAL LOW (ref >90)
Sodium: 136 mEq/L (ref 135–145)

## 2012-10-11 LAB — CBC
MCHC: 32.9 g/dL (ref 30.0–36.0)
Platelets: 217 10*3/uL (ref 150–400)
RDW: 15.1 % (ref 11.5–15.5)
WBC: 10.9 10*3/uL — ABNORMAL HIGH (ref 4.0–10.5)

## 2012-10-11 LAB — HEPARIN LEVEL (UNFRACTIONATED): Heparin Unfractionated: 0.54 IU/mL (ref 0.30–0.70)

## 2012-10-11 LAB — HEMOGLOBIN AND HEMATOCRIT, BLOOD: Hemoglobin: 7.7 g/dL — ABNORMAL LOW (ref 12.0–15.0)

## 2012-10-11 MED ORDER — WARFARIN VIDEO
Freq: Once | Status: AC
  Administered 2012-10-11: 12:00:00

## 2012-10-11 MED ORDER — WARFARIN SODIUM 2.5 MG PO TABS
2.5000 mg | ORAL_TABLET | Freq: Once | ORAL | Status: AC
  Administered 2012-10-11: 2.5 mg via ORAL
  Filled 2012-10-11: qty 1

## 2012-10-11 MED ORDER — HEPARIN (PORCINE) IN NACL 100-0.45 UNIT/ML-% IJ SOLN
1200.0000 [IU]/h | INTRAMUSCULAR | Status: DC
  Administered 2012-10-11: 1200 [IU]/h via INTRAVENOUS
  Filled 2012-10-11 (×2): qty 250

## 2012-10-11 MED ORDER — COUMADIN BOOK
Freq: Once | Status: AC
  Administered 2012-10-11: 12:00:00
  Filled 2012-10-11: qty 1

## 2012-10-11 MED ORDER — WARFARIN - PHARMACIST DOSING INPATIENT: Freq: Every day | Status: DC

## 2012-10-11 MED ORDER — DEXTROSE 5 % IV SOLN
1.0000 g | Freq: Three times a day (TID) | INTRAVENOUS | Status: DC
  Administered 2012-10-11: 1 g via INTRAVENOUS
  Filled 2012-10-11: qty 1

## 2012-10-11 MED ORDER — FUROSEMIDE 40 MG PO TABS
40.0000 mg | ORAL_TABLET | Freq: Once | ORAL | Status: AC
  Administered 2012-10-11: 40 mg via ORAL
  Filled 2012-10-11: qty 1

## 2012-10-11 NOTE — Progress Notes (Signed)
Sent page to [facility] night floor coverage regarding patients Hgb this am being 7.6-was 8.2 on 2/3. Patient has had no active bleeding. Has known liver hematoma. On heparin drip for PE/DVT. This RN not sure of what point patient will require blood transfusion. Will continue to monitor. Ginny Forth

## 2012-10-11 NOTE — Progress Notes (Signed)
ANTICOAGULATION CONSULT NOTE -  Pharmacy Consult for Heparin Indication: pulmonary embolus  Allergies  Allergen Reactions  . Codeine Nausea Only   Patient Measurements: Height: 5\' 9"  (175.3 cm) Weight: 152 lb 12.8 oz (69.31 kg) IBW/kg (Calculated) : 66.2  Heparin dosing weight: 69.3kg   Vital Signs: Temp: 98.1 F (36.7 C) (02/03 2021) Temp src: Oral (02/03 2021) BP: 117/48 mmHg (02/03 2021) Pulse Rate: 86  (02/03 2021)  Labs:  Basename 10/11/12 0442 10/10/12 2007 10/10/12 1105 10/10/12 0218 10/09/12 1833 10/09/12 1522 10/09/12 0939 10/09/12 0300 10/08/12 2051  HGB 7.6* -- -- 8.2* -- -- -- -- --  HCT 23.1* -- -- 25.8* -- -- -- 24.9* --  PLT 217 -- -- 233 -- -- -- 201 --  APTT -- -- -- -- 36 -- -- -- --  LABPROT -- -- -- -- 15.7* -- -- -- --  INR -- -- -- -- 1.28 -- -- -- --  HEPARINUNFRC 0.54 0.14* 0.26* -- -- -- -- -- --  CREATININE 0.76 -- -- 0.90 -- -- -- 1.02 --  CKTOTAL -- -- -- -- -- -- -- 29 37  CKMB -- -- -- -- -- -- -- 3.1 4.1*  TROPONINI -- -- -- -- -- 0.36* 0.70* 0.79* --    Estimated Creatinine Clearance: 53.7 ml/min (by C-G formula based on Cr of 0.76).   Medical History: Past Medical History  Diagnosis Date  . DJD (degenerative joint disease)   . Compression fracture     t12  . IBS (irritable bowel syndrome)   . Vaginal prolapse   . TMJ (temporomandibular joint disorder)   . Anemia   . Hearing loss   . Blood in stool   . Anxiety   . Cancer     Colon    Medications:  Prescriptions prior to admission  Medication Sig Dispense Refill  . acetaminophen (TYLENOL) 500 MG tablet Take 1,000 mg by mouth every 4 (four) hours as needed. For pain      . docusate sodium (COLACE) 100 MG capsule Take 400 mg by mouth at bedtime as needed. For constipation      . erythromycin ophthalmic ointment Place 1 application into both eyes at bedtime.      . ferrous sulfate 325 (65 FE) MG tablet Take 325 mg by mouth daily with breakfast.       . lactulose (CHRONULAC)  10 GM/15ML solution Take 40 g by mouth at bedtime.      . Melatonin 5 MG TABS Take 5 mg by mouth at bedtime as needed. For sleep      . ondansetron (ZOFRAN) 4 MG tablet Take 1 tablet (4 mg total) by mouth every 6 (six) hours.  20 tablet  0  . oxyCODONE-acetaminophen (PERCOCET/ROXICET) 5-325 MG per tablet Take 1-2 tablets by mouth every 4 (four) hours as needed. Moderate to severe pain.      . potassium chloride SA (K-DUR,KLOR-CON) 20 MEQ tablet Take 20 mEq by mouth 2 (two) times daily.      . psyllium (METAMUCIL) 58.6 % powder Take 1 packet by mouth at bedtime. Takes following lactulose.      . sertraline (ZOLOFT) 50 MG tablet Take 75 mg by mouth every morning.      . cholecalciferol (VITAMIN D) 1000 UNITS tablet Take 1,000 Units by mouth every morning.      . fish oil-omega-3 fatty acids 1000 MG capsule Take 1 g by mouth daily.      . hydrochlorothiazide (HYDRODIURIL) 25  MG tablet Take 25 mg by mouth every morning.        Scheduled:     . antiseptic oral rinse  15 mL Mouth Rinse q12n4p  . ceFEPime (MAXIPIME) IV  1 g Intravenous Q12H  . chlorhexidine  15 mL Mouth Rinse BID  . erythromycin  1 application Both Eyes QHS  . feeding supplement  237 mL Oral BID BM  . ferrous sulfate  325 mg Oral QPC breakfast  . lactulose  40 g Oral QHS  . [COMPLETED] levofloxacin (LEVAQUIN) IV  750 mg Intravenous Q48H  . metoprolol tartrate  25 mg Oral BID  . pantoprazole (PROTONIX) IV  40 mg Intravenous QHS  . potassium chloride SA  20 mEq Oral BID  . sertraline  75 mg Oral q morning - 10a  . sodium chloride  3 mL Intravenous Q12H  . sodium chloride  3 mL Intravenous Q12H  . vancomycin  1,000 mg Intravenous Q24H  . [DISCONTINUED] aspirin EC  81 mg Oral Daily   Infusions:     . heparin 1,250 Units/hr (10/10/12 2104)  . [DISCONTINUED] heparin 1,050 Units/hr (10/09/12 1844)  . [DISCONTINUED] heparin 1,150 Units/hr (10/10/12 1926)   PRN: sodium chloride, acetaminophen, albuterol,  HYDROcodone-acetaminophen, morphine injection, ondansetron (ZOFRAN) IV, sodium chloride, zolpidem  Assessment: 87 YOF w/DVT/PE, DVT confirmed, CT angio confirms + Pulmonary Embolism. Pharmacy asked to dose Heparin, no bolus and aim for low end tx range d/t CT showing subcapsular hematoma in R lobe of liver  IV heparin currently running at 1150 units/hr, heparin level returns low at 0.14.  Patient was previously therapeutic on 1050 units/hr.    Pt to start on Coumadin bridge 2/4 with low INR goal 2 - 2.5  Hgb low, stable from yesterday.  Plts ok.  No bleed other than liver hematoma noted.    No infusion issues noted per RN/No visible bleeding per RN.      HL = 0.54- therapeutic, but will tweak to try to get @ lower end of therapeutic b/c of above issue.  Goal of Therapy:  Heparin level 0.3-0.7 units/ml --aim for low end tx range Monitor platelets by anticoagulation protocol: Yes   Plan:   Decrease Heparin rate to 1200 units/hour (12 ml/hr)  Repeat HL 8hr   Daily CBC & Heparin level  Lorenza Evangelist 10/11/2012  6:00 AM Pharmacy #: [id number]

## 2012-10-11 NOTE — Progress Notes (Signed)
ANTICOAGULATION CONSULT NOTE - Follow Up Consult  Pharmacy Consult for Heparin/Coumadin Indication: DVT/PE  Allergies  Allergen Reactions  . Codeine Nausea Only    Patient Measurements: Height: 5\' 9"  (175.3 cm) Weight: 152 lb 12.8 oz (69.31 kg) IBW/kg (Calculated) : 66.2  Heparin Dosing Weight: 69.3kg  Vital Signs: Temp: 97.6 F (36.4 C) (02/04 0630) Temp src: Oral (02/04 0630) BP: 124/71 mmHg (02/04 0630) Pulse Rate: 82  (02/04 0630)  Labs:  Basename 10/11/12 0442 10/10/12 2007 10/10/12 1105 10/10/12 0218 10/09/12 1833 10/09/12 1522 10/09/12 0939 10/09/12 0300 10/08/12 2051  HGB 7.6* -- -- 8.2* -- -- -- -- --  HCT 23.1* -- -- 25.8* -- -- -- 24.9* --  PLT 217 -- -- 233 -- -- -- 201 --  APTT -- -- -- -- 36 -- -- -- --  LABPROT -- -- -- -- 15.7* -- -- -- --  INR -- -- -- -- 1.28 -- -- -- --  HEPARINUNFRC 0.54 0.14* 0.26* -- -- -- -- -- --  CREATININE 0.76 -- -- 0.90 -- -- -- 1.02 --  CKTOTAL -- -- -- -- -- -- -- 29 37  CKMB -- -- -- -- -- -- -- 3.1 4.1*  TROPONINI -- -- -- -- -- 0.36* 0.70* 0.79* --    Estimated Creatinine Clearance: 53.7 ml/min (by C-G formula based on Cr of 0.76).   Assessment: 25 YOF w/DVT/PE, DVT confirmed, CT angio confirms + Pulmonary Embolism. Pharmacy dosing IV Heparin and pt to also start bridge with Coumadin today.  Both heparin and coumadin have low therapeutic ranges given subcapsular hematoma in R lobe of liver.  IV heparin currently running at 1200 units/hr -level was therapeutic this morning, but was adjusted slightly to aim more towards lower end of goal.   CBC: Hgb trending down, now 7.6.  No bleeding noted by RN other than the liver hematoma.  Due to patient's advanced age, concomitant levaquin, low hgb, and liver hematoma, will start very conservatively with Coumadin.    Goal of Therapy:  Heparin level 0.3-0.7 units/ml --aim for low end tx range INR 2- 2.5 -low end tx range Monitor platelets by anticoagulation protocol: Yes    Plan:  1.  Coumadin 2.5mg  po x 1 tonight 2.  Daily PT/INR 3.  Coumadin video/booklet 4.  Continue heparin at current rate and f/u HL at 1400  Haynes Hoehn, PharmD 10/11/2012 8:05 AM  Pager: [phone]

## 2012-10-11 NOTE — Progress Notes (Signed)
Physical Therapy Treatment Patient Details Name: JONI COLEGROVE MRN: [id number] DOB: 02/06/1926 Today's Date: 10/11/2012 Time: 1139-1203 PT Time Calculation (min): 24 min Co-tx with OT  PT Assessment / Plan / Recommendation Comments on Treatment Session  Pt currently at least mod assist and fatigues very quickly.  Pt SaO2 on room air 90% so pt performed mobility on 1L with SaO2 92-95% then placed back on original 1.5 L.  Pt reported some SOB with activity as well as fatigue limiting mobility.    Follow Up Recommendations  SNF     Does the patient have the potential to tolerate intense rehabilitation     Barriers to Discharge        Equipment Recommendations  None recommended by PT    Recommendations for Other Services    Frequency     Plan Discharge plan remains appropriate;Frequency remains appropriate    Precautions / Restrictions Precautions Precautions: Fall Precaution Comments: recent colostomy with ostomy bag Restrictions Weight Bearing Restrictions: No   Pertinent Vitals/Pain See above, no pain   Mobility  Bed Mobility Bed Mobility: Supine to Sit Supine to Sit: 3: Mod assist Sitting - Scoot to Edge of Bed: 3: Mod assist Details for Bed Mobility Assistance: verbal cues for technique, assist for trunk upright and use of bed pad to scoot hips EOB Transfers Transfers: Sit to Stand;Stand to Sit;Stand Pivot Transfers Sit to Stand: 3: Mod assist;From elevated surface;With upper extremity assist;From bed;From chair/3-in-1 Stand to Sit: With upper extremity assist;With armrests;To chair/3-in-1;3: Mod assist Stand Pivot Transfers: 3: Mod assist Details for Transfer Assistance: increased multimodal cues for technique including hand placement, forward lean, achieving COG (presents with posterior lean upon standing), +2 for safety, pt assisted to [facility] then stood with mod assist for hygiene and had recliner brought behind pt, pt fatigues quickly Ambulation/Gait Ambulation/Gait  Assistance: Not tested (comment) (pt too tired for [facility] transfer) Stairs: No Wheelchair Mobility Wheelchair Mobility: No    Exercises     PT Diagnosis:    PT Problem List:   PT Treatment Interventions:     PT Goals Acute Rehab PT Goals PT Goal: Supine/Side to Sit - Progress: Progressing toward goal PT Goal: Sit to Stand - Progress: Progressing toward goal  Visit Information  Last PT Received On: 10/11/12 Assistance Needed: +2    Subjective Data  Subjective: I'm tired.   Cognition  Cognition Overall Cognitive Status: Impaired Arousal/Alertness: Lethargic Behavior During Session: Lethargic Cognition - Other Comments: Pt at one point talking about getting out to keep moving and visiting her mother?  continuous cues to keep eyes open during tasks, able to follow commands with increased cues and time    Balance     End of Session PT - End of Session Equipment Utilized During Treatment: Gait belt;Oxygen Activity Tolerance: Patient limited by fatigue Patient left: in chair;with call bell/phone within reach;with chair alarm set;with family/visitor present Nurse Communication: Mobility status   GP     Keelon Zurn,KATHrine E 10/11/2012, 12:57 PM  Zenovia Jarred, PT, DPT 10/11/2012 Pager: [phone]

## 2012-10-11 NOTE — Progress Notes (Signed)
[facility]                                                                                Patient Demographics  Erin Hansen, is a 79 y.o. female  UJW:[id number]  FAO:[id number]  DOB - 09-11-1925  Admit date - 10/08/2012  Admitting Physician Carron Curie, MD  Outpatient Primary MD for the patient is Ginette Otto, MD  LOS - 3   Chief Complaint  Patient presents with  . N/V/D         Assessment & Plan   This is a mildly demented 79 year old Caucasian female who was recently admitted few weeks ago for new diagnosis of stage III colon cancer with rectum he/colostomy placement, she was discharged to a nursing home from where she presented with shortness of breath nausea vomiting due to acute PE DVT causing right ventricular strain and mild troponin the, patient has history of liver hematoma due to liver biopsy done by IR prior to her colectomy. She was started on heparin and Coumadin overlap with close monitoring of her symptoms, she has no right upper quadrant pain and her hematoma appears to be stable she's been closely followed by general surgery.  Plan is to gently bring INR up to 2-2.5 range and if she remains stable from the hematoma standpoint discharge her back to nursing home.     1. SOB due to Acute PE-DVT (R.Leg) and HCAP - clinically feels much better, white count has improved, patient is now feeling much better and is pain-free, after discussions with general surgery in the light of her liver hematoma due to a recent biopsy few weeks ago, he should be placed on heparin drip with caution and without bolus, plan is to continue heparin drip for 48 hours, liver hematoma remains stable clinically, she has been placed on Coumadin overlap with the goal of keeping INR 2-2.5 at low therapeutic range.    2.NSTEMI -  and remains chest pain-free, likely troponin rise is due to right ventricular strain from acute PE which are large, Appreciate cardiology input, as  received low-dose aspirin for 2 days we'll now discontinue as she is on heparin drip and there is small liver hematoma, low-dose Lopressor will be added. Troponin rise so far does not appear to be in ACS pattern.     3. Possible healthcare associated pneumonia. For now continue antibiotics as #1 above, CT angios above, will taper down antibiotics rapidly if continues to improve. Follow cultures.     4. Recent history of colon cancer, status post colon resection with colostomy placement, general surgery to follow, incidental finding of small liver hematoma-again surgery has been consulted,  and discussed with general surgeon Dr. Darylene Price in detail on 10-2012, hematoma likely from liver biopsy which was done prior to her colon resection, okay for anticoagulation with cautious monitoring. She continues to have no right upper quadrant discomfort and clinically hematoma appears stable. Cleared by general surgery again on 10/11/2012 for continued anticoagulation.     5. UTI. Follow cultures, antibiotics for HCAPshould suffice.     6. Mild hyponatremia. Will Ur Osm >>Sr Osm , fluid restritction and monitor.     7. Normocytic Normochromic  Anemia. Likely anemia of chronic disease, stable anemia panel, go to keep hemoglobin above 7, mild H&H fall is dilution due to IV antibiotics and IV fluids, all have been stopped, she has few days and will give gentle Lasix, will monitor H&H and a daily basis, she has been seen by general surgery today and cleared for continuation of anticoagulation.     Code Status: Full  Family Communication: With the patient, son and daughter-in-law in detail bedside.  Disposition Plan: SNF   Procedures echo gram, MRI head, CT angio chest, CT abdomen pelvis, Leg Venous US   Consults  Cards   DVT Prophylaxis  SCDs (once [place] -ve)  Lab Results  Component Value Date   PLT 217 10/11/2012    Medications  Scheduled Meds:    . antiseptic oral rinse  15 mL Mouth  Rinse q12n4p  . chlorhexidine  15 mL Mouth Rinse BID  . [COMPLETED] coumadin book   Does not apply Once  . erythromycin  1 application Both Eyes QHS  . feeding supplement  237 mL Oral BID BM  . ferrous sulfate  325 mg Oral QPC breakfast  . furosemide  40 mg Oral Once  . lactulose  40 g Oral QHS  . metoprolol tartrate  25 mg Oral BID  . pantoprazole (PROTONIX) IV  40 mg Intravenous QHS  . potassium chloride SA  20 mEq Oral BID  . sertraline  75 mg Oral q morning - 10a  . sodium chloride  3 mL Intravenous Q12H  . sodium chloride  3 mL Intravenous Q12H  . warfarin  2.5 mg Oral ONCE-1800  . [COMPLETED] warfarin   Does not apply Once  . Warfarin - Pharmacist Dosing Inpatient   Does not apply q1800   Continuous Infusions:    . heparin 1,200 Units/hr (10/11/12 0618)   PRN Meds:.sodium chloride, acetaminophen, albuterol, HYDROcodone-acetaminophen, morphine injection, ondansetron (ZOFRAN) IV, sodium chloride, zolpidem  Antibiotics    Anti-infectives     Start     Dose/Rate Route Frequency Ordered Stop   10/11/12 0900   ceFEPIme (MAXIPIME) 1 g in dextrose 5 % 50 mL IVPB  Status:  Discontinued        1 g 100 mL/hr over 30 Minutes Intravenous Every 8 hours 10/11/12 0817 10/11/12 1051   10/10/12 1800   levofloxacin (LEVAQUIN) IVPB 750 mg        750 mg 100 mL/hr over 90 Minutes Intravenous Every 48 hours 10/08/12 1850 10/10/12 2145   10/09/12 1000   vancomycin (VANCOCIN) IVPB 1000 mg/200 mL premix  Status:  Discontinued        1,000 mg 200 mL/hr over 60 Minutes Intravenous Every 24 hours 10/08/12 1850 10/11/12 1051   10/08/12 2345   ceFEPIme (MAXIPIME) 1 g in dextrose 5 % 50 mL IVPB  Status:  Discontinued        1 g 100 mL/hr over 30 Minutes Intravenous 3 times per day 10/08/12 2342 10/08/12 2351   10/08/12 2345   levofloxacin (LEVAQUIN) IVPB 750 mg  Status:  Discontinued        750 mg 100 mL/hr over 90 Minutes Intravenous Every 24 hours 10/08/12 2342 10/08/12 2353   10/08/12 2000    ceFEPIme (MAXIPIME) 1 g in dextrose 5 % 50 mL IVPB  Status:  Discontinued        1 g 100 mL/hr over 30 Minutes Intravenous Every 8 hours 10/08/12 1820 10/08/12 1850   10/08/12 2000   ceFEPIme (MAXIPIME) 1 g  in dextrose 5 % 50 mL IVPB  Status:  Discontinued        1 g 100 mL/hr over 30 Minutes Intravenous Every 12 hours 10/08/12 1850 10/11/12 0817   10/08/12 2000   vancomycin (VANCOCIN) IVPB 1000 mg/200 mL premix        1,000 mg 200 mL/hr over 60 Minutes Intravenous  Once 10/08/12 1850 10/08/12 2233   10/08/12 1830   levofloxacin (LEVAQUIN) IVPB 750 mg  Status:  Discontinued        750 mg 100 mL/hr over 90 Minutes Intravenous Every 24 hours 10/08/12 1820 10/08/12 1850           Time Spent in minutes   35   SINGH,PRASHANT K M.D on 10/11/2012 at 10:52 AM  Between 7am to 7pm - Pager - [phone]  After 7pm go to [email or web address] - password [email or web address]  And look for the night coverage person covering for me after hours  [facility] Office  [phone]    Subjective:   Jaydalee Bardwell today has, No headache, No chest pain, No abdominal pain - No Nausea, No new weakness tingling or numbness, No Cough - SOB.mildly confused.  Objective:   Filed Vitals:   10/10/12 1047 10/10/12 1300 10/10/12 2021 10/11/12 0630  BP: 134/71 130/54 117/48 124/71  Pulse: 96 79 86 82  Temp: 98 F (36.7 C) 97.7 F (36.5 C) 98.1 F (36.7 C) 97.6 F (36.4 C)  TempSrc: Oral Oral Oral Oral  Resp: 18 20 20 20   Height:      Weight:      SpO2: 99% 97% 98% 100%    Wt Readings from Last 3 Encounters:  10/10/12 69.31 kg (152 lb 12.8 oz)  09/22/12 75.8 kg (167 lb 1.7 oz)  09/22/12 75.8 kg (167 lb 1.7 oz)     Intake/Output Summary (Last 24 hours) at 10/11/12 1052 Last data filed at 10/11/12 1020  Gross per 24 hour  Intake    828 ml  Output    500 ml  Net    328 ml    Exam Awake Alert, Oriented X 1, No new F.N deficits, Normal affect [place].AT,PERRAL Supple Neck,No JVD, No cervical  lymphadenopathy appriciated.  Symmetrical Chest wall movement, Good air movement bilaterally, CTAB RRR,No Gallops,Rubs or new Murmurs, No Parasternal Heave +ve B.Sounds, Abd Soft, Non tender, No organomegaly appriciated, No rebound - guarding or rigidity. Colostomy in place No Cyanosis, Clubbing or edema, No new Rash or bruise     Data Review   Micro Results Recent Results (from the past 240 hour(s))  CULTURE, BLOOD (ROUTINE X 2)     Status: Normal (Preliminary result)   Collection Time   10/08/12 12:00 AM      Component Value Range Status Comment   Specimen Description BLOOD LEFT ARM   Final    Special Requests BOTTLES DRAWN AEROBIC ONLY 1CC   Final    Culture  Setup Time 10/09/2012 14:45   Final    Culture     Final    Value:        BLOOD CULTURE RECEIVED NO GROWTH TO DATE CULTURE WILL BE HELD FOR 5 DAYS BEFORE ISSUING A FINAL NEGATIVE REPORT   Report Status PENDING   Incomplete   CULTURE, BLOOD (ROUTINE X 2)     Status: Normal (Preliminary result)   Collection Time   10/08/12 12:00 AM      Component Value Range Status Comment   Specimen Description BLOOD LEFT HAND  Final    Special Requests BOTTLES DRAWN AEROBIC ONLY 10CC   Final    Culture  Setup Time 10/10/2012 15:32   Final    Culture     Final    Value:        BLOOD CULTURE RECEIVED NO GROWTH TO DATE CULTURE WILL BE HELD FOR 5 DAYS BEFORE ISSUING A FINAL NEGATIVE REPORT   Report Status PENDING   Incomplete   URINE CULTURE     Status: Normal   Collection Time   10/08/12  6:00 PM      Component Value Range Status Comment   Specimen Description URINE, CLEAN CATCH   Final    Special Requests NONE   Final    Culture  Setup Time 10/09/2012 15:12   Final    Colony Count NO GROWTH   Final    Culture NO GROWTH   Final    Report Status 10/10/2012 FINAL   Final   MRSA PCR SCREENING     Status: Normal   Collection Time   10/09/12  4:52 AM      Component Value Range Status Comment   MRSA by PCR NEGATIVE  NEGATIVE Final   CLOSTRIDIUM  DIFFICILE BY PCR     Status: Normal   Collection Time   10/10/12  7:53 AM      Component Value Range Status Comment   C difficile by pcr NEGATIVE  NEGATIVE Final     Radiology Reports Dg Chest 2 View  09/15/2012  *RADIOLOGY REPORT*  Clinical Data: History of colon carcinoma; preoperative colectomy  CHEST - 2 VIEW  Comparison: Chest radiograph September 10, 2011; chest CT August 26, 2012  Findings:  There is mild scarring in the left lung base.  The lungs are otherwise clear.  Heart size and pulmonary vascularity are within normal limits.  There is a hiatal hernia. No adenopathy.  There is marked collapse of the T12 vertebral body, a stable finding.  There is localized kyphosis in the area of T12.  IMPRESSION:   Lungs are clear except for stable left base scarring. No adenopathy.  Stable marked collapse of the T12 vertebral body.   Original Report Authenticated By: Bretta Bang, M.D.    Ct Abdomen Pelvis W Contrast  10/08/2012  *RADIOLOGY REPORT*  Clinical Data: Right-sided abdominal pain.  Post colostomy 01/14.  CT ABDOMEN AND PELVIS WITH CONTRAST  Technique:  Multidetector CT imaging of the abdomen and pelvis was performed following the standard protocol during bolus administration of intravenous contrast.  Contrast: OMNIPAQUE IOHEXOL 300 MG/ML  SOLN  Comparison: 08/26/2012  Findings: Airspace infiltration in both lung bases, greater on the right, suggesting pneumonia.  Minimal bilateral pleural effusions. Cardiac enlargement.  Moderate sized esophageal hiatal hernia.  Since the previous study, there is interval development of a subcapsular hematoma in the right lobe of the liver.  This measures about 3.5 x 8.4 cm.  This is adjacent to the previously demonstrated focal liver lesion.  Perhaps there has been interval biopsy of this lesion leading to the hematoma.  If there has been no intervention, then hemorrhage arising from the lesion could have this appearance.  Calcified granulomas in the  spleen.  The gallbladder, pancreas, adrenal glands, kidneys, and retroperitoneal lymph nodes are unremarkable.  Calcification of the abdominal aorta without aneurysm.  There have been interval postoperative changes with placement of a left upper quadrant ostomy which appears to connect to the descending colon.  The stomach, small bowel, and colon  are not abnormally distended.  No free air or free fluid in the abdomen.  Pelvis:  The bladder wall is not thickened.  The uterus appears to be surgically absent.  The rectosigmoid colon is decompressed and the previously identified mass appears to been resected.  No free or loculated pelvic fluid collections.  No significant pelvic lymphadenopathy.  The appendix is not identified.  Scoliosis and degenerative changes in the lumbar spine.  Degenerative changes in the hips.  Compression and kyphoplasty changes at T12. Spondylolysis and mild spondylolisthesis at L5-S1.  IMPRESSION: Interval development of a right hepatic subcapsular hematoma which could arisen from biopsy of the right hepatic lesion or from rupture of this lesion.  Correlation with surgical history is recommended.  Interval postoperative changes with resection of a rectosigmoid mass lesion and placement of the descending colostomy. Infiltrates in both lung bases suggesting pneumonia.   Original Report Authenticated By: Burman Nieves, M.D.    US Biopsy  09/14/2012  *RADIOLOGY REPORT*  Clinical Data: Recent diagnosis of carcinoma of the rectosigmoid colon.  Ill-defined liver lesion was visualized by CT in the right lobe of the liver and the patient presents for liver biopsy.  ULTRASOUND GUIDED CORE BIOPSY OF LIVER  Sedation:  1.0 mg IV Versed;  50 mcg IV Fentanyl  Total Moderate Sedation Time: 18 minutes.  Procedure:  The procedure, risks, benefits, and alternatives were explained to the patient.  Questions regarding the procedure were encouraged and answered.  The patient understands and consents to the  procedure.  The abdominal wall was prepped with Betadine in a sterile fashion, and a sterile drape was applied covering the operative field.  A sterile gown and sterile gloves were used for the procedure. Local anesthesia was provided with 1% Lidocaine.  Preliminary ultrasound was performed of the liver.  After localizing a lesion in the right lobe, a 17 gauge needle was advanced under direct ultrasound guidance to the level of the lesion.  Coaxial 18-gauge core biopsy samples were obtained.  Three core biopsy samples were submitted in formalin.  The outer needle was removed and additional ultrasound performed.  Complications: None  Findings: A relatively hyperechoic lesion is visualized in the right lobe of the liver in a location corresponding to the abnormality detected by CT.  This lesion measures approximately 1.6 cm in greatest diameter by ultrasound.  Solid tissue was obtained from the region of the lesion with core biopsy.  IMPRESSION: Ultrasound guided core biopsy performed of a lesion in the right lobe of the liver.  This lesion corresponds in location to the lesion depicted by CT and measures approximately 1.6 cm by ultrasound.   Original Report Authenticated By: Irish Lack, M.D.    Dg Abd Acute W/chest  10/08/2012  *RADIOLOGY REPORT*  Clinical Data: Abdominal pain, nausea/vomiting/diarrhea  ACUTE ABDOMEN SERIES (ABDOMEN 2 VIEW & CHEST 1 VIEW)  Comparison: 10/07/2012  Findings: Mild patchy left lower lobe opacity, atelectasis versus pneumonia.  Suspected right basilar atelectasis. No pleural effusion or pneumothorax.  The heart is top normal in size.  Nonobstructive bowel gas pattern.  Left lower quadrant ostomy.  No evidence of free air on the lateral decubitus view.  Degenerative changes of the visualized thoracolumbar spine.  IMPRESSION: Mild patchy left lower lobe opacity, atelectasis versus pneumonia.  No evidence of small bowel obstruction or free air.   Original Report Authenticated By:  Charline Bills, M.D.    Dg Abd Acute W/chest  10/07/2012  *RADIOLOGY REPORT*  Clinical Data: Nausea.  Abdominal  pain.  History of recent ascending colectomy.  ACUTE ABDOMEN SERIES (ABDOMEN 2 VIEW & CHEST 1 VIEW)  Comparison: 09/15/2012.  Findings: Heart size upper limits of normal for projection. Emphysematous changes in the lungs with basilar atelectasis.  There is no free air underneath the hemidiaphragms.  The bowel gas pattern is nonobstructive.  Colostomy appliance is present over the left central abdomen.  Lumbar spondylosis.  There is no small or large bowel dilation.  Surgical clips project over the left sacral ala.  Monitoring leads also project over the chest and abdomen. T12 vertebral augmentation.  IMPRESSION: Nonobstructive bowel gas pattern.  Enterostomy in the left mid abdomen.  No acute abnormality.   Original Report Authenticated By: Andreas Newport, M.D.     CBC  Lab 10/11/12 0923 10/11/12 0442 10/10/12 0218 10/09/12 0300 10/08/12 1600 10/07/12 1655  WBC -- 10.9* 12.8* 11.9* 11.0* 12.5*  HGB 7.7* 7.6* 8.2* 8.1* 8.7* --  HCT 23.3* 23.1* 25.8* 24.9* 27.1* --  PLT -- 217 233 201 238 310  MCV -- 88.2 88.7 88.3 89.1 88.2  MCH -- 29.0 28.2 28.7 28.6 28.9  MCHC -- 32.9 31.8 32.5 32.1 32.8  RDW -- 15.1 14.8 14.8 14.8 14.7  LYMPHSABS -- -- -- -- 1.4 1.1  MONOABS -- -- -- -- 1.4* 0.9  EOSABS -- -- -- -- 0.0 0.0  BASOSABS -- -- -- -- 0.0 0.0  BANDABS -- -- -- -- -- --    Chemistries   Lab 10/11/12 0442 10/10/12 0218 10/09/12 0300 10/08/12 1600 10/07/12 1655  NA 136 132* 131* 135 136  K 3.8 4.2 3.8 4.0 3.9  CL 107 100 99 101 98  CO2 20 20 21 26 26   GLUCOSE 121* 149* 140* 140* 160*  BUN 20 24* 24* 28* 21  CREATININE 0.76 0.90 1.02 1.23* 0.89  CALCIUM 8.3* 8.6 8.1* 8.4 9.2  MG -- -- -- -- --  AST -- -- -- 36 31  ALT -- -- -- 35 22  ALKPHOS -- -- -- 121* 146*  BILITOT -- -- -- 0.4 0.7    ------------------------------------------------------------------------------------------------------------------ estimated creatinine clearance is 53.7 ml/min (by C-G formula based on Cr of 0.76). ------------------------------------------------------------------------------------------------------------------ No results found for this basename: HGBA1C:2 in the last 72 hours ------------------------------------------------------------------------------------------------------------------ No results found for this basename: CHOL:2,HDL:2,LDLCALC:2,TRIG:2,CHOLHDL:2,LDLDIRECT:2 in the last 72 hours ------------------------------------------------------------------------------------------------------------------ No results found for this basename: TSH,T4TOTAL,FREET3,T3FREE,THYROIDAB in the last 72 hours ------------------------------------------------------------------------------------------------------------------  [facility] 10/09/12 1140 10/08/12 1600  VITAMINB12 -- 311  FOLATE -- 12.2  FERRITIN -- 225  TIBC -- 260  IRON -- 17*  RETICCTPCT 2.7 --    Coagulation profile  Lab 10/09/12 1833  INR 1.28  PROTIME --     Basename 10/09/12 0940  DDIMER 3.21*    Cardiac Enzymes  Lab 10/09/12 1522 10/09/12 0939 10/09/12 0300 10/08/12 2051  CKMB -- -- 3.1 4.1*  TROPONINI 0.36* 0.70* 0.79* --  MYOGLOBIN -- -- -- --   ------------------------------------------------------------------------------------------------------------------ No components found with this basename: POCBNP:3

## 2012-10-11 NOTE — Progress Notes (Signed)
BRIEF PHARMACY UPDATE CONCERNING:  IV Heparin level  Pt on IV heparin/Coumadin for PE/DVT in setting of liver hematoma.  Therapeutic goal ranges for both heparin and coumadin are towards the LOW end of the range (Heparin: ~0.3-0.5, Coumadin 2.0-2.5)    Heparin rate has been slowly increased to bring into therapeutic range.    Heparin level this morning was therapeutic at 0.54, however to aim more towards low end, rate was decreased slightly from 1250 units/hr to 1200 units/hr  Heparin level now returned at 0.46 - remains therapeutic.  Will not adjust rate at this time.  Will follow-up repeat confirmation heparin level in 8 hours.    No issues per RN  1st dose coumadin scheduled for tonight  Haynes Hoehn, PharmD 10/11/2012 2:53 PM  Pager: [phone]

## 2012-10-11 NOTE — Evaluation (Signed)
Occupational Therapy Evaluation Patient Details Name: Erin Hansen MRN: [id number] DOB: Jun 06, 1926 Today's Date: 10/11/2012 Time: 7829-5621 OT Time Calculation (min): 28 min  OT Assessment / Plan / Recommendation Clinical Impression  Pt presents with B PEs. SaO2 on room air 90% so pt performed mobility on 1L with SaO2 92-95% then placed back on original 1.5 L. Pt extremely lethargic through session with max cues needed to keep eyes open. Skilled OT indicated to maximize independence with BADLs to setup/min-mod A level in prep for d/c to next venue.    OT Assessment  Patient needs continued OT Services    Follow Up Recommendations  SNF;Supervision/Assistance - 24 hour    Barriers to Discharge Decreased caregiver support    Equipment Recommendations  3 in 1 bedside comode    Recommendations for Other Services    Frequency  Min 2X/week    Precautions / Restrictions Precautions Precautions: Fall Precaution Comments: recent colostomy with ostomy bag  Restrictions Weight Bearing Restrictions: No   Pertinent Vitals/Pain Pt denied pain.    ADL  Grooming: Minimal assistance;Wash/dry face Where Assessed - Grooming: Unsupported sitting Upper Body Bathing: Moderate assistance Where Assessed - Upper Body Bathing: Unsupported sitting Lower Body Bathing: Maximal assistance Where Assessed - Lower Body Bathing: Supported sit to stand Upper Body Dressing: Moderate assistance Where Assessed - Upper Body Dressing: Unsupported sitting Lower Body Dressing: +1 Total assistance Where Assessed - Lower Body Dressing: Supported sit to Pharmacist, hospital: Moderate assistance Toilet Transfer Method: Stand pivot Toilet Transfer Equipment: Bedside commode Toileting - Clothing Manipulation and Hygiene: +1 Total assistance Where Assessed - Toileting Clothing Manipulation and Hygiene: Sit to stand from 3-in-1 or toilet Equipment Used: Rolling walker;Gait belt ADL Comments: Pt fatigues very quickly.  Pt took pivotal steps to [facility] with +2 A then recliner was pulled up behind pt. Reported some SOB but O2 sats remained stable on 1L of O2. Pt only able to stand long enough for therapist to quickly provide peri care.    OT Diagnosis: Generalized weakness  OT Problem List: Decreased strength;Decreased activity tolerance;Decreased safety awareness;Decreased cognition;Decreased knowledge of use of DME or AE OT Treatment Interventions: Self-care/ADL training;Therapeutic activities;DME and/or AE instruction;Patient/family education   OT Goals Acute Rehab OT Goals OT Goal Formulation: With patient Time For Goal Achievement: 10/25/12 Potential to Achieve Goals: Good ADL Goals Pt Will Perform Grooming: with set-up;Sitting, chair;Sitting, edge of bed ADL Goal: Grooming - Progress: Goal set today Pt Will Perform Upper Body Bathing: with set-up;Sitting, edge of bed;Sitting, chair;Unsupported ADL Goal: Upper Body Bathing - Progress: Goal set today Pt Will Perform Lower Body Bathing: with min assist;Sit to stand from chair;Sit to stand from bed ADL Goal: Lower Body Bathing - Progress: Goal set today Pt Will Perform Upper Body Dressing: with set-up;Sitting, bed;Sitting, chair;Unsupported ADL Goal: Upper Body Dressing - Progress: Goal set today Pt Will Perform Lower Body Dressing: with mod assist;Sit to stand from chair;Sit to stand from bed ADL Goal: Lower Body Dressing - Progress: Goal set today Pt Will Transfer to Toilet: Ambulation;with DME;Stand pivot transfer;Other (comment) (with minguard A) ADL Goal: Toilet Transfer - Progress: Goal set today Pt Will Perform Toileting - Clothing Manipulation: with min assist;Sitting on 3-in-1 or toilet;Standing ADL Goal: Toileting - Clothing Manipulation - Progress: Goal set today Pt Will Perform Toileting - Hygiene: with min assist;Sit to stand from 3-in-1/toilet ADL Goal: Toileting - Hygiene - Progress: Goal set today  Visit Information  Last OT Received On:  10/11/12 Assistance Needed: +2 PT/OT Co-Evaluation/Treatment:  Yes    Subjective Data  Subjective: My head just feels funny. Patient Stated Goal: Not asked.   Prior Functioning     Home Living Lives With: Alone Available Help at Discharge: Available PRN/intermittently Type of Home: Independent living facility Home Access: Level entry;Elevator (apt on 3rd floor.) Home Layout: One level Bathroom Shower/Tub: Health visitor: Handicapped height Home Adaptive Equipment: Walker - rolling;Straight cane;Grab bars in shower;Grab bars around toilet;Built-in shower seat Prior Function Level of Independence: Independent with assistive device(s);Needs assistance Needs Assistance: Light Housekeeping Meal Prep: Total Light Housekeeping: Total Able to Take Stairs?: No Driving: No Vocation: Retired Musician: HOH Dominant Hand: Right         Vision/Perception     Copywriter, advertising Overall Cognitive Status: Impaired Arousal/Alertness: Youth worker During Session: Art gallery manager - Other Comments: Pt at one point talking about getting out to keep moving and visiting her mother?  continuous cues to keep eyes open during tasks, able to follow commands with increased cues and time    Extremity/Trunk Assessment Right Upper Extremity Assessment RUE ROM/Strength/Tone: Deficits RUE ROM/Strength/Tone Deficits: Pt with generalized weakness per functional observation Left Upper Extremity Assessment LUE ROM/Strength/Tone: Deficits LUE ROM/Strength/Tone Deficits: Pt with generalized weakness per functional observation     Mobility Bed Mobility Bed Mobility: Supine to Sit Supine to Sit: 3: Mod assist Sitting - Scoot to Edge of Bed: 3: Mod assist Details for Bed Mobility Assistance: verbal cues for technique, assist for trunk upright and use of bed pad to scoot hips EOB Transfers Sit to Stand: 3: Mod assist;From elevated surface;With upper  extremity assist;From bed;From chair/3-in-1 Stand to Sit: With upper extremity assist;With armrests;To chair/3-in-1;3: Mod assist Details for Transfer Assistance: increased multimodal cues for technique including hand placement, forward lean, achieving COG (presents with posterior lean upon standing), +2 for safety, pt assisted to [employer] then stood with mod assist for hygiene and had recliner brought behind pt, pt fatigues quickly     Exercise     Balance Static Sitting Balance Static Sitting - Balance Support: Bilateral upper extremity supported;Feet supported Static Sitting - Level of Assistance: 5: Stand by assistance;4: Min assist   End of Session OT - End of Session Equipment Utilized During Treatment: Gait belt Activity Tolerance: Patient limited by fatigue Patient left: in chair;with call bell/phone within Hansen Nurse Communication: Mobility status;Other (comment) (Pt's ongoing lethargy)  GO     Doral Digangi A OTR/L [phone] 10/11/2012, 2:47 PM

## 2012-10-11 NOTE — Progress Notes (Signed)
Patient ID: Erin Hansen, female   DOB: 1926/01/23, 79 y.o.   MRN: [id number]    Subjective: Pt feels ok, no complaints except cold.  Objective: Vital signs in last 24 hours: Temp:  [97.6 F (36.4 C)-98.1 F (36.7 C)] 97.6 F (36.4 C) (02/04 0630) Pulse Rate:  [79-96] 82  (02/04 0630) Resp:  [18-20] 20  (02/04 0630) BP: (117-134)/(48-71) 124/71 mmHg (02/04 0630) SpO2:  [97 %-100 %] 100 % (02/04 0630) Last BM Date: 10/11/12 (sm amt of stool in colostomy pouch)  Intake/Output from previous day: 02/03 0701 - 02/04 0700 In: 778 [P.O.:120; I.V.:408; IV Piggyback:250] Out: 600 [Stool:600] Intake/Output this shift:    PE: Abd: soft, NT, Nd, +BS  Lab Results:   Basename 10/11/12 0442 10/10/12 0218  WBC 10.9* 12.8*  HGB 7.6* 8.2*  HCT 23.1* 25.8*  PLT 217 233   BMET  Basename 10/11/12 0442 10/10/12 0218  NA 136 132*  K 3.8 4.2  CL 107 100  CO2 20 20  GLUCOSE 121* 149*  BUN 20 24*  CREATININE 0.76 0.90  CALCIUM 8.3* 8.6   PT/INR  Basename 10/09/12 1833  LABPROT 15.7*  INR 1.28   CMP     Component Value Date/Time   NA 136 10/11/2012 0442   K 3.8 10/11/2012 0442   CL 107 10/11/2012 0442   CO2 20 10/11/2012 0442   GLUCOSE 121* 10/11/2012 0442   BUN 20 10/11/2012 0442   CREATININE 0.76 10/11/2012 0442   CALCIUM 8.3* 10/11/2012 0442   PROT 5.9* 10/08/2012 1600   ALBUMIN 2.6* 10/08/2012 1600   AST 36 10/08/2012 1600   ALT 35 10/08/2012 1600   ALKPHOS 121* 10/08/2012 1600   BILITOT 0.4 10/08/2012 1600   GFRNONAA 75* 10/11/2012 0442   GFRAA 87* 10/11/2012 0442   Lipase     Component Value Date/Time   LIPASE 16 10/08/2012 1600       Studies/Results: Ct Angio Chest Pe W/cm &/or Wo Cm  10/09/2012  *RADIOLOGY REPORT*  Clinical Data: Shortness of breath, nausea and vomiting.  Right- sided chest pain.  CT ANGIOGRAPHY CHEST  Technique:  Multidetector CT imaging of the chest using the standard protocol during bolus administration of intravenous contrast. Multiplanar reconstructed images  including MIPs were obtained and reviewed to evaluate the vascular anatomy.  Contrast: OMNIPAQUE IOHEXOL 350 MG/ML SOLN  Comparison: 08/26/2012  Findings: Technically adequate study with good opacification of the central and segmental pulmonary arteries.  Multiple filling defects are demonstrated in the right main pulmonary artery and multiple upper and lower lobe segmental and subsegmental branches bilaterally.  The examination is positive for pulmonary embolus. Mild right heart dilatation may represent some degree of straining.  Cardiac enlargement.  Normal caliber thoracic aorta with calcification.  No significant lymphadenopathy in the chest.  The esophagus is decompressed.  Visualized portions of the upper abdominal organs demonstrate calcified granulomas in the spleen and a subcapsular hematoma in the right liver as previously discussed on CT abdomen and pelvis from 10/08/2012.  Moderate sized esophageal hiatal hernia.  Small bilateral pleural effusions greater on the right side.  There is airspace infiltration in both lung bases suggesting pneumonia.  Mild diffuse emphysematous changes bilaterally.  Bronchial wall thickening consistent with chronic bronchitis.  Airways appear patent.  No pneumothorax. Normal alignment of the thoracic vertebrae.  No compression deformities demonstrated.  Note that the T12 vertebra seen to decompressed on prior images is not included on this study.  No destructive bone lesions appreciated.  IMPRESSION: Positive study for bilateral pulmonary emboli including central right pulmonary embolus.  Small bilateral pleural effusions with airspace disease in both lung bases.  Critical Value/emergent results were called by telephone at the time of interpretation on 10/09/2012 at 2219 hours to Debbie, the patient's nurse, who verbally acknowledged these results.   Original Report Authenticated By: Burman Nieves, M.D.    Mr Brain Wo Contrast  10/09/2012  *RADIOLOGY REPORT*   Clinical Data: Presyncopal episodes.  Mild dysarthria and confusion.  The examination had to be discontinued prior to completion due to pain.  MRI HEAD WITHOUT CONTRAST  Technique:  Multiplanar, multiecho pulse sequences of the brain and surrounding structures were obtained according to standard protocol without intravenous contrast.  Comparison: None.  Findings: No acute infarct, hemorrhage, or mass lesion is present. Moderate generalized atrophy is present.  Periventricular and scattered subcortical T2 hyperintensities are advanced for age as well.  The flow is present in the major intracranial arteries.  The globes and orbits are intact.  The paranasal sinuses are clear. There is some fluid in the left mastoid air cells.  No obstructing nasopharyngeal lesion is evident.  IMPRESSION:  1.  No acute intracranial abnormality. 2.  Moderate atrophy white matter disease.  This likely reflects the sequelae of chronic microvascular ischemia.   Original Report Authenticated By: Marin Roberts, M.D.    US Abdomen Complete  10/09/2012  *RADIOLOGY REPORT*  Clinical Data:  Right upper quadrant pain  COMPLETE ABDOMINAL ULTRASOUND  Comparison:  CT abdomen pelvis dated 10/08/2012.  Biopsy ultrasound dated 09/14/2012.  Findings:  Gallbladder:  Gallbladder sludge.  No gallstones, gallbladder wall thickening, or pericholecystic fluid.  Negative sonographic Murphy's sign.  Common bile duct:  Measures 4 mm.  Liver:  7.8 x 3.7 x 6.7 cm complex subcapsular fluid collection/hematoma overlying the right hepatic lobe.  1.6 x 1.7 x 1.7 cm hyperechoic lesion in the central right hepatic lobe, unchanged from prior biopsy ultrasound.  IVC:  Appears normal.  Pancreas:  Incompletely visualized but grossly unremarkable.  Spleen:  Measures 7.4 cm.  Calcified splenic granulomata.  Right Kidney:  Measures 11.2 cm.  No mass or hydronephrosis.  Left Kidney:  Measures 10.6 cm.  No mass or hydronephrosis.  Abdominal aorta:  No aneurysm  identified.  IMPRESSION: 7.8 cm complex subcapsular fluid collection/hematoma overlying the right hepatic lobe.  1.7 cm hyperechoic lesion in the central right hepatic lobe, unchanged from prior biopsy ultrasound.   Original Report Authenticated By: Charline Bills, M.D.     Anti-infectives: Anti-infectives     Start     Dose/Rate Route Frequency Ordered Stop   10/11/12 0900   ceFEPIme (MAXIPIME) 1 g in dextrose 5 % 50 mL IVPB        1 g 100 mL/hr over 30 Minutes Intravenous Every 8 hours 10/11/12 0817     10/10/12 1800   levofloxacin (LEVAQUIN) IVPB 750 mg        750 mg 100 mL/hr over 90 Minutes Intravenous Every 48 hours 10/08/12 1850 10/10/12 2145   10/09/12 1000   vancomycin (VANCOCIN) IVPB 1000 mg/200 mL premix        1,000 mg 200 mL/hr over 60 Minutes Intravenous Every 24 hours 10/08/12 1850 10/16/12 1559   10/08/12 2345   ceFEPIme (MAXIPIME) 1 g in dextrose 5 % 50 mL IVPB  Status:  Discontinued        1 g 100 mL/hr over 30 Minutes Intravenous 3 times per day 10/08/12 2342 10/08/12 2351  10/08/12 2345   levofloxacin (LEVAQUIN) IVPB 750 mg  Status:  Discontinued        750 mg 100 mL/hr over 90 Minutes Intravenous Every 24 hours 10/08/12 2342 10/08/12 2353   10/08/12 2000   ceFEPIme (MAXIPIME) 1 g in dextrose 5 % 50 mL IVPB  Status:  Discontinued        1 g 100 mL/hr over 30 Minutes Intravenous Every 8 hours 10/08/12 1820 10/08/12 1850   10/08/12 2000   ceFEPIme (MAXIPIME) 1 g in dextrose 5 % 50 mL IVPB  Status:  Discontinued        1 g 100 mL/hr over 30 Minutes Intravenous Every 12 hours 10/08/12 1850 10/11/12 0817   10/08/12 2000   vancomycin (VANCOCIN) IVPB 1000 mg/200 mL premix        1,000 mg 200 mL/hr over 60 Minutes Intravenous  Once 10/08/12 1850 10/08/12 2233   10/08/12 1830   levofloxacin (LEVAQUIN) IVPB 750 mg  Status:  Discontinued        750 mg 100 mL/hr over 90 Minutes Intravenous Every 24 hours 10/08/12 1820 10/08/12 1850            Assessment/Plan  1. S/p lap chole 2. Hematoma from recent liver bx a week prior to surgery 3. PE  Plan: 1. hgb relatively stable.  Hematoma from liver biopsy a week prior to surgery.  Ok to anticoagulate for PEs.   LOS: 3 days    Aradhana Gin E 10/11/2012, 9:09 AM Pager: [phone]

## 2012-10-11 NOTE — Progress Notes (Signed)
I did her surgery and the liver biopsy was a week prior to that. She had visible hematoma on liver at time of surgery.  Her stoma is functional and abdomen soft.  I think she can get transitioned to lovenox and begin coumadin soon as well

## 2012-10-11 NOTE — Progress Notes (Signed)
ANTIBIOTIC CONSULT NOTE - FOLLOW-UP  Pharmacy Consult for vancomycin/Pharmacy to adjust antibiotics for renal fx (cefepime/levofloxacin) Indication: HCAP  Allergies  Allergen Reactions  . Codeine Nausea Only    Patient Measurements: Height: 5\' 9"  (175.3 cm) Weight: 152 lb 12.8 oz (69.31 kg) IBW/kg (Calculated) : 66.2  Adjusted Body Weight:   Vital Signs: Temp: 97.6 F (36.4 C) (02/04 0630) Temp src: Oral (02/04 0630) BP: 124/71 mmHg (02/04 0630) Pulse Rate: 82  (02/04 0630) Intake/Output from previous day: 02/03 0701 - 02/04 0700 In: 778 [P.O.:120; I.V.:408; IV Piggyback:250] Out: 600 [Stool:600] Intake/Output from this shift:    Labs:  Basename 10/11/12 0442 10/10/12 0218 10/09/12 0300  WBC 10.9* 12.8* 11.9*  HGB 7.6* 8.2* 8.1*  PLT 217 233 201  LABCREA -- -- --  CREATININE 0.76 0.90 1.02   Estimated Creatinine Clearance: 53.7 ml/min (by C-G formula based on Cr of 0.76). No results found for this basename: VANCOTROUGH:2,VANCOPEAK:2,VANCORANDOM:2,GENTTROUGH:2,GENTPEAK:2,GENTRANDOM:2,TOBRATROUGH:2,TOBRAPEAK:2,TOBRARND:2,AMIKACINPEAK:2,AMIKACINTROU:2,AMIKACIN:2, in the last 72 hours   Microbiology: Recent Results (from the past 720 hour(s))  SURGICAL PCR SCREEN     Status: Normal   Collection Time   09/15/12 11:30 AM      Component Value Range Status Comment   MRSA, PCR NEGATIVE  NEGATIVE Final    Staphylococcus aureus NEGATIVE  NEGATIVE Final   CULTURE, BLOOD (ROUTINE X 2)     Status: Normal (Preliminary result)   Collection Time   10/08/12 12:00 AM      Component Value Range Status Comment   Specimen Description BLOOD LEFT ARM   Final    Special Requests BOTTLES DRAWN AEROBIC ONLY 1CC   Final    Culture  Setup Time 10/09/2012 14:45   Final    Culture     Final    Value:        BLOOD CULTURE RECEIVED NO GROWTH TO DATE CULTURE WILL BE HELD FOR 5 DAYS BEFORE ISSUING A FINAL NEGATIVE REPORT   Report Status PENDING   Incomplete   CULTURE, BLOOD (ROUTINE X 2)      Status: Normal (Preliminary result)   Collection Time   10/08/12 12:00 AM      Component Value Range Status Comment   Specimen Description BLOOD LEFT HAND   Final    Special Requests BOTTLES DRAWN AEROBIC ONLY 10CC   Final    Culture  Setup Time 10/10/2012 15:32   Final    Culture     Final    Value:        BLOOD CULTURE RECEIVED NO GROWTH TO DATE CULTURE WILL BE HELD FOR 5 DAYS BEFORE ISSUING A FINAL NEGATIVE REPORT   Report Status PENDING   Incomplete   URINE CULTURE     Status: Normal   Collection Time   10/08/12  6:00 PM      Component Value Range Status Comment   Specimen Description URINE, CLEAN CATCH   Final    Special Requests NONE   Final    Culture  Setup Time 10/09/2012 15:12   Final    Colony Count NO GROWTH   Final    Culture NO GROWTH   Final    Report Status 10/10/2012 FINAL   Final   MRSA PCR SCREENING     Status: Normal   Collection Time   10/09/12  4:52 AM      Component Value Range Status Comment   MRSA by PCR NEGATIVE  NEGATIVE Final   CLOSTRIDIUM DIFFICILE BY PCR     Status: Normal  Collection Time   10/10/12  7:53 AM      Component Value Range Status Comment   C difficile by pcr NEGATIVE  NEGATIVE Final     Medical History: Past Medical History  Diagnosis Date  . DJD (degenerative joint disease)   . Compression fracture     t12  . IBS (irritable bowel syndrome)   . Vaginal prolapse   . TMJ (temporomandibular joint disorder)   . Anemia   . Hearing loss   . Blood in stool   . Anxiety   . Cancer     Colon    Assessment: 29 YOF on D#4 broad spectrum antibiotics for HCAP.  PMHx pertinent for recent dx of colon cancer with laparoscopic sigmoid colectomy.  Has not received any chemotherapy to date.    2/1 >> vancomycin  >> 2/1 >> cefepime  >>   2/1 >> levofloxacin  >> 2/3    Tmax:Afeb WBCs: 10.9 Renal:SCr=0.76 (down), CrCl=10ml/min (N58)  Cultures: 2/1 blood x 2: NGTD 2/1 urine: NGF 2/1: cdiff: negative 2/2 MRSA PCR neg  Dose changes/drug  level info:  2/4: Adjusted Cefepime 1gram IV q 12 hours-->q 8 hours due to improved renal function.   Will also check vancomycin trough at steady state tomorrow   Goal of Therapy:  Vancomycin trough level 15-20 mcg/ml  Plan:   Continue Vancomycin 1 gram IV q 24 h, adjusted cefepime to 1 gram IV q 8 hours  F/u renal fxn, cultures, T, WBC, clinical course  Vancomycin trough 2/5   Erin Hansen E 10/11/2012,8:17 AM

## 2012-10-11 NOTE — Consult Note (Addendum)
WOC ostomy consult: Patient readmitted after recent discharge for non-surgical reason  Stoma type/location: LLQ Colosotmy Stomal assessment/size: 1 and 1/4 inches (inital post operative stomal edema has resolved). Stoma tips downward slightly, but is still elevated above skin level. Peristomal assessment: intact Treatment options for stomal/peristomal skin: none indicated Output soft brown stool Ostomy pouching: 2pc., 2 and 1/4 inch pouching system.  Pouch #234, Skin Barrier #[phone] I will not follow.  Please re-consult if needed. Thanks, Ladona Mow, MSN, RN, [facility], CWOCN [phone])

## 2012-10-12 LAB — HEPARIN LEVEL (UNFRACTIONATED)
Heparin Unfractionated: 0.23 IU/mL — ABNORMAL LOW (ref 0.30–0.70)
Heparin Unfractionated: 0.37 IU/mL (ref 0.30–0.70)

## 2012-10-12 LAB — CBC
HCT: 24.4 % — ABNORMAL LOW (ref 36.0–46.0)
MCHC: 32.8 g/dL (ref 30.0–36.0)
MCV: 87.5 fL (ref 78.0–100.0)
Platelets: 267 10*3/uL (ref 150–400)
RDW: 14.9 % (ref 11.5–15.5)
WBC: 10.6 10*3/uL — ABNORMAL HIGH (ref 4.0–10.5)

## 2012-10-12 LAB — BASIC METABOLIC PANEL
BUN: 13 mg/dL (ref 6–23)
CO2: 24 mEq/L (ref 19–32)
Calcium: 8.2 mg/dL — ABNORMAL LOW (ref 8.4–10.5)
Chloride: 106 mEq/L (ref 96–112)
Creatinine, Ser: 0.75 mg/dL (ref 0.50–1.10)
GFR calc Af Amer: 87 mL/min — ABNORMAL LOW (ref >90)
Potassium: 2.9 mEq/L — ABNORMAL LOW (ref 3.5–5.1)
Sodium: 140 mEq/L (ref 135–145)

## 2012-10-12 LAB — PROTIME-INR: INR: 1.33 (ref 0.00–1.49)

## 2012-10-12 MED ORDER — WARFARIN SODIUM 2.5 MG PO TABS
2.5000 mg | ORAL_TABLET | Freq: Once | ORAL | Status: AC
  Administered 2012-10-12: 2.5 mg via ORAL
  Filled 2012-10-12: qty 1

## 2012-10-12 MED ORDER — POTASSIUM CHLORIDE CRYS ER 20 MEQ PO TBCR
40.0000 meq | EXTENDED_RELEASE_TABLET | ORAL | Status: AC
  Administered 2012-10-12 (×3): 40 meq via ORAL
  Filled 2012-10-12 (×3): qty 2

## 2012-10-12 MED ORDER — HEPARIN (PORCINE) IN NACL 100-0.45 UNIT/ML-% IJ SOLN
1300.0000 [IU]/h | INTRAMUSCULAR | Status: DC
  Administered 2012-10-12: 1300 [IU]/h via INTRAVENOUS
  Filled 2012-10-12 (×2): qty 250

## 2012-10-12 MED ORDER — POTASSIUM CHLORIDE CRYS ER 20 MEQ PO TBCR
20.0000 meq | EXTENDED_RELEASE_TABLET | Freq: Two times a day (BID) | ORAL | Status: DC
  Administered 2012-10-12 – 2012-10-14 (×4): 20 meq via ORAL
  Filled 2012-10-12 (×6): qty 1

## 2012-10-12 MED ORDER — MAGNESIUM SULFATE 40 MG/ML IJ SOLN
2.0000 g | Freq: Once | INTRAMUSCULAR | Status: AC
  Administered 2012-10-12: 2 g via INTRAVENOUS
  Filled 2012-10-12: qty 50

## 2012-10-12 NOTE — Progress Notes (Signed)
ANTICOAGULATION CONSULT NOTE - Follow Up Consult  Pharmacy Consult for Heparin/Coumadin Indication: DVT/PE  Allergies  Allergen Reactions  . Codeine Nausea Only    Patient Measurements: Height: 5\' 9"  (175.3 cm) Weight: 152 lb 12.8 oz (69.31 kg) IBW/kg (Calculated) : 66.2  Heparin Dosing Weight: 69.3kg  Vital Signs: Temp: 98.1 F (36.7 C) (02/05 1337) Temp src: Oral (02/05 1337) BP: 123/52 mmHg (02/05 1337) Pulse Rate: 68  (02/05 1337)  Labs:  Basename 10/12/12 1351 10/12/12 0435 10/11/12 2215 10/11/12 0923 10/11/12 0442 10/10/12 0218 10/09/12 1833 10/09/12 1522  HGB -- 8.0* -- 7.7* -- -- -- --  HCT -- 24.4* -- 23.3* 23.1* -- -- --  PLT -- 267 -- -- 217 233 -- --  APTT -- -- -- -- -- -- 36 --  LABPROT -- 16.2* -- -- -- -- 15.7* --  INR -- 1.33 -- -- -- -- 1.28 --  HEPARINUNFRC 0.37 0.23* 0.30 -- -- -- -- --  CREATININE -- 0.75 -- -- 0.76 0.90 -- --  CKTOTAL -- -- -- -- -- -- -- --  CKMB -- -- -- -- -- -- -- --  TROPONINI -- -- -- -- -- -- -- 0.36*    Estimated Creatinine Clearance: 53.7 ml/min (by C-G formula based on Cr of 0.75).   Assessment: 87 YOF on IV heparin/Coumadin bridge for DVT/PE.  Both heparin and coumadin have low therapeutic ranges given subcapsular hematoma in R lobe of liver.  IV heparin currently running at 1300 units/hr -level has returned therapeutic @ 0.37.  Will leave heparin at current rate.  INR 1.33 after 1 dose coumadin.   CBC ok, No bleeding noted by RN other than the liver hematoma.  Due to patient's advanced age, low hgb, and liver hematoma, will dose conservatively with Coumadin.    Goal of Therapy:  Heparin level 0.3-0.7 units/ml --aim for low end tx range INR 2- 2.5 -low end tx range Monitor platelets by anticoagulation protocol: Yes   Plan:  1.  Coumadin 2.5mg  po x 1 tonight 2.  Daily PT/INR 3.  Continue heparin at current rate and f/u confirmation HL at 2200  Haynes Hoehn, PharmD 10/12/2012 2:41 PM  Pager: [phone]

## 2012-10-12 NOTE — Progress Notes (Signed)
Physical Therapy Treatment Patient Details Name: Erin Hansen MRN: [id number] DOB: [birth year] Today's Date: 10/12/2012 Time: 4098-1191 PT Time Calculation (min): 31 min  PT Assessment / Plan / Recommendation Comments on Treatment Session  Pt demon limited activity tolerance with max c/o fatigue.  Assisted pt OOB to amb to BR then attempted to amb in hallway however too fatigued.  Pt plans to D/C to [facility].    Follow Up Recommendations  SNF     Does the patient have the potential to tolerate intense rehabilitation     Barriers to Discharge        Equipment Recommendations  None recommended by PT    Recommendations for Other Services    Frequency Min 3X/week   Plan Discharge plan remains appropriate    Precautions / Restrictions Precautions Precautions: Fall Precaution Comments: recent colostomy with ostomy bag  Restrictions Weight Bearing Restrictions: No   Pertinent Vitals/Pain No c/o pain    Mobility  Bed Mobility Bed Mobility: Supine to Sit Supine to Sit: 3: Mod assist Sitting - Scoot to Edge of Bed: 3: Mod assist Details for Bed Mobility Assistance: increased time and use of pad to swival hips around to get pt to EOB Transfers Transfers: Sit to Stand;Stand to Sit Sit to Stand: 3: Mod assist;From bed Stand to Sit: To toilet;3: Mod assist Details for Transfer Assistance: increased time and 50% VC's on safety and hand placement esp with stand to sit Ambulation/Gait Ambulation/Gait Assistance: 1: +2 Total assist Ambulation Distance (Feet): 24 Feet (12 ' x 2) Assistive device: Rolling walker Ambulation/Gait Assistance Details: 50% VC's on proper walker to self distance and upright posture as pt c/o MAX fatigue. Gait Pattern: Step-through pattern;Decreased stride length;Shuffle;Trunk flexed Gait velocity: decreased    PT Goals                                                 progressing    Visit Information  Last PT Received On: 10/12/12 Assistance  Needed: +2    Subjective Data  Subjective: I'll try but I feel so tiered Patient Stated Goal: to return to [facility]   Cognition       Balance   fair-  End of Session PT - End of Session Equipment Utilized During Treatment: Gait belt Activity Tolerance: Patient limited by fatigue Patient left: in chair Nurse Communication: Mobility status   Felecia Shelling  PTA [facility]  Acute  Rehab Pager      [phone]

## 2012-10-12 NOTE — Progress Notes (Addendum)
[facility] PROGRESS NOTE  MEGAHN KILLINGS ZOX:[id number] DOB: 1926-08-22 DOA: 10/08/2012 PCP: Ginette Otto, MD  Assessment/Plan: 1. SOB due to Acute PE-DVT (R.Leg) and HCAP - clinically feels much better, white count has improved, patient is now feeling much better and is pain-free, after discussions with general surgery in the light of her liver hematoma due to a recent biopsy few weeks ago, he should be placed on heparin drip with caution and without bolus, plan is to continue heparin drip for 48 hours, liver hematoma remains stable clinically, she has been placed on Coumadin overlap with the goal of keeping INR 2-2.5 at low therapeutic range.   2.NSTEMI - and remains chest pain-free, likely troponin rise is due to right ventricular strain from acute PE which are large, Appreciate cardiology input, as received low-dose aspirin for 2 days we'll now discontinue as she is on heparin drip and there is small liver hematoma, low-dose Lopressor will be added. Troponin rise so far does not appear to be in ACS pattern.   3. Possible healthcare associated pneumonia. For now continue antibiotics as #1 above, CT angios above, off abx  4. Recent history of colon cancer, status post colon resection with colostomy placement, general surgery to follow, incidental finding of small liver hematoma-again surgery has been consulted, and discussed with general surgeon Dr. Darylene Price in detail on 10-2012, hematoma likely from liver biopsy which was done prior to her colon resection, okay for anticoagulation with cautious monitoring. She continues to have no right upper quadrant discomfort and clinically hematoma appears stable. Cleared by general surgery again on 10/11/2012 for continued anticoagulation.   5. UTI. Follow cultures, off abx   6. Mild hyponatremia. Will Ur Osm >>Sr Osm , fluid restritction and monitor.   7. Normocytic Normochromic Anemia. Likely anemia of chronic disease, stable anemia panel, go to  keep hemoglobin above 7, mild H&H fall is dilution due to IV antibiotics and IV fluids, all have been stopped, she has few days and will give gentle Lasix, will monitor H&H and a daily basis, she has been seen by general surgery today and cleared for continuation of anticoagulation.   8. Hypokalemia- repleat  9. hypomagnesium- repleat   Code Status: full Family Communication:  Disposition Plan: SNF   Consultants:  cardiology  Procedures:  none  Antibiotics:    HPI/Subjective: Patient says she feels bad all over No CP  Objective: Filed Vitals:   10/11/12 0630 10/11/12 1400 10/11/12 2134 10/12/12 0456  BP: 124/71 128/75 138/61 134/54  Pulse: 82 74 95 85  Temp: 97.6 F (36.4 C) 98.4 F (36.9 C) 99.1 F (37.3 C) 97.5 F (36.4 C)  TempSrc: Oral Oral Oral Oral  Resp: 20 18 18 18   Height:      Weight:      SpO2: 100% 98% 98% 98%    Intake/Output Summary (Last 24 hours) at 10/12/12 1129 Last data filed at 10/12/12 0930  Gross per 24 hour  Intake  992.1 ml  Output   2025 ml  Net -1032.9 ml   Filed Weights   10/08/12 2330 10/10/12 0624  Weight: 67.1 kg (147 lb 14.9 oz) 69.31 kg (152 lb 12.8 oz)    Exam:   General:  Ill appearing  Cardiovascular: rrr  Respiratory: clear, no wheezing  Abdomen: +BS, soft, NT  Data Reviewed: Basic Metabolic Panel:  Lab 10/12/12 8119 10/11/12 0442 10/10/12 0218 10/09/12 0300 10/08/12 1600  NA 140 136 132* 131* 135  K 2.9* 3.8 4.2 3.8 4.0  CL 106 107 100 99 101  CO2 24 20 20 21 26   GLUCOSE 121* 121* 149* 140* 140*  BUN 13 20 24* 24* 28*  CREATININE 0.75 0.76 0.90 1.02 1.23*  CALCIUM 8.2* 8.3* 8.6 8.1* 8.4  MG 1.3* -- -- -- --  PHOS -- -- -- -- --   Liver Function Tests:  Lab 10/08/12 1600 10/07/12 1655  AST 36 31  ALT 35 22  ALKPHOS 121* 146*  BILITOT 0.4 0.7  PROT 5.9* 6.7  ALBUMIN 2.6* 2.9*    Lab 10/08/12 2040 10/08/12 1600 10/07/12 1655  LIPASE -- 16 17  AMYLASE 55 -- --   No results found for this  basename: AMMONIA:5 in the last 168 hours CBC:  Lab 10/12/12 0435 10/11/12 0923 10/11/12 0442 10/10/12 0218 10/09/12 0300 10/08/12 1600 10/07/12 1655  WBC 10.6* -- 10.9* 12.8* 11.9* 11.0* --  NEUTROABS -- -- -- -- -- 8.2* 10.5*  HGB 8.0* 7.7* 7.6* 8.2* 8.1* -- --  HCT 24.4* 23.3* 23.1* 25.8* 24.9* -- --  MCV 87.5 -- 88.2 88.7 88.3 89.1 --  PLT 267 -- 217 233 201 238 --   Cardiac Enzymes:  Lab 10/09/12 1522 10/09/12 0939 10/09/12 0300 10/08/12 2051  CKTOTAL -- -- 29 37  CKMB -- -- 3.1 4.1*  CKMBINDEX -- -- -- --  TROPONINI 0.36* 0.70* 0.79* --   BNP (last 3 results)  Basename 10/09/12 0942  PROBNP 7675.0*   CBG: No results found for this basename: GLUCAP:5 in the last 168 hours  Recent Results (from the past 240 hour(s))  CULTURE, BLOOD (ROUTINE X 2)     Status: Normal (Preliminary result)   Collection Time   10/08/12 12:00 AM      Component Value Range Status Comment   Specimen Description BLOOD LEFT ARM   Final    Special Requests BOTTLES DRAWN AEROBIC ONLY 1CC   Final    Culture  Setup Time 10/09/2012 14:45   Final    Culture     Final    Value:        BLOOD CULTURE RECEIVED NO GROWTH TO DATE CULTURE WILL BE HELD FOR 5 DAYS BEFORE ISSUING A FINAL NEGATIVE REPORT   Report Status PENDING   Incomplete   CULTURE, BLOOD (ROUTINE X 2)     Status: Normal (Preliminary result)   Collection Time   10/08/12 12:00 AM      Component Value Range Status Comment   Specimen Description BLOOD LEFT HAND   Final    Special Requests BOTTLES DRAWN AEROBIC ONLY 10CC   Final    Culture  Setup Time 10/10/2012 15:32   Final    Culture     Final    Value:        BLOOD CULTURE RECEIVED NO GROWTH TO DATE CULTURE WILL BE HELD FOR 5 DAYS BEFORE ISSUING A FINAL NEGATIVE REPORT   Report Status PENDING   Incomplete   URINE CULTURE     Status: Normal   Collection Time   10/08/12  6:00 PM      Component Value Range Status Comment   Specimen Description URINE, CLEAN CATCH   Final    Special Requests NONE    Final    Culture  Setup Time 10/09/2012 15:12   Final    Colony Count NO GROWTH   Final    Culture NO GROWTH   Final    Report Status 10/10/2012 FINAL   Final   MRSA PCR SCREENING  Status: Normal   Collection Time   10/09/12  4:52 AM      Component Value Range Status Comment   MRSA by PCR NEGATIVE  NEGATIVE Final   CLOSTRIDIUM DIFFICILE BY PCR     Status: Normal   Collection Time   10/10/12  7:53 AM      Component Value Range Status Comment   C difficile by pcr NEGATIVE  NEGATIVE Final      Studies: No results found.  Scheduled Meds:   . antiseptic oral rinse  15 mL Mouth Rinse q12n4p  . chlorhexidine  15 mL Mouth Rinse BID  . erythromycin  1 application Both Eyes QHS  . feeding supplement  237 mL Oral BID BM  . ferrous sulfate  325 mg Oral QPC breakfast  . lactulose  40 g Oral QHS  . metoprolol tartrate  25 mg Oral BID  . pantoprazole (PROTONIX) IV  40 mg Intravenous QHS  . potassium chloride SA  20 mEq Oral BID  . potassium chloride  40 mEq Oral Q2H  . sertraline  75 mg Oral q morning - 10a  . sodium chloride  3 mL Intravenous Q12H  . sodium chloride  3 mL Intravenous Q12H  . Warfarin - Pharmacist Dosing Inpatient   Does not apply q1800   Continuous Infusions:   . heparin 1,300 Units/hr (10/12/12 0558)    Principal Problem:  *Bilateral pulmonary embolism Active Problems:  Healthcare-associated pneumonia  Non-ST elevation MI (NSTEMI)  Abnormal EKG  Liver hematoma  Pleuritic chest pain    Time spent: 25    [facility], Saleah Rishel  [facility] Pager [phone]. If 8PM-8AM, please contact night-coverage at [email or web address], password [facility] 10/12/2012, 11:29 AM  LOS: 4 days

## 2012-10-12 NOTE — Progress Notes (Signed)
Patient ID: Erin Hansen, female   DOB: 1925-12-10, 79 y.o.   MRN: [id number] Deconditioned, hct same, can continue anticoagulation, ostomy functional

## 2012-10-12 NOTE — Progress Notes (Addendum)
BRIEF PHARMACY UPDATE CONCERNING:  IV Heparin level  Pt on IV heparin/Coumadin for PE/DVT in setting of liver hematoma.  Therapeutic goal ranges for both heparin and coumadin are towards the LOW end of the range (Heparin: ~0.3-0.5, Coumadin 2.0-2.5)    Heparin rate has been slowly increased to bring into therapeutic range.    Heparin level this morning was therapeutic at 0.54, however to aim more towards low end, rate was decreased slightly from 1250 units/hr to 1200 units/hr  Heparin level 0.46<<0.30  No issues reported per RN  No change @ this time will f/u am labs  Erin Hansen 10/12/2012 12:27 AM    Addendum:    Am HL = 0.23, no problems per RN  Plan:  Increase Heparin drip to 1300 units/hr  Recheck in 8 hrs.  Erin Hansen 10/12/2012 5:55 AM

## 2012-10-12 NOTE — Care Management Note (Signed)
    Page 1 of 1   10/14/2012     1:00:58 PM   CARE MANAGEMENT NOTE 10/14/2012  Patient:  Erin Hansen, Erin Hansen   Account Number:  [id number]  Date Initiated:  10/12/2012  Documentation initiated by:  [facility]  Subjective/Objective Assessment:   ADMITTED W/SOB.BILAT PE.     Action/Plan:   FROM SNF.   Anticipated DC Date:  10/14/2012   Anticipated DC Plan:  SKILLED NURSING FACILITY      DC Planning Services  CM consult      Choice offered to / List presented to:             Status of service:  Completed, signed off Medicare Important Message given?   (If response is "NO", the following Medicare IM given date fields will be blank) Date Medicare IM given:   Date Additional Medicare IM given:    Discharge Disposition:  SKILLED NURSING FACILITY  Per UR Regulation:  Reviewed for med. necessity/level of care/duration of stay  If discussed at Long Length of Stay Meetings, dates discussed:   10/13/2012    Comments:  10/14/12 Seniyah Esker RN,BSN NCM [phone] D/C SNF.  10/12/12 Janmarie Smoot RN,BSN NCM [phone] PT/OT-SNF.HEPARIN @ 13U/HR,PROTONIX IV,MG RUN.D/C PLAN RETURN TO SNF WHEN MED STABLE.

## 2012-10-13 ENCOUNTER — Telehealth: Payer: Self-pay | Admitting: Oncology

## 2012-10-13 ENCOUNTER — Encounter (INDEPENDENT_AMBULATORY_CARE_PROVIDER_SITE_OTHER): Payer: Medicare Other | Admitting: General Surgery

## 2012-10-13 DIAGNOSIS — S36112A Contusion of liver, initial encounter: Secondary | ICD-10-CM

## 2012-10-13 LAB — CBC
HCT: 25.7 % — ABNORMAL LOW (ref 36.0–46.0)
MCH: 28.9 pg (ref 26.0–34.0)
MCV: 88.3 fL (ref 78.0–100.0)
Platelets: 247 10*3/uL (ref 150–400)
RBC: 2.91 MIL/uL — ABNORMAL LOW (ref 3.87–5.11)
WBC: 8.9 10*3/uL (ref 4.0–10.5)

## 2012-10-13 LAB — BASIC METABOLIC PANEL
CO2: 23 mEq/L (ref 19–32)
Calcium: 8.2 mg/dL — ABNORMAL LOW (ref 8.4–10.5)
Chloride: 108 mEq/L (ref 96–112)
Glucose, Bld: 116 mg/dL — ABNORMAL HIGH (ref 70–99)
Sodium: 141 mEq/L (ref 135–145)

## 2012-10-13 LAB — HEPARIN LEVEL (UNFRACTIONATED)
Heparin Unfractionated: 0.48 IU/mL (ref 0.30–0.70)
Heparin Unfractionated: 0.47 IU/mL (ref 0.30–0.70)

## 2012-10-13 LAB — PROTIME-INR: INR: 1.25 (ref 0.00–1.49)

## 2012-10-13 MED ORDER — HEPARIN (PORCINE) IN NACL 100-0.45 UNIT/ML-% IJ SOLN
1450.0000 [IU]/h | INTRAMUSCULAR | Status: AC
  Administered 2012-10-14: 1450 [IU]/h via INTRAVENOUS
  Filled 2012-10-13 (×3): qty 250

## 2012-10-13 MED ORDER — WARFARIN SODIUM 2.5 MG PO TABS
2.5000 mg | ORAL_TABLET | Freq: Once | ORAL | Status: AC
  Administered 2012-10-13: 2.5 mg via ORAL
  Filled 2012-10-13: qty 1

## 2012-10-13 NOTE — Progress Notes (Signed)
[facility] PROGRESS NOTE  Erin Hansen ZOX:[id number] DOB: 06-02-1926 DOA: 10/08/2012 PCP: Ginette Otto, MD  Assessment/Plan: 1. SOB due to Acute PE-DVT (R.Leg) and HCAP - clinically feels much better, white count has improved, patient is now feeling much better and is pain-free, after discussions with general surgery in the light of her liver hematoma due to a recent biopsy few weeks ago, he should be placed on heparin drip with caution and without bolus, plan is to continue heparin drip, has been placed on Coumadin overlap with the goal of keeping INR 2-2.5 at low therapeutic range.   2.NSTEMI - and remains chest pain-free, likely troponin rise is due to right ventricular strain from acute PE which are large, Appreciate cardiology input, as received low-dose aspirin for 2 days we'll now discontinue as she is on heparin drip and there is small liver hematoma, low-dose Lopressor will be added. Troponin rise so far does not appear to be in ACS pattern.   3. Possible healthcare associated pneumonia.  CT angios above, off abx  4. Recent history of colon cancer, status post colon resection with colostomy placement, general surgery to follow, incidental finding of small liver hematoma-again surgery has been consulted, and discussed with general surgeon Dr. Darylene Price in detail on 10-2012, hematoma likely from liver biopsy which was done prior to her colon resection, okay for anticoagulation with cautious monitoring. She continues to have no right upper quadrant discomfort and clinically hematoma appears stable. Cleared by general surgery again on 10/11/2012 for continued anticoagulation.   5. UTI.  Cultures ngtd, off abx   6. Mild hyponatremia. Will Ur Osm >>Sr Osm , fluid restriction-- Na increasing will increase fliud to 1.5L  7. Normocytic Normochromic Anemia. Likely anemia of chronic disease, stable anemia panel, go to keep hemoglobin above 7, mild H&H fall is dilution due to IV antibiotics  and IV fluids, all have been stopped, she has few days and will give gentle Lasix, will monitor H&H and a daily basis, she has been seen by general surgery today and cleared for continuation of anticoagulation.   8. Hypokalemia- repleat  9. hypomagnesium- repleat   Code Status: full Family Communication:  Disposition Plan: SNF   Consultants:  cardiology  Procedures:  none  Antibiotics:    HPI/Subjective: Up in chair, feeling better Appetite better  Objective: Filed Vitals:   10/12/12 0456 10/12/12 1337 10/12/12 2100 10/13/12 0456  BP: 134/54 123/52 120/48 126/53  Pulse: 85 68 76 66  Temp: 97.5 F (36.4 C) 98.1 F (36.7 C) 98.1 F (36.7 C) 98.6 F (37 C)  TempSrc: Oral Oral Oral Oral  Resp: 18 18 18 18   Height:      Weight:      SpO2: 98% 98% 100% 99%    Intake/Output Summary (Last 24 hours) at 10/13/12 1201 Last data filed at 10/13/12 0800  Gross per 24 hour  Intake    699 ml  Output    925 ml  Net   -226 ml   Filed Weights   10/08/12 2330 10/10/12 0624  Weight: 67.1 kg (147 lb 14.9 oz) 69.31 kg (152 lb 12.8 oz)    Exam:   General:  Pleasant/cooperative  Cardiovascular: rrr  Respiratory: clear, no wheezing  Abdomen: +BS, soft, NT  Data Reviewed: Basic Metabolic Panel:  Lab 10/13/12 8119 10/12/12 0435 10/11/12 0442 10/10/12 0218 10/09/12 0300  NA 141 140 136 132* 131*  K 3.8 2.9* 3.8 4.2 3.8  CL 108 106 107 100 99  CO2 23 24 20 20 21   GLUCOSE 116* 121* 121* 149* 140*  BUN 13 13 20  24* 24*  CREATININE 0.62 0.75 0.76 0.90 1.02  CALCIUM 8.2* 8.2* 8.3* 8.6 8.1*  MG -- 1.3* -- -- --  PHOS -- -- -- -- --   Liver Function Tests:  Lab 10/08/12 1600 10/07/12 1655  AST 36 31  ALT 35 22  ALKPHOS 121* 146*  BILITOT 0.4 0.7  PROT 5.9* 6.7  ALBUMIN 2.6* 2.9*    Lab 10/08/12 2040 10/08/12 1600 10/07/12 1655  LIPASE -- 16 17  AMYLASE 55 -- --   No results found for this basename: AMMONIA:5 in the last 168 hours CBC:  Lab 10/13/12  0508 10/12/12 0435 10/11/12 0923 10/11/12 0442 10/10/12 0218 10/09/12 0300 10/08/12 1600 10/07/12 1655  WBC 8.9 10.6* -- 10.9* 12.8* 11.9* -- --  NEUTROABS -- -- -- -- -- -- 8.2* 10.5*  HGB 8.4* 8.0* 7.7* 7.6* 8.2* -- -- --  HCT 25.7* 24.4* 23.3* 23.1* 25.8* -- -- --  MCV 88.3 87.5 -- 88.2 88.7 88.3 -- --  PLT 247 267 -- 217 233 201 -- --   Cardiac Enzymes:  Lab 10/09/12 1522 10/09/12 0939 10/09/12 0300 10/08/12 2051  CKTOTAL -- -- 29 37  CKMB -- -- 3.1 4.1*  CKMBINDEX -- -- -- --  TROPONINI 0.36* 0.70* 0.79* --   BNP (last 3 results)  Basename 10/09/12 0942  PROBNP 7675.0*   CBG: No results found for this basename: GLUCAP:5 in the last 168 hours  Recent Results (from the past 240 hour(s))  CULTURE, BLOOD (ROUTINE X 2)     Status: Normal (Preliminary result)   Collection Time   10/08/12 12:00 AM      Component Value Range Status Comment   Specimen Description BLOOD LEFT ARM   Final    Special Requests BOTTLES DRAWN AEROBIC ONLY 1CC   Final    Culture  Setup Time 10/09/2012 14:45   Final    Culture     Final    Value:        BLOOD CULTURE RECEIVED NO GROWTH TO DATE CULTURE WILL BE HELD FOR 5 DAYS BEFORE ISSUING A FINAL NEGATIVE REPORT   Report Status PENDING   Incomplete   CULTURE, BLOOD (ROUTINE X 2)     Status: Normal (Preliminary result)   Collection Time   10/08/12 12:00 AM      Component Value Range Status Comment   Specimen Description BLOOD LEFT HAND   Final    Special Requests BOTTLES DRAWN AEROBIC ONLY 10CC   Final    Culture  Setup Time 10/10/2012 15:32   Final    Culture     Final    Value:        BLOOD CULTURE RECEIVED NO GROWTH TO DATE CULTURE WILL BE HELD FOR 5 DAYS BEFORE ISSUING A FINAL NEGATIVE REPORT   Report Status PENDING   Incomplete   URINE CULTURE     Status: Normal   Collection Time   10/08/12  6:00 PM      Component Value Range Status Comment   Specimen Description URINE, CLEAN CATCH   Final    Special Requests NONE   Final    Culture  Setup Time  10/09/2012 15:12   Final    Colony Count NO GROWTH   Final    Culture NO GROWTH   Final    Report Status 10/10/2012 FINAL   Final   MRSA PCR SCREENING  Status: Normal   Collection Time   10/09/12  4:52 AM      Component Value Range Status Comment   MRSA by PCR NEGATIVE  NEGATIVE Final   CLOSTRIDIUM DIFFICILE BY PCR     Status: Normal   Collection Time   10/10/12  7:53 AM      Component Value Range Status Comment   C difficile by pcr NEGATIVE  NEGATIVE Final      Studies: No results found.  Scheduled Meds:    . antiseptic oral rinse  15 mL Mouth Rinse q12n4p  . chlorhexidine  15 mL Mouth Rinse BID  . erythromycin  1 application Both Eyes QHS  . feeding supplement  237 mL Oral BID BM  . ferrous sulfate  325 mg Oral QPC breakfast  . lactulose  40 g Oral QHS  . metoprolol tartrate  25 mg Oral BID  . pantoprazole (PROTONIX) IV  40 mg Intravenous QHS  . potassium chloride SA  20 mEq Oral BID  . sertraline  75 mg Oral q morning - 10a  . sodium chloride  3 mL Intravenous Q12H  . sodium chloride  3 mL Intravenous Q12H  . warfarin  2.5 mg Oral ONCE-1800  . Warfarin - Pharmacist Dosing Inpatient   Does not apply q1800   Continuous Infusions:    . heparin 1,450 Units/hr (10/13/12 1610)    Principal Problem:  *Bilateral pulmonary embolism Active Problems:  Healthcare-associated pneumonia  Non-ST elevation MI (NSTEMI)  Abnormal EKG  Liver hematoma  Pleuritic chest pain    Time spent: 25    [facility], Floetta Brickey  [facility] Pager [phone]. If 8PM-8AM, please contact night-coverage at [email or web address], password [facility] 10/13/2012, 12:01 PM  LOS: 5 days

## 2012-10-13 NOTE — Progress Notes (Signed)
PHARMACY BRIEF NOTE - Drug Level Result  Consult:  IV Heparin Indication:  DVT / PE  With infusion of 1450 units/hr, the heparin level drawn approximately  8 hours after the last rate change is reported as 0.48 units/ml.  This level is within the therapeutic range (0.3-0.7 units/ml).  Plan:  Repeat level in approximately 6 hours to confirm.   Using conservative approach to anticoagulation in view of patient's advanced age, low hemoglobin, and liver hematoma.  Polo Riley R.Ph. 10/13/2012 4:31 PM

## 2012-10-13 NOTE — Progress Notes (Signed)
ANTICOAGULATION CONSULT NOTE - Follow Up Consult  Pharmacy Consult for Heparin Indication: pulmonary embolus and DVT  Allergies  Allergen Reactions  . Codeine Nausea Only    Patient Measurements: Height: 5\' 9"  (175.3 cm) Weight: 152 lb 12.8 oz (69.31 kg) IBW/kg (Calculated) : 66.2  Heparin Dosing Weight:   Vital Signs: Temp: 98.6 F (37 C) (02/06 0456) Temp src: Oral (02/06 0456) BP: 126/53 mmHg (02/06 0456) Pulse Rate: 66  (02/06 0456)  Labs:  Basename 10/13/12 0508 10/12/12 2204 10/12/12 1351 10/12/12 0435 10/11/12 0923 10/11/12 0442  HGB 8.4* -- -- 8.0* -- --  HCT 25.7* -- -- 24.4* 23.3* --  PLT 247 -- -- 267 -- 217  APTT -- -- -- -- -- --  LABPROT 15.5* -- -- 16.2* -- --  INR 1.25 -- -- 1.33 -- --  HEPARINUNFRC 0.16* 0.37 0.37 -- -- --  CREATININE 0.62 -- -- 0.75 -- 0.76  CKTOTAL -- -- -- -- -- --  CKMB -- -- -- -- -- --  TROPONINI -- -- -- -- -- --    Estimated Creatinine Clearance: 53.7 ml/min (by C-G formula based on Cr of 0.62).   Medications:  Infusions:    . heparin    . [DISCONTINUED] heparin 1,300 Units/hr (10/13/12 0600)    Assessment: Patient with PM level at goal, however this am heparin level is low.  No issues with drip per RN.  Goal of Therapy:  Heparin level 0.3-0.7 units/ml aim for low end tx range Monitor platelets by anticoagulation protocol: Yes   Plan:  Increase heparin to 1450 units/hr Recheck level at [address] [place] 10/13/2012,6:19 AM

## 2012-10-13 NOTE — Progress Notes (Signed)
Physical Therapy Treatment Patient Details Name: Erin Hansen MRN: [id number] DOB: [birth year] Today's Date: 10/13/2012 Time: 4098-1191 PT Time Calculation (min): 26 min  PT Assessment / Plan / Recommendation Comments on Treatment Session  Pt didn't remember walking with me yesterday and stated "I know I can't remember things".  Assisted pt OOB to amb in hallway.  Tolerated increased distance but still demon limited activity and required a sitting rest break after 65 feet.     Follow Up Recommendations  SNF     Does the patient have the potential to tolerate intense rehabilitation     Barriers to Discharge        Equipment Recommendations  None recommended by PT    Recommendations for Other Services    Frequency Min 3X/week   Plan Discharge plan remains appropriate    Precautions / Restrictions Precautions Precautions: Fall Precaution Comments: recent colostomy with ostomy bag  Restrictions Weight Bearing Restrictions: No   Pertinent Vitals/Pain No c/o pain    Mobility  Bed Mobility Bed Mobility: Supine to Sit;Sitting - Scoot to Edge of Bed Supine to Sit: 4: Min assist;3: Mod assist Sitting - Scoot to [place] of Bed: 4: Min assist;3: Mod assist Details for Bed Mobility Assistance: 50% VC's  with increased time and use of pad to swival hips around to get pt to EOB  Transfers Transfers: Sit to Stand;Stand to Sit Sit to Stand: 4: Min assist;3: Mod assist Stand to Sit: 3: Mod assist;4: Min assist Details for Transfer Assistance: increased time and 50% VC's on safety and hand placement  Ambulation/Gait Ambulation/Gait Assistance: 4: Min assist;3: Mod assist Ambulation Distance (Feet): 75 Feet (one sitting rest break) Assistive device: Rolling walker Ambulation/Gait Assistance Details: 50% VC's on proper walker to self distance and upright posture as pt c/o MAX fatigue. Gait Pattern: Step-through pattern;Decreased stride length;Shuffle;Trunk flexed Gait velocity: decreased      PT Goals                                            progressing    Visit Information  Last PT Received On: 10/13/12 Assistance Needed: +1    Subjective Data  Subjective: I don't remember you from yesterday Patient Stated Goal: to return [facility]    Cognition    impaired memory   Balance   fair to poor depending on level of fatigue  End of Session PT - End of Session Equipment Utilized During Treatment: Gait belt Activity Tolerance: Patient limited by fatigue Patient left: in chair;with call bell/phone within reach   [employer]  PTA [facility]  Acute  Rehab Pager      [phone]

## 2012-10-13 NOTE — Telephone Encounter (Signed)
Pt daughter called cancelled appt , pt in hospital,nurse notified

## 2012-10-13 NOTE — Progress Notes (Signed)
ANTICOAGULATION CONSULT NOTE - Follow Up Consult  Pharmacy Consult for Warfarin (plus Heparin) Indication: DVT/PE  Allergies  Allergen Reactions  . Codeine Nausea Only    Patient Measurements: Height: 5\' 9"  (175.3 cm) Weight: 152 lb 12.8 oz (69.31 kg) IBW/kg (Calculated) : 66.2  Heparin Dosing Weight: 69.3kg  Labs:  Basename 10/13/12 0508 10/12/12 2204 10/12/12 1351 10/12/12 0435 10/11/12 0923 10/11/12 0442  HGB 8.4* -- -- 8.0* -- --  HCT 25.7* -- -- 24.4* 23.3* --  PLT 247 -- -- 267 -- 217  APTT -- -- -- -- -- --  LABPROT 15.5* -- -- 16.2* -- --  INR 1.25 -- -- 1.33 -- --  HEPARINUNFRC 0.16* 0.37 0.37 -- -- --  CREATININE 0.62 -- -- 0.75 -- 0.76  CKTOTAL -- -- -- -- -- --  CKMB -- -- -- -- -- --  TROPONINI -- -- -- -- -- --    Estimated Creatinine Clearance: 53.7 ml/min (by C-G formula based on Cr of 0.62).   Assessment: 87 YOF on IV heparin/Coumadin bridge for DVT/PE.  Both heparin and coumadin have low therapeutic ranges given subcapsular hematoma in R lobe of liver.  IV heparin currently running at 1450 units/hr - next heparin level scheduled for 15:00 today - Pharmacy will f/u when HL available. INR remains subtherapeutic as expected after only 2 doses of warfarin (average time to therapeutic INR is 5-7 days of warfarin) Using conservative warfarin dosing due to patient's advanced age, low hgb, and liver hematoma Will continue to use lower warfarin dosing tonight, and f/u INR in am - if still no INR response, will increase warfarin dose for tomorrow night (again, using very conservative dosing with liver hematoma). No bleeding events reported in chart or per RN Today is Day # 3 of 5 Day minimum overlap with warfarin and heparin for acute VTE.  Goal of Therapy:  Heparin level 0.3-0.7 units/ml --aim for low end tx range INR 2- 2.5 -low end tx range Monitor platelets by anticoagulation protocol: Yes   Plan:  1.  Coumadin 2.5mg  po x 1 tonight 2.  Daily PT/INR 3.   2nd shift Pharmacist will f/u with heparin level scheduled for 15:00 today.  Darrol Angel, PharmD Pager: [phone] 10/13/2012 11:08 AM

## 2012-10-14 ENCOUNTER — Ambulatory Visit: Payer: Medicare Other | Admitting: Oncology

## 2012-10-14 LAB — CBC
HCT: 27 % — ABNORMAL LOW (ref 36.0–46.0)
Hemoglobin: 8.8 g/dL — ABNORMAL LOW (ref 12.0–15.0)
MCHC: 32.6 g/dL (ref 30.0–36.0)
RBC: 3.1 MIL/uL — ABNORMAL LOW (ref 3.87–5.11)
WBC: 9.8 10*3/uL (ref 4.0–10.5)

## 2012-10-14 LAB — BASIC METABOLIC PANEL
Chloride: 108 mEq/L (ref 96–112)
GFR calc Af Amer: >90 mL/min (ref >90)
GFR calc non Af Amer: 82 mL/min — ABNORMAL LOW (ref >90)
Potassium: 3.5 mEq/L (ref 3.5–5.1)
Sodium: 140 mEq/L (ref 135–145)

## 2012-10-14 LAB — HEPARIN LEVEL (UNFRACTIONATED): Heparin Unfractionated: 0.48 IU/mL (ref 0.30–0.70)

## 2012-10-14 LAB — PROTIME-INR
INR: 1.42 (ref 0.00–1.49)
Prothrombin Time: 17 seconds — ABNORMAL HIGH (ref 11.6–15.2)

## 2012-10-14 MED ORDER — ENOXAPARIN SODIUM 80 MG/0.8ML ~~LOC~~ SOLN
1.0000 mg/kg | Freq: Once | SUBCUTANEOUS | Status: AC
  Administered 2012-10-14: 70 mg via SUBCUTANEOUS
  Filled 2012-10-14: qty 0.8

## 2012-10-14 MED ORDER — ENOXAPARIN SODIUM 80 MG/0.8ML ~~LOC~~ SOLN: 1.0000 mg/kg | Freq: Two times a day (BID) | SUBCUTANEOUS | Status: DC

## 2012-10-14 MED ORDER — ALBUTEROL SULFATE (5 MG/ML) 0.5% IN NEBU: 2.5000 mg | INHALATION_SOLUTION | Freq: Four times a day (QID) | RESPIRATORY_TRACT | Status: DC | PRN

## 2012-10-14 MED ORDER — ENSURE COMPLETE PO LIQD: 237.0000 mL | Freq: Two times a day (BID) | ORAL | Status: DC

## 2012-10-14 MED ORDER — WARFARIN SODIUM 2.5 MG PO TABS: 2.5000 mg | ORAL_TABLET | Freq: Every day | ORAL | Status: DC

## 2012-10-14 MED ORDER — METOPROLOL TARTRATE 25 MG PO TABS: 25.0000 mg | ORAL_TABLET | Freq: Two times a day (BID) | ORAL | Status: DC

## 2012-10-14 MED ORDER — HYDROCODONE-ACETAMINOPHEN 5-325 MG PO TABS: 1.0000 | ORAL_TABLET | Freq: Four times a day (QID) | ORAL | Status: DC | PRN

## 2012-10-14 MED ORDER — ENOXAPARIN SODIUM 80 MG/0.8ML ~~LOC~~ SOLN
1.0000 mg/kg | Freq: Two times a day (BID) | SUBCUTANEOUS | Status: DC
  Filled 2012-10-14: qty 0.8

## 2012-10-14 MED ORDER — WARFARIN SODIUM 2.5 MG PO TABS
2.5000 mg | ORAL_TABLET | Freq: Every day | ORAL | Status: DC
  Filled 2012-10-14: qty 1

## 2012-10-14 NOTE — Discharge Summary (Signed)
Physician Discharge Summary  Erin Hansen:[id number] DOB: Dec 14, 1925 DOA: 10/08/2012  PCP: Ginette Otto, MD  Admit date: 10/08/2012 Discharge date: 10/14/2012  Time spent: 35 minutes  Recommendations for Outpatient Follow-up:  1. Pt/INR daily 2. CBC, BMP 1 week 3. Overlap lovenox/coumadin until INR 2-2.5 x 2 days  Discharge Diagnoses:  Principal Problem:  *Bilateral pulmonary embolism Active Problems:  Healthcare-associated pneumonia  Non-ST elevation MI (NSTEMI)  Abnormal EKG  Liver hematoma  Pleuritic chest pain   Discharge Condition: improved  Diet recommendation: cardiac  Filed Weights   10/08/12 2330 10/10/12 0624  Weight: 67.1 kg (147 lb 14.9 oz) 69.31 kg (152 lb 12.8 oz)    History of present illness:  Erin Hansen is a 79 y.o. female, with no history of cardiac disease, presenting today with 2 day history of shortness of breath, nausea and vomiting. Patient denies chest pains, cold sweats. Her history started yesterday in the morning when she started becoming weak and had presyncopal episodes, her heart rate was noted to be elevated. Patient was brought to the emergency room and she complained of shortness of breath, her chest x-ray was negative and she was sent back to her SNF. Today the shortness of breath increased, and a chest x-ray done at the nursing home showed a probable pneumonia which was confirmed here. The patient has a history of colon cancer stage III status post colostomy but no chemotherapy. Her colostomy was done on the 14th of last month and she was sent to the SNF to recuperate. No reports of abdominal pain but they noted that the output from her colostomy was increased and probably diarrhea.   Hospital Course:  1. SOB due to Acute PE-DVT (R.Leg) and HCAP - clinically feels much better, white count has improved, patient is now feeling much better and is pain-free, after discussions with general surgery in the light of her liver hematoma due to a  recent biopsy few weeks ago, was placed on heparin drip did well with this so lovenox was started with overlap to coumadin, h with the goal of keeping INR 2-2.5 at low therapeutic range.   2.NSTEMI - and remains chest pain-free, likely troponin rise is due to right ventricular strain from acute PE which are large, Appreciate cardiology input, as received low-dose aspirin for 2 days we'll now discontinue as she is on heparin drip and there is small liver hematoma, low-dose Lopressor will be added. Troponin rise so far does not appear to be in ACS pattern.   3. Possible healthcare associated pneumonia.  off abx   4. Recent history of colon cancer, status post colon resection with colostomy placement, general surgery to follow, incidental finding of small liver hematoma-again surgery has been consulted, and discussed with general surgeon Dr. Darylene Price in detail on 10-2012, hematoma likely from liver biopsy which was done prior to her colon resection, okay for anticoagulation with cautious monitoring. She continues to have no right upper quadrant discomfort and clinically hematoma appears stable. Cleared by general surgery again on 10/11/2012 for continued anticoagulation.   5. UTI. Cultures ngtd, off abx   6. Mild hyponatremia. Remove fluid restriction trend Na with outpatient labs  7. Normocytic Normochromic Anemia. Likely anemia of chronic disease, stable anemia panel, go to keep hemoglobin above 7, mild H&H fall is dilution due to IV antibiotics and IV fluids, all have been stopped, she has few days and will give gentle Lasix, will monitor H&H and a daily basis, she has been seen by general  surgery today and cleared for continuation of anticoagulation.   8. Hypokalemia- repleat   9. hypomagnesium- repleat   Procedures:  none  Consultations:  Surgery  cardiology  Discharge Exam: Filed Vitals:   10/13/12 0456 10/13/12 1320 10/13/12 2105 10/14/12 0459  BP: 126/53 128/52 135/60 133/55   Pulse: 66 76 78 74  Temp: 98.6 F (37 C) 97.4 F (36.3 C) 97.8 F (36.6 C) 98.4 F (36.9 C)  TempSrc: Oral Oral Oral Oral  Resp: 18 16 16 16   Height:      Weight:      SpO2: 99% 97% 96% 96%    General: pleasant/cooperative- anxious to get back home Cardiovascular: rrr Respiratory: clear ant  Discharge Instructions      Discharge Orders    Future Appointments: Provider: Department: Dept Phone: Center:   10/14/2012 3:00 PM Ladene Artist, MD [facility] [phone] None     Future Orders Please Complete By Expires   Diet - low sodium heart healthy      Increase activity slowly      Discharge instructions      Comments:   Coumadin 2.5 mg daily PT/INR daily Continue lovenox until INR 2.0-2.5 x 2 days       Medication List     As of 10/14/2012 11:03 AM    STOP taking these medications         cholecalciferol 1000 UNITS tablet   Commonly known as: VITAMIN D      fish oil-omega-3 fatty acids 1000 MG capsule      hydrochlorothiazide 25 MG tablet   Commonly known as: HYDRODIURIL      oxyCODONE-acetaminophen 5-325 MG per tablet   Commonly known as: PERCOCET/ROXICET      psyllium 58.6 % powder   Commonly known as: METAMUCIL      TAKE these medications         acetaminophen 500 MG tablet   Commonly known as: TYLENOL   Take 1,000 mg by mouth every 4 (four) hours as needed. For pain      albuterol (5 MG/ML) 0.5% nebulizer solution   Commonly known as: PROVENTIL   Take 0.5 mLs (2.5 mg total) by nebulization every 6 (six) hours as needed for wheezing or shortness of breath.      docusate sodium 100 MG capsule   Commonly known as: COLACE   Take 400 mg by mouth at bedtime as needed. For constipation      enoxaparin 80 MG/0.8ML injection   Commonly known as: LOVENOX   Inject 0.7 mLs (70 mg total) into the skin every 12 (twelve) hours.      erythromycin ophthalmic ointment   Place 1 application into both eyes at bedtime.       feeding supplement Liqd   Take 237 mLs by mouth 2 (two) times daily between meals.      ferrous sulfate 325 (65 FE) MG tablet   Take 325 mg by mouth daily with breakfast.      HYDROcodone-acetaminophen 5-325 MG per tablet   Commonly known as: NORCO/VICODIN   Take 1 tablet by mouth every 6 (six) hours as needed.      lactulose 10 GM/15ML solution   Commonly known as: CHRONULAC   Take 40 g by mouth at bedtime.      Melatonin 5 MG Tabs   Take 5 mg by mouth at bedtime as needed. For sleep      metoprolol tartrate 25 MG tablet  Commonly known as: LOPRESSOR   Take 1 tablet (25 mg total) by mouth 2 (two) times daily.      ondansetron 4 MG tablet   Commonly known as: ZOFRAN   Take 1 tablet (4 mg total) by mouth every 6 (six) hours.      potassium chloride SA 20 MEQ tablet   Commonly known as: K-DUR,KLOR-CON   Take 20 mEq by mouth 2 (two) times daily.      sertraline 50 MG tablet   Commonly known as: ZOLOFT   Take 75 mg by mouth every morning.      warfarin 2.5 MG tablet   Commonly known as: COUMADIN   Take 1 tablet (2.5 mg total) by mouth daily.        Follow-up Information    Follow up with [facility], MD. In 2 weeks.   Contact information:   [address] [place] [address] [phone]       Follow up with Ginette Otto, MD. In 2 weeks.   Contact information:   [address] [place] [address] [phone]           The results of significant diagnostics from this hospitalization (including imaging, microbiology, ancillary and laboratory) are listed below for reference.    Significant Diagnostic Studies: Dg Chest 2 View  09/15/2012  *RADIOLOGY REPORT*  Clinical Data: History of colon carcinoma; preoperative colectomy  CHEST - 2 VIEW  Comparison: Chest radiograph September 10, 2011; chest CT August 26, 2012  Findings:  There is mild scarring in the left lung base.  The lungs are otherwise clear.  Heart size and  pulmonary vascularity are within normal limits.  There is a hiatal hernia. No adenopathy.  There is marked collapse of the T12 vertebral body, a stable finding.  There is localized kyphosis in the area of T12.  IMPRESSION:   Lungs are clear except for stable left base scarring. No adenopathy.  Stable marked collapse of the T12 vertebral body.   Original Report Authenticated By: Bretta Bang, M.D.    Ct Angio Chest Pe W/cm &/or Wo Cm  10/09/2012  *RADIOLOGY REPORT*  Clinical Data: Shortness of breath, nausea and vomiting.  Right- sided chest pain.  CT ANGIOGRAPHY CHEST  Technique:  Multidetector CT imaging of the chest using the standard protocol during bolus administration of intravenous contrast. Multiplanar reconstructed images including MIPs were obtained and reviewed to evaluate the vascular anatomy.  Contrast: OMNIPAQUE IOHEXOL 350 MG/ML SOLN  Comparison: 08/26/2012  Findings: Technically adequate study with good opacification of the central and segmental pulmonary arteries.  Multiple filling defects are demonstrated in the right main pulmonary artery and multiple upper and lower lobe segmental and subsegmental branches bilaterally.  The examination is positive for pulmonary embolus. Mild right heart dilatation may represent some degree of straining.  Cardiac enlargement.  Normal caliber thoracic aorta with calcification.  No significant lymphadenopathy in the chest.  The esophagus is decompressed.  Visualized portions of the upper abdominal organs demonstrate calcified granulomas in the spleen and a subcapsular hematoma in the right liver as previously discussed on CT abdomen and pelvis from 10/08/2012.  Moderate sized esophageal hiatal hernia.  Small bilateral pleural effusions greater on the right side.  There is airspace infiltration in both lung bases suggesting pneumonia.  Mild diffuse emphysematous changes bilaterally.  Bronchial wall thickening consistent with chronic bronchitis.  Airways  appear patent.  No pneumothorax. Normal alignment of the thoracic vertebrae.  No  compression deformities demonstrated.  Note that the T12 vertebra seen to decompressed on prior images is not included on this study.  No destructive bone lesions appreciated.  IMPRESSION: Positive study for bilateral pulmonary emboli including central right pulmonary embolus.  Small bilateral pleural effusions with airspace disease in both lung bases.  Critical Value/emergent results were called by telephone at the time of interpretation on 10/09/2012 at 2219 hours to Debbie, the patient's nurse, who verbally acknowledged these results.   Original Report Authenticated By: Burman Nieves, M.D.    Mr Brain Wo Contrast  10/09/2012  *RADIOLOGY REPORT*  Clinical Data: Presyncopal episodes.  Mild dysarthria and confusion.  The examination had to be discontinued prior to completion due to pain.  MRI HEAD WITHOUT CONTRAST  Technique:  Multiplanar, multiecho pulse sequences of the brain and surrounding structures were obtained according to standard protocol without intravenous contrast.  Comparison: None.  Findings: No acute infarct, hemorrhage, or mass lesion is present. Moderate generalized atrophy is present.  Periventricular and scattered subcortical T2 hyperintensities are advanced for age as well.  The flow is present in the major intracranial arteries.  The globes and orbits are intact.  The paranasal sinuses are clear. There is some fluid in the left mastoid air cells.  No obstructing nasopharyngeal lesion is evident.  IMPRESSION:  1.  No acute intracranial abnormality. 2.  Moderate atrophy white matter disease.  This likely reflects the sequelae of chronic microvascular ischemia.   Original Report Authenticated By: Marin Roberts, M.D.    US Abdomen Complete  10/09/2012  *RADIOLOGY REPORT*  Clinical Data:  Right upper quadrant pain  COMPLETE ABDOMINAL ULTRASOUND  Comparison:  CT abdomen pelvis dated 10/08/2012.  Biopsy  ultrasound dated 09/14/2012.  Findings:  Gallbladder:  Gallbladder sludge.  No gallstones, gallbladder wall thickening, or pericholecystic fluid.  Negative sonographic Murphy's sign.  Common bile duct:  Measures 4 mm.  Liver:  7.8 x 3.7 x 6.7 cm complex subcapsular fluid collection/hematoma overlying the right hepatic lobe.  1.6 x 1.7 x 1.7 cm hyperechoic lesion in the central right hepatic lobe, unchanged from prior biopsy ultrasound.  IVC:  Appears normal.  Pancreas:  Incompletely visualized but grossly unremarkable.  Spleen:  Measures 7.4 cm.  Calcified splenic granulomata.  Right Kidney:  Measures 11.2 cm.  No mass or hydronephrosis.  Left Kidney:  Measures 10.6 cm.  No mass or hydronephrosis.  Abdominal aorta:  No aneurysm identified.  IMPRESSION: 7.8 cm complex subcapsular fluid collection/hematoma overlying the right hepatic lobe.  1.7 cm hyperechoic lesion in the central right hepatic lobe, unchanged from prior biopsy ultrasound.   Original Report Authenticated By: Charline Bills, M.D.    Ct Abdomen Pelvis W Contrast  10/08/2012  *RADIOLOGY REPORT*  Clinical Data: Right-sided abdominal pain.  Post colostomy 01/14.  CT ABDOMEN AND PELVIS WITH CONTRAST  Technique:  Multidetector CT imaging of the abdomen and pelvis was performed following the standard protocol during bolus administration of intravenous contrast.  Contrast: OMNIPAQUE IOHEXOL 300 MG/ML  SOLN  Comparison: 08/26/2012  Findings: Airspace infiltration in both lung bases, greater on the right, suggesting pneumonia.  Minimal bilateral pleural effusions. Cardiac enlargement.  Moderate sized esophageal hiatal hernia.  Since the previous study, there is interval development of a subcapsular hematoma in the right lobe of the liver.  This measures about 3.5 x 8.4 cm.  This is adjacent to the previously demonstrated focal liver lesion.  Perhaps there has been interval biopsy of this lesion leading to the hematoma.  If there has been no  intervention, then hemorrhage arising from the lesion could have this appearance.  Calcified granulomas in the spleen.  The gallbladder, pancreas, adrenal glands, kidneys, and retroperitoneal lymph nodes are unremarkable.  Calcification of the abdominal aorta without aneurysm.  There have been interval postoperative changes with placement of a left upper quadrant ostomy which appears to connect to the descending colon.  The stomach, small bowel, and colon are not abnormally distended.  No free air or free fluid in the abdomen.  Pelvis:  The bladder wall is not thickened.  The uterus appears to be surgically absent.  The rectosigmoid colon is decompressed and the previously identified mass appears to been resected.  No free or loculated pelvic fluid collections.  No significant pelvic lymphadenopathy.  The appendix is not identified.  Scoliosis and degenerative changes in the lumbar spine.  Degenerative changes in the hips.  Compression and kyphoplasty changes at T12. Spondylolysis and mild spondylolisthesis at L5-S1.  IMPRESSION: Interval development of a right hepatic subcapsular hematoma which could arisen from biopsy of the right hepatic lesion or from rupture of this lesion.  Correlation with surgical history is recommended.  Interval postoperative changes with resection of a rectosigmoid mass lesion and placement of the descending colostomy. Infiltrates in both lung bases suggesting pneumonia.   Original Report Authenticated By: Burman Nieves, M.D.    US Biopsy  09/14/2012  *RADIOLOGY REPORT*  Clinical Data: Recent diagnosis of carcinoma of the rectosigmoid colon.  Ill-defined liver lesion was visualized by CT in the right lobe of the liver and the patient presents for liver biopsy.  ULTRASOUND GUIDED CORE BIOPSY OF LIVER  Sedation:  1.0 mg IV Versed;  50 mcg IV Fentanyl  Total Moderate Sedation Time: 18 minutes.  Procedure:  The procedure, risks, benefits, and alternatives were explained to the patient.   Questions regarding the procedure were encouraged and answered.  The patient understands and consents to the procedure.  The abdominal wall was prepped with Betadine in a sterile fashion, and a sterile drape was applied covering the operative field.  A sterile gown and sterile gloves were used for the procedure. Local anesthesia was provided with 1% Lidocaine.  Preliminary ultrasound was performed of the liver.  After localizing a lesion in the right lobe, a 17 gauge needle was advanced under direct ultrasound guidance to the level of the lesion.  Coaxial 18-gauge core biopsy samples were obtained.  Three core biopsy samples were submitted in formalin.  The outer needle was removed and additional ultrasound performed.  Complications: None  Findings: A relatively hyperechoic lesion is visualized in the right lobe of the liver in a location corresponding to the abnormality detected by CT.  This lesion measures approximately 1.6 cm in greatest diameter by ultrasound.  Solid tissue was obtained from the region of the lesion with core biopsy.  IMPRESSION: Ultrasound guided core biopsy performed of a lesion in the right lobe of the liver.  This lesion corresponds in location to the lesion depicted by CT and measures approximately 1.6 cm by ultrasound.   Original Report Authenticated By: Irish Lack, M.D.    Dg Abd Acute W/chest  10/08/2012  *RADIOLOGY REPORT*  Clinical Data: Abdominal pain, nausea/vomiting/diarrhea  ACUTE ABDOMEN SERIES (ABDOMEN 2 VIEW & CHEST 1 VIEW)  Comparison: 10/07/2012  Findings: Mild patchy left lower lobe opacity, atelectasis versus pneumonia.  Suspected right basilar atelectasis. No pleural effusion or pneumothorax.  The heart is top normal in size.  Nonobstructive bowel gas  pattern.  Left lower quadrant ostomy.  No evidence of free air on the lateral decubitus view.  Degenerative changes of the visualized thoracolumbar spine.  IMPRESSION: Mild patchy left lower lobe opacity, atelectasis  versus pneumonia.  No evidence of small bowel obstruction or free air.   Original Report Authenticated By: Charline Bills, M.D.    Dg Abd Acute W/chest  10/07/2012  *RADIOLOGY REPORT*  Clinical Data: Nausea.  Abdominal pain.  History of recent ascending colectomy.  ACUTE ABDOMEN SERIES (ABDOMEN 2 VIEW & CHEST 1 VIEW)  Comparison: 09/15/2012.  Findings: Heart size upper limits of normal for projection. Emphysematous changes in the lungs with basilar atelectasis.  There is no free air underneath the hemidiaphragms.  The bowel gas pattern is nonobstructive.  Colostomy appliance is present over the left central abdomen.  Lumbar spondylosis.  There is no small or large bowel dilation.  Surgical clips project over the left sacral ala.  Monitoring leads also project over the chest and abdomen. T12 vertebral augmentation.  IMPRESSION: Nonobstructive bowel gas pattern.  Enterostomy in the left mid abdomen.  No acute abnormality.   Original Report Authenticated By: Andreas Newport, M.D.     Microbiology: Recent Results (from the past 240 hour(s))  CULTURE, BLOOD (ROUTINE X 2)     Status: Normal (Preliminary result)   Collection Time   10/08/12 12:00 AM      Component Value Range Status Comment   Specimen Description BLOOD LEFT ARM   Final    Special Requests BOTTLES DRAWN AEROBIC ONLY 1CC   Final    Culture  Setup Time 10/09/2012 14:45   Final    Culture     Final    Value:        BLOOD CULTURE RECEIVED NO GROWTH TO DATE CULTURE WILL BE HELD FOR 5 DAYS BEFORE ISSUING A FINAL NEGATIVE REPORT   Report Status PENDING   Incomplete   CULTURE, BLOOD (ROUTINE X 2)     Status: Normal (Preliminary result)   Collection Time   10/08/12 12:00 AM      Component Value Range Status Comment   Specimen Description BLOOD LEFT HAND   Final    Special Requests BOTTLES DRAWN AEROBIC ONLY 10CC   Final    Culture  Setup Time 10/10/2012 15:32   Final    Culture     Final    Value:        BLOOD CULTURE RECEIVED NO GROWTH TO  DATE CULTURE WILL BE HELD FOR 5 DAYS BEFORE ISSUING A FINAL NEGATIVE REPORT   Report Status PENDING   Incomplete   URINE CULTURE     Status: Normal   Collection Time   10/08/12  6:00 PM      Component Value Range Status Comment   Specimen Description URINE, CLEAN CATCH   Final    Special Requests NONE   Final    Culture  Setup Time 10/09/2012 15:12   Final    Colony Count NO GROWTH   Final    Culture NO GROWTH   Final    Report Status 10/10/2012 FINAL   Final   MRSA PCR SCREENING     Status: Normal   Collection Time   10/09/12  4:52 AM      Component Value Range Status Comment   MRSA by PCR NEGATIVE  NEGATIVE Final   CLOSTRIDIUM DIFFICILE BY PCR     Status: Normal   Collection Time   10/10/12  7:53 AM  Component Value Range Status Comment   C difficile by pcr NEGATIVE  NEGATIVE Final      Labs: Basic Metabolic Panel:  Lab 10/14/12 4782 10/13/12 0508 10/12/12 0435 10/11/12 0442 10/10/12 0218  NA 140 141 140 136 132*  K 3.5 3.8 2.9* 3.8 4.2  CL 108 108 106 107 100  CO2 23 23 24 20 20   GLUCOSE 116* 116* 121* 121* 149*  BUN 11 13 13 20  24*  CREATININE 0.58 0.62 0.75 0.76 0.90  CALCIUM 8.2* 8.2* 8.2* 8.3* 8.6  MG -- -- 1.3* -- --  PHOS -- -- -- -- --   Liver Function Tests:  Lab 10/08/12 1600 10/07/12 1655  AST 36 31  ALT 35 22  ALKPHOS 121* 146*  BILITOT 0.4 0.7  PROT 5.9* 6.7  ALBUMIN 2.6* 2.9*    Lab 10/08/12 2040 10/08/12 1600 10/07/12 1655  LIPASE -- 16 17  AMYLASE 55 -- --   No results found for this basename: AMMONIA:5 in the last 168 hours CBC:  Lab 10/14/12 0440 10/13/12 0508 10/12/12 0435 10/11/12 0923 10/11/12 0442 10/10/12 0218 10/08/12 1600 10/07/12 1655  WBC 9.8 8.9 10.6* -- 10.9* 12.8* -- --  NEUTROABS -- -- -- -- -- -- 8.2* 10.5*  HGB 8.8* 8.4* 8.0* 7.7* 7.6* -- -- --  HCT 27.0* 25.7* 24.4* 23.3* 23.1* -- -- --  MCV 87.1 88.3 87.5 -- 88.2 88.7 -- --  PLT 292 247 267 -- 217 233 -- --   Cardiac Enzymes:  Lab 10/09/12 1522 10/09/12 0939  10/09/12 0300 10/08/12 2051  CKTOTAL -- -- 29 37  CKMB -- -- 3.1 4.1*  CKMBINDEX -- -- -- --  TROPONINI 0.36* 0.70* 0.79* --   BNP: BNP (last 3 results)  Basename 10/09/12 0942  PROBNP 7675.0*   CBG: No results found for this basename: GLUCAP:5 in the last 168 hours     Signed:  Benjamine Mola, Paitynn Mikus  [facility] 10/14/2012, 11:03 AM

## 2012-10-14 NOTE — Progress Notes (Signed)
Patient cleared for discharge. Packet copied and placed in [place]. Met with patient and son at bedside. [facility].  Nickia Boesen C. Anothony Bursch MSW, LCSW [phone]

## 2012-10-14 NOTE — Progress Notes (Signed)
Patient ID: Erin Hansen, female   DOB: Feb 18, 1926, 79 y.o.   MRN: [id number] Feels worn out, abdomen soft, colostomy pink and functional, hct has been stable She will need to see me 2 weeks after discharge in office.  Will f/u Monday if still here. You can switch her to lovenox and stop iv heparin now.  I think this is safe.

## 2012-10-14 NOTE — Progress Notes (Signed)
ANTICOAGULATION CONSULT NOTE   Pharmacy Consult: Changing from IV heparin to SQ Lovenox (plus warfarin) Indication: DVT/PE  Allergies  Allergen Reactions  . Codeine Nausea Only    Patient Measurements: Height: 5\' 9"  (175.3 cm) Weight: 152 lb 12.8 oz (69.31 kg) IBW/kg (Calculated) : 66.2   Labs:  Basename 10/14/12 0440 10/13/12 2215 10/13/12 1526 10/13/12 0508 10/12/12 0435  HGB 8.8* -- -- 8.4* --  HCT 27.0* -- -- 25.7* 24.4*  PLT 292 -- -- 247 267  APTT -- -- -- -- --  LABPROT 17.0* -- -- 15.5* 16.2*  INR 1.42 -- -- 1.25 1.33  HEPARINUNFRC 0.48 0.47 0.48 -- --  CREATININE 0.58 -- -- 0.62 0.75  CKTOTAL -- -- -- -- --  CKMB -- -- -- -- --  TROPONINI -- -- -- -- --    Estimated Creatinine Clearance: 53.7 ml/min (by C-G formula based on Cr of 0.58).   Assessment: 87 YOF on IV heparin/Coumadin bridge for DVT/PE. Aiming for a tighter INR goal range of 2-2.5 due to presence of subcapsular hematoma in R lobe of liver. Order now to change IV heparin to SQ lovenox.  Stop IV heparin, then 1 hour later, start Lovenox 1mg /kg q12h. CrCl ~ 76ml/min. INR remains subtherapeutic as expected after 3 conservative doses of warfarin (average time to therapeutic INR is 5-7 days of warfarin) Using conservative warfarin dosing due to patient's advanced age, low hgb, and liver hematoma No bleeding events reported in chart or per RN Today is Day # 4 of 5 Day minimum overlap with warfarin and heparin for acute VTE.  Goal of Therapy:  Heparin level 4 hours after dose: 0.6-1.2 units/ml INR 2- 2.5 -low end tx range Monitor platelets by anticoagulation protocol: Yes   Plan:  1.  Stop IV heparin 2) 1 hour later, start Lovenox 1mg /kg sq q12h (70mg  q12h) 3)  Daily PT/INR 4.  For discharge, suggest sending patient to SNF on warfarin 2.5mg  PO daily, with target INR of 2-2.5 and further dose titration based on INR trend. Also suggest continuing Lovenox 1mg /kg SQ q12h through tomorrow AND until INR>2 x2  consecutive days.   Darrol Angel, PharmD Pager: [phone] 10/14/2012 10:26 AM

## 2012-10-14 NOTE — Progress Notes (Signed)
ANTICOAGULATION CONSULT NOTE - Follow Up Consult  Pharmacy Consult for heparin Indication: pulmonary embolus and DVT  Allergies  Allergen Reactions  . Codeine Nausea Only    Patient Measurements: Height: 5\' 9"  (175.3 cm) Weight: 161 lb 12.8 oz (69.31 kg) IBW/kg (Calculated) : 66.2  Heparin Dosing Weight:   Vital Signs: Temp: 97.8 F (36.6 C) (02/06 2105) Temp src: Oral (02/06 2105) BP: 135/60 mmHg (02/06 2105) Pulse Rate: 78  (02/06 2105)  Labs:  Basename 10/13/12 2215 10/13/12 1526 10/13/12 0508 10/12/12 0435 10/11/12 0923 10/11/12 0442  HGB -- -- 8.4* 8.0* -- --  HCT -- -- 25.7* 24.4* 23.3* --  PLT -- -- 247 267 -- 217  APTT -- -- -- -- -- --  LABPROT -- -- 15.5* 16.2* -- --  INR -- -- 1.25 1.33 -- --  HEPARINUNFRC 0.47 0.48 0.16* -- -- --  CREATININE -- -- 0.62 0.75 -- 0.76  CKTOTAL -- -- -- -- -- --  CKMB -- -- -- -- -- --  TROPONINI -- -- -- -- -- --    Estimated Creatinine Clearance: 53.7 ml/min (by C-G formula based on Cr of 0.62).   Medications:  Infusions:    . heparin 1,450 Units/hr (10/13/12 0624)  . [DISCONTINUED] heparin 1,300 Units/hr (10/13/12 0600)    Assessment: Patient with heparin level at goal.  No issues note per RN.  Goal of Therapy:   Heparin level 0.3-0.5 units/ml Monitor platelets by anticoagulation protocol: Yes   Plan:  Continue heparin at current rate, recheck with am labs  Aleene Davidson Crowford 10/14/2012,12:14 AM

## 2012-10-14 NOTE — Progress Notes (Signed)
NUTRITION FOLLOW UP  Intervention:   Provide Carnation Instant Breakfast BID  Nutrition Dx:   Inadequate oral intake related to decreased appetite as evidenced by patient report of po intake 25% of meals.  Goal:   Pt to meet >/= 90% of their estimated nutrition needs; not met   Monitor:   PO intake Wt   Assessment:   Pt is s/p colon resection with colostomy placement. Pt is on fluid restriction due to mild hyponatremia. Pt reports poor appetite and eating only 25% of meals. Pt states she has not been receiving Ensure, possibly due to fluid restriction. RN Marcelino Duster states that pt has been getting Ensure but doesn't like it. Encouraged PO intake as much as pt can tolerate. Pt may be going home today per nursing rounds.   Height: Ht Readings from Last 1 Encounters:  10/08/12 5\' 9"  (1.753 m)    Weight Status:   Wt Readings from Last 1 Encounters:  10/10/12 152 lb 12.8 oz (69.31 kg)    Re-estimated needs:  Kcal: 1700-1900  Protein: 80 - 95  Fluid: Pt on 1 L fluid restriction  Skin: Abdominal incision; clean and dry  Diet Order: Cardiac   Intake/Output Summary (Last 24 hours) at 10/14/12 0931 Last data filed at 10/14/12 0706  Gross per 24 hour  Intake  830.9 ml  Output    600 ml  Net  230.9 ml    Last BM: Colostomy output 200 ml 2/7   Labs:   Lab 10/14/12 0440 10/13/12 0508 10/12/12 0435  NA 140 141 140  K 3.5 3.8 2.9*  CL 108 108 106  CO2 23 23 24   BUN 11 13 13   CREATININE 0.58 0.62 0.75  CALCIUM 8.2* 8.2* 8.2*  MG -- -- 1.3*  PHOS -- -- --  GLUCOSE 116* 116* 121*    CBG (last 3)  No results found for this basename: GLUCAP:3 in the last 72 hours  Scheduled Meds:   . antiseptic oral rinse  15 mL Mouth Rinse q12n4p  . chlorhexidine  15 mL Mouth Rinse BID  . erythromycin  1 application Both Eyes QHS  . feeding supplement  237 mL Oral BID BM  . ferrous sulfate  325 mg Oral QPC breakfast  . lactulose  40 g Oral QHS  . metoprolol tartrate  25 mg  Oral BID  . pantoprazole (PROTONIX) IV  40 mg Intravenous QHS  . potassium chloride SA  20 mEq Oral BID  . sertraline  75 mg Oral q morning - 10a  . sodium chloride  3 mL Intravenous Q12H  . sodium chloride  3 mL Intravenous Q12H  . Warfarin - Pharmacist Dosing Inpatient   Does not apply q1800    Continuous Infusions:   . heparin 1,450 Units/hr (10/14/12 0339)    Ian Malkin RD, LDN Inpatient Clinical Dietitian Pager: [phone] After Hours Pager: [phone]

## 2012-10-15 DIAGNOSIS — I80299 Phlebitis and thrombophlebitis of other deep vessels of unspecified lower extremity: Secondary | ICD-10-CM

## 2012-10-15 DIAGNOSIS — I2699 Other pulmonary embolism without acute cor pulmonale: Secondary | ICD-10-CM

## 2012-10-15 HISTORY — DX: Other pulmonary embolism without acute cor pulmonale: I26.99

## 2012-10-15 HISTORY — DX: Phlebitis and thrombophlebitis of other deep vessels of unspecified lower extremity: I80.299

## 2012-10-15 LAB — CULTURE, BLOOD (ROUTINE X 2): Culture: NO GROWTH

## 2012-10-16 LAB — CULTURE, BLOOD (ROUTINE X 2): Culture: NO GROWTH

## 2012-10-25 DIAGNOSIS — I2510 Atherosclerotic heart disease of native coronary artery without angina pectoris: Secondary | ICD-10-CM

## 2012-10-25 DIAGNOSIS — Z7901 Long term (current) use of anticoagulants: Secondary | ICD-10-CM

## 2012-10-25 HISTORY — DX: Atherosclerotic heart disease of native coronary artery without angina pectoris: I25.10

## 2012-10-25 HISTORY — DX: Long term (current) use of anticoagulants: Z79.01

## 2012-10-28 ENCOUNTER — Ambulatory Visit (INDEPENDENT_AMBULATORY_CARE_PROVIDER_SITE_OTHER): Payer: Medicare Other | Admitting: General Surgery

## 2012-10-28 ENCOUNTER — Encounter (INDEPENDENT_AMBULATORY_CARE_PROVIDER_SITE_OTHER): Payer: Medicare Other | Admitting: General Surgery

## 2012-10-28 ENCOUNTER — Encounter (INDEPENDENT_AMBULATORY_CARE_PROVIDER_SITE_OTHER): Payer: Self-pay | Admitting: General Surgery

## 2012-10-28 VITALS — BP 110/66 | HR 70 | Temp 97.3°F | Resp 18 | Ht 67.0 in | Wt 140.0 lb

## 2012-10-28 NOTE — Progress Notes (Signed)
Subjective:     Patient ID: Erin Hansen, female   DOB: 08/04/1926, 79 y.o.   MRN: [id number]  HPI 73 yof who Underwent a laparoscopic sigmoid colectomy with an end colostomy. She did well initially but then was readmitted for a pulmonary embolus. She now is at a facility. She is very slowly eating better. Her appetite is returning now. She is up moving around some more now as well. She is now on Coumadin. She's had no real trouble with her ostomy since last seen her.  Review of Systems     Objective:   Physical Exam Well-healed laparoscopic incisions and pink functional colostomy    Assessment:     Status post laparoscopic sigmoid colectomy for colon cancer     Plan:     I will see back in 3 months to see her she is doing. I am going to refer her back to see Dr. Truett Perna is well. She can increase her activity as tolerated and has no restrictions.

## 2012-10-31 ENCOUNTER — Other Ambulatory Visit: Payer: Self-pay | Admitting: *Deleted

## 2012-11-01 ENCOUNTER — Telehealth: Payer: Self-pay | Admitting: Oncology

## 2012-11-01 NOTE — Telephone Encounter (Signed)
called pt left appt d/t tomorrow 2/26 at 11:00am...td

## 2012-11-02 ENCOUNTER — Ambulatory Visit: Payer: Medicare Other | Admitting: Nurse Practitioner

## 2012-11-07 LAB — BASIC METABOLIC PANEL
Glucose: 104 mg/dL
Potassium: 3.9 mmol/L (ref 3.4–5.3)
Sodium: 140 mmol/L (ref 137–147)

## 2012-11-07 LAB — CBC AND DIFFERENTIAL: Hemoglobin: 10.4 g/dL — AB (ref 12.0–16.0)

## 2012-11-11 ENCOUNTER — Ambulatory Visit (HOSPITAL_BASED_OUTPATIENT_CLINIC_OR_DEPARTMENT_OTHER): Payer: Medicare Other | Admitting: Nurse Practitioner

## 2012-11-11 ENCOUNTER — Telehealth: Payer: Self-pay | Admitting: Oncology

## 2012-11-11 VITALS — BP 113/56 | HR 67 | Temp 97.7°F | Resp 18 | Ht 67.0 in | Wt 142.6 lb

## 2012-11-11 DIAGNOSIS — K625 Hemorrhage of anus and rectum: Secondary | ICD-10-CM

## 2012-11-11 DIAGNOSIS — I82409 Acute embolism and thrombosis of unspecified deep veins of unspecified lower extremity: Secondary | ICD-10-CM

## 2012-11-11 DIAGNOSIS — I2699 Other pulmonary embolism without acute cor pulmonale: Secondary | ICD-10-CM

## 2012-11-11 MED ORDER — ENOXAPARIN SODIUM 150 MG/ML ~~LOC~~ SOLN: 1.5000 mg/kg | Freq: Every day | SUBCUTANEOUS | Status: DC

## 2012-11-11 MED ORDER — ENOXAPARIN SODIUM 100 MG/ML ~~LOC~~ SOLN: 100.0000 mg | Freq: Every day | SUBCUTANEOUS | Status: DC

## 2012-11-11 NOTE — Telephone Encounter (Signed)
Gave pt appt for March chemo class and MD and lab visit for MArch 2014

## 2012-11-11 NOTE — Progress Notes (Signed)
OFFICE PROGRESS NOTE  Interval history:  Erin Hansen underwent a laparoscopic sigmoid colectomy with colostomy by Dr. Dwain Sarna on 09/20/2012. Pathology showed a 6 cm colorectal adenocarcinoma focally extending into pericolonic connective tissue. Margins were negative. Lymphovascular invasion was present. There was no perineural invasion. Metastatic carcinoma was identified in 1 of 15 lymph nodes.  Of note, a preoperative CEA on 09/15/2012 returned in normal range at 2.8.  She was readmitted to 10/09/2012 through 10/14/2012 with bilateral pulmonary emboli. Venous Doppler showed an acute deep vein thrombosis involving the posterior tibial and peroneal veins of the right lower extremity from mid to proximal calf.  A liver hematoma was noted on CT on 10/08/2012. She was evaluated by surgery. The hematoma was felt to likely be related to the liver biopsy. Observation was recommended.  She is residing at [facility] in the healthcare section. She is participating in physical therapy 3 times per week. She is ambulating with a walker. Oral intake varies. She denies nausea/vomiting. No abdominal pain. Colostomy is functioning.   Objective: Blood pressure 113/56, pulse 67, temperature 97.7 F (36.5 C), temperature source Oral, resp. rate 18, height 5\' 7"  (1.702 m), weight 142 lb 9.6 oz (64.683 kg).  Oropharynx is without thrush or ulceration. Lungs are clear. Regular cardiac rhythm. Abdomen is soft and nontender. No hepatomegaly. Left abdomen colostomy. Extremities are without edema.  Lab Results: Lab Results  Component Value Date   WBC 9.8 10/14/2012   HGB 8.8* 10/14/2012   HCT 27.0* 10/14/2012   MCV 87.1 10/14/2012   PLT 292 10/14/2012    Chemistry:    Chemistry      Component Value Date/Time   NA 140 10/14/2012 0440   K 3.5 10/14/2012 0440   CL 108 10/14/2012 0440   CO2 23 10/14/2012 0440   BUN 11 10/14/2012 0440   CREATININE 0.58 10/14/2012 0440      Component Value Date/Time   CALCIUM 8.2* 10/14/2012  0440   ALKPHOS 121* 10/08/2012 1600   AST 36 10/08/2012 1600   ALT 35 10/08/2012 1600   BILITOT 0.4 10/08/2012 1600       Studies/Results: No results found.  Medications: I have reviewed the patient's current medications.  Assessment/Plan:  1. 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 2. Normal preoperative CEA 09/15/2012 (2.8). 3. Hospitalization 10/09/2012 through 10/14/2012 with bilateral pulmonary emboli, right lower extremity DVT. She is on Coumadin anticoagulation. 4. Diarrhea/rectal bleeding secondary to the sigmoid colon tumor. 5. Status post liver biopsy 09/14/2012 with pathology showing benign liver, negative for malignancy. 6. Liver hematoma on CT 10/08/2012 felt to likely be related to the liver biopsy.  Disposition-Dr. Truett Perna reviewed the diagnosis, prognosis and treatment options with Erin Hansen and her son. She would like to proceed with adjuvant therapy. Dr. Truett Perna recommends a six-month course of Xeloda. We reviewed potential toxicities associated with Xeloda including mouth sores, nausea, diarrhea, skin hyperpigmentation, skin rash and hand-foot syndrome. She will attend a chemotherapy education class.  She is currently on Coumadin for treatment of recent bilateral pulmonary emboli and right lower extremity DVT. There is potential for a significant interaction between Coumadin and Xeloda making management of the Coumadin difficult. Dr. Truett Perna recommends discontinuation of Coumadin and initiation of Lovenox (1.5 mg per  kilogram) total dose 100 mg subcutaneous daily. We provided her with a prescription for the Lovenox at the end of today's  visit. She will submit the prescription to the nursing facility.  We anticipate she will begin the first cycle of Xeloda on 11/18/2012. She will return for a followup visit on 12/05/2012. She will contact the office in the interim with any problems.  Patient seen with Dr. Truett Perna.     Lonna Cobb ANP/GNP-BC

## 2012-11-15 ENCOUNTER — Other Ambulatory Visit: Payer: Medicare Other

## 2012-11-15 ENCOUNTER — Telehealth: Payer: Self-pay | Admitting: *Deleted

## 2012-11-15 ENCOUNTER — Other Ambulatory Visit (HOSPITAL_BASED_OUTPATIENT_CLINIC_OR_DEPARTMENT_OTHER): Payer: Medicare Other | Admitting: Lab

## 2012-11-15 ENCOUNTER — Encounter: Payer: Self-pay | Admitting: *Deleted

## 2012-11-15 DIAGNOSIS — C187 Malignant neoplasm of sigmoid colon: Secondary | ICD-10-CM

## 2012-11-15 LAB — CBC WITH DIFFERENTIAL/PLATELET
BASO%: 0.7 % (ref 0.0–2.0)
Eosinophils Absolute: 0.1 10*3/uL (ref 0.0–0.5)
LYMPH%: 32.3 % (ref 14.0–49.7)
MONO#: 0.7 10*3/uL (ref 0.1–0.9)
NEUT#: 4.7 10*3/uL (ref 1.5–6.5)
Platelets: 255 10*3/uL (ref 145–400)
RBC: 3.79 10*6/uL (ref 3.70–5.45)
WBC: 8.1 10*3/uL (ref 3.9–10.3)
lymph#: 2.6 10*3/uL (ref 0.9–3.3)

## 2012-11-15 LAB — COMPREHENSIVE METABOLIC PANEL (CC13)
ALT: 15 U/L (ref 0–55)
Albumin: 3 g/dL — ABNORMAL LOW (ref 3.5–5.0)
CO2: 27 mEq/L (ref 22–29)
Calcium: 9.2 mg/dL (ref 8.4–10.4)
Chloride: 106 mEq/L (ref 98–107)
Glucose: 114 mg/dl — ABNORMAL HIGH (ref 70–99)
Potassium: 3.7 mEq/L (ref 3.5–5.1)
Sodium: 141 mEq/L (ref 136–145)
Total Protein: 6.5 g/dL (ref 6.4–8.3)

## 2012-11-15 NOTE — Telephone Encounter (Signed)
No additional notes

## 2012-11-16 ENCOUNTER — Encounter: Payer: Self-pay | Admitting: *Deleted

## 2012-11-16 ENCOUNTER — Telehealth: Payer: Self-pay | Admitting: *Deleted

## 2012-11-16 NOTE — Telephone Encounter (Signed)
Press photographer at [facility], [place], aware that will be calling her with the xeloda dosage when script is written.

## 2012-11-17 ENCOUNTER — Telehealth: Payer: Self-pay | Admitting: *Deleted

## 2012-11-17 ENCOUNTER — Encounter: Payer: Self-pay | Admitting: *Deleted

## 2012-11-17 MED ORDER — CAPECITABINE 500 MG PO TABS: ORAL_TABLET | ORAL | Status: DC

## 2012-11-17 NOTE — Telephone Encounter (Signed)
No additional notes

## 2012-11-18 ENCOUNTER — Telehealth: Payer: Self-pay | Admitting: Pharmacist

## 2012-11-18 DIAGNOSIS — I10 Essential (primary) hypertension: Secondary | ICD-10-CM

## 2012-11-18 HISTORY — DX: Essential (primary) hypertension: I10

## 2012-12-05 ENCOUNTER — Other Ambulatory Visit (HOSPITAL_BASED_OUTPATIENT_CLINIC_OR_DEPARTMENT_OTHER): Payer: Medicare Other | Admitting: Lab

## 2012-12-05 ENCOUNTER — Telehealth: Payer: Self-pay | Admitting: Oncology

## 2012-12-05 ENCOUNTER — Ambulatory Visit (HOSPITAL_BASED_OUTPATIENT_CLINIC_OR_DEPARTMENT_OTHER): Payer: Medicare Other | Admitting: Nurse Practitioner

## 2012-12-05 VITALS — BP 116/87 | HR 68 | Temp 98.1°F | Resp 20 | Ht 67.0 in | Wt 142.9 lb

## 2012-12-05 DIAGNOSIS — I2699 Other pulmonary embolism without acute cor pulmonale: Secondary | ICD-10-CM

## 2012-12-05 DIAGNOSIS — I82409 Acute embolism and thrombosis of unspecified deep veins of unspecified lower extremity: Secondary | ICD-10-CM

## 2012-12-05 DIAGNOSIS — C187 Malignant neoplasm of sigmoid colon: Secondary | ICD-10-CM

## 2012-12-05 DIAGNOSIS — C189 Malignant neoplasm of colon, unspecified: Secondary | ICD-10-CM

## 2012-12-05 DIAGNOSIS — K625 Hemorrhage of anus and rectum: Secondary | ICD-10-CM

## 2012-12-05 LAB — COMPREHENSIVE METABOLIC PANEL (CC13)
ALT: 10 U/L (ref 0–55)
Albumin: 2.8 g/dL — ABNORMAL LOW (ref 3.5–5.0)
Alkaline Phosphatase: 68 U/L (ref 40–150)
CO2: 24 mEq/L (ref 22–29)
Glucose: 110 mg/dl — ABNORMAL HIGH (ref 70–99)
Potassium: 3.9 mEq/L (ref 3.5–5.1)
Sodium: 144 mEq/L (ref 136–145)
Total Bilirubin: 0.27 mg/dL (ref 0.20–1.20)
Total Protein: 6.3 g/dL — ABNORMAL LOW (ref 6.4–8.3)

## 2012-12-05 LAB — CBC WITH DIFFERENTIAL/PLATELET
BASO%: 0.7 % (ref 0.0–2.0)
Eosinophils Absolute: 0.1 10*3/uL (ref 0.0–0.5)
LYMPH%: 40 % (ref 14.0–49.7)
MCHC: 33.2 g/dL (ref 31.5–36.0)
MONO#: 0.7 10*3/uL (ref 0.1–0.9)
NEUT#: 3.4 10*3/uL (ref 1.5–6.5)
RBC: 3.43 10*6/uL — ABNORMAL LOW (ref 3.70–5.45)
RDW: 17.4 % — ABNORMAL HIGH (ref 11.2–14.5)
WBC: 7.2 10*3/uL (ref 3.9–10.3)

## 2012-12-05 NOTE — Progress Notes (Signed)
OFFICE PROGRESS NOTE  Interval history:  Erin Hansen returns as scheduled. She completed cycle 1 adjuvant Xeloda beginning 11/17/2012. She had a single episode of nausea/vomiting. No mouth sores. No diarrhea. No hand or foot pain or redness. She denies abdominal pain. She continues to have a good appetite.   Objective: Blood pressure 116/87, pulse 68, temperature 98.1 F (36.7 C), temperature source Oral, resp. rate 20, height 5\' 7"  (1.702 m), weight 142 lb 14.4 oz (64.819 kg).  Oropharynx is without thrush or ulceration. Lungs are clear. Regular cardiac rhythm. Abdomen soft and nontender. No hepatomegaly. Left lower quadrant ostomy. Extremities without edema. Palms are nontender and without erythema.  Lab Results: Lab Results  Component Value Date   WBC 7.2 12/05/2012   HGB 10.0* 12/05/2012   HCT 30.2* 12/05/2012   MCV 88.1 12/05/2012   PLT 275 12/05/2012    Chemistry:    Chemistry      Component Value Date/Time   NA 141 11/15/2012 0852   NA 140 10/14/2012 0440   K 3.7 11/15/2012 0852   K 3.5 10/14/2012 0440   CL 106 11/15/2012 0852   CL 108 10/14/2012 0440   CO2 27 11/15/2012 0852   CO2 23 10/14/2012 0440   BUN 14.8 11/15/2012 0852   BUN 11 10/14/2012 0440   CREATININE 0.8 11/15/2012 0852   CREATININE 0.58 10/14/2012 0440      Component Value Date/Time   CALCIUM 9.2 11/15/2012 0852   CALCIUM 8.2* 10/14/2012 0440   ALKPHOS 82 11/15/2012 0852   ALKPHOS 121* 10/08/2012 1600   AST 11 11/15/2012 0852   AST 36 10/08/2012 1600   ALT 15 11/15/2012 0852   ALT 35 10/08/2012 1600   BILITOT 0.81 11/15/2012 0852   BILITOT 0.4 10/08/2012 1600       Studies/Results: No results found.  Medications: I have reviewed the patient's current medications.  Assessment/Plan:  1. 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 She began cycle 1 adjuvant Xeloda 11/17/2012. 2. Normal preoperative CEA 09/15/2012 (2.8). 3. Hospitalization 10/09/2012 through 10/14/2012 with bilateral pulmonary emboli, right lower extremity DVT. Previously on Coumadin. Anticoagulation changed to Lovenox prior to beginning to Xeloda. 4. Diarrhea/rectal bleeding secondary to the sigmoid colon tumor. 5. Status post liver biopsy 09/14/2012 with pathology showing benign liver, negative for malignancy. 6. Liver hematoma on CT 10/08/2012 felt to likely be related to the liver biopsy.  Disposition-Erin Hansen tolerated the first cycle of adjuvant Xeloda well. Plan to proceed with cycle 2 on 12/08/2012. She will return for a followup visit on 12/23/2012. She will contact the office in the interim with any problems.  Lonna Cobb ANP/GNP-BC

## 2012-12-05 NOTE — Telephone Encounter (Signed)
Gave pt appt for lab and MD on April 2014 °

## 2012-12-08 ENCOUNTER — Non-Acute Institutional Stay (SKILLED_NURSING_FACILITY): Payer: BLUE CROSS/BLUE SHIELD | Admitting: Nurse Practitioner

## 2012-12-08 DIAGNOSIS — Z7901 Long term (current) use of anticoagulants: Secondary | ICD-10-CM

## 2012-12-08 DIAGNOSIS — Z933 Colostomy status: Secondary | ICD-10-CM

## 2012-12-08 DIAGNOSIS — H01009 Unspecified blepharitis unspecified eye, unspecified eyelid: Secondary | ICD-10-CM

## 2012-12-08 DIAGNOSIS — D62 Acute posthemorrhagic anemia: Secondary | ICD-10-CM

## 2012-12-08 DIAGNOSIS — C189 Malignant neoplasm of colon, unspecified: Secondary | ICD-10-CM

## 2012-12-08 DIAGNOSIS — IMO0002 Reserved for concepts with insufficient information to code with codable children: Secondary | ICD-10-CM

## 2012-12-08 DIAGNOSIS — I2699 Other pulmonary embolism without acute cor pulmonale: Secondary | ICD-10-CM

## 2012-12-08 DIAGNOSIS — F329 Major depressive disorder, single episode, unspecified: Secondary | ICD-10-CM

## 2012-12-08 DIAGNOSIS — K589 Irritable bowel syndrome without diarrhea: Secondary | ICD-10-CM

## 2012-12-08 DIAGNOSIS — I219 Acute myocardial infarction, unspecified: Secondary | ICD-10-CM

## 2012-12-08 DIAGNOSIS — I214 Non-ST elevation (NSTEMI) myocardial infarction: Secondary | ICD-10-CM

## 2012-12-08 DIAGNOSIS — I10 Essential (primary) hypertension: Secondary | ICD-10-CM

## 2012-12-08 DIAGNOSIS — M1990 Unspecified osteoarthritis, unspecified site: Secondary | ICD-10-CM

## 2012-12-08 DIAGNOSIS — R32 Unspecified urinary incontinence: Secondary | ICD-10-CM

## 2012-12-08 DIAGNOSIS — R609 Edema, unspecified: Secondary | ICD-10-CM

## 2012-12-08 DIAGNOSIS — E876 Hypokalemia: Secondary | ICD-10-CM

## 2012-12-08 NOTE — Progress Notes (Signed)
Subjective:    Patient ID: Erin Hansen, female    DOB: 20-Aug-1926, 79 y.o.   MRN: [id number]  HPI   12/09/12 Depression: seems better in mood since Sertraline up to 100mg  11/18/12--but the patient has very little desire for colostomy care Staff reported the patient has worsened depression, stays in her room, less interaction with others, no interest in grooming or rehabilitation or colostomy care, not sleep at night, became paranoia, Sertraline at 75mg  apparently is not adequate. Saw Dr. Myrle Sheng 11/11/12 switched from Coumadin to Lovenox since Coumadin and Xeloda(will start 11/18/12 for stage II colon caner tx) interaction will be significant.  10/08/12-10/14/12 readmitted to hospital with presenting symptoms of SOB, O2 desaturation, N/V, and progressive weakness. bilateral PE/DVT, PNA(off ABT), NSTEMI(elevated Troponin--low dose Lopressor added per cardiology and probably related to right ventricular strain from acute PE), liver hematoma(due to a recent biopsy per CT 10/08/12 and Ok for anticoagulation per Dr. Darylene Price general surgeon) were identified. Overlap Lovenox/Coumadin with PT/INR daily until INR 2-2.5 x 2 days. O2 sat 94% with O2 2lpm [place].  09/10/12-09/27/12 hospital for sigmoid colon cancer stage III--underwent laparoscopic sigmoid colectomy with colostomy--resolved her ileus post op 153.9-NEOPLASM, COLON  sigmoid, stage III, s/p laparoscopic colectomy and colostomy. Prn Percocet helped w/o drug reaction even if the patient has Codeine listed as drug allergy. Xeloda starts 11/18/12 NEOPLASM OF UNCERTAIN BEHAVIOR OF LIVER AND BILIAR  noted nodule per CT while in hospital-s/p biopsy--hematoma VITAMIN D DEFICIENCY  stopped Cholecalciferol 1000units daily.  HYPOKALEMIA  corrected with Kcl , serum K 3.9 11/07/12 ANEMIA, BLOOD LOSS ACUTE  continue to improve,  takes Fe 325mg , Hgb 9.2 10/03/12--Hgb 10.4 11/07/12 DEPRESSION, ACUTE MAJOR  staff thinks the patient is more depressed, but the patient denied, on  Sertraline 100mg  since 11/28/12 and Melatonin 5mg  as needed to help rest at night.  BLEPHARITIS  chronic, Erythromycin oint CAD  takes Metoprolol 25mg  bid. Had NSTEMI 10/2012  SPASTIC COLON  stable on Lactulose 40gm qhs and Docusate 100mg  qhs as needed OSTEOARTHRITIS  multiple sites. Take Tylenol 1000mg  q4hrs prn and Norco prn DDD  chronic lower back pain. Hx of veterbroplasty of T12 EDEMA  trace in BLE.  788.30-INCONTINENCE, URINARY  chronic V44.3-COLOSTOMY STATUS  new.  V58.61-ANTICOAG, LONG TERM TX.  R+L PE and RLE DVT--Lovenox 401.9-HTN UNSPECIFIED    Mildly elevated systolic bp, takes Metoprolol 25mg  bid.    Review of Systems  Constitutional: Positive for fatigue. Negative for fever, chills, diaphoresis, activity change and appetite change.  HENT: Negative for hearing loss, congestion, rhinorrhea, trouble swallowing, neck pain, neck stiffness, voice change, postnasal drip and sinus pressure.   Eyes: Negative for pain, discharge, itching and visual disturbance.  Respiratory: Positive for shortness of breath (on exertion ). Negative for cough, choking, chest tightness and wheezing.   Cardiovascular: Positive for leg swelling (trace). Negative for chest pain and palpitations.  Gastrointestinal: Negative for nausea, vomiting, abdominal pain, diarrhea, constipation and abdominal distention.       Colostomy presents  Endocrine: Negative.   Genitourinary: Negative for dysuria, urgency, frequency, flank pain and pelvic pain.  Musculoskeletal: Positive for back pain, arthralgias and gait problem. Negative for myalgias and joint swelling.  Skin: Positive for wound (left lower abd colostomy). Negative for rash.  Allergic/Immunologic: Negative.   Neurological: Negative for tremors, seizures, speech difficulty, weakness, light-headedness, numbness and headaches.  Hematological: Negative.   Psychiatric/Behavioral: Positive for confusion (occasinally). Negative for hallucinations, behavioral  problems, sleep disturbance, dysphoric mood, decreased concentration and agitation. The patient is  not nervous/anxious.        Objective:   Physical Exam  Constitutional: She is oriented to person, place, and time. She appears well-developed and well-nourished.  HENT:  Head: Normocephalic and atraumatic.  Eyes: Conjunctivae and EOM are normal. Pupils are equal, round, and reactive to light.  Neck: Normal range of motion. Neck supple. No JVD present. No tracheal deviation present.  Cardiovascular: Normal rate and regular rhythm.   No murmur heard. Pulmonary/Chest: Effort normal and breath sounds normal. She has no wheezes. She has no rales.  Abdominal: Soft. Bowel sounds are normal. There is no tenderness.  Musculoskeletal: Normal range of motion. She exhibits no edema and no tenderness.  Lymphadenopathy:    She has no cervical adenopathy.  Neurological: She is alert and oriented to person, place, and time. She has normal reflexes. She displays normal reflexes. No cranial nerve deficit. She exhibits normal muscle tone. Coordination normal.  Skin: Skin is warm and dry. No rash noted.  Psychiatric: Her speech is normal. She is not agitated, not aggressive, not slowed, not withdrawn and not actively hallucinating. Thought content is not paranoid and not delusional. She expresses inappropriate judgment. She does not express impulsivity. She exhibits a depressed mood. She exhibits abnormal recent memory.     LABS REVIEWED:  09/24/12  Na 139, K 3.4, glucose 118, Bun 4, creatinine 0.47, Ca 8.2   wbc 7.5, Hgb 7.4, Hct 23.7, plt 316 09/25/12  wbc 6.7, Hgb 7.2, Hct 22.8, plt 312 09/29/12  CBC wbc 8.3, Hgb 8.3, Hct 25.2, plt 314   CMP Na 139, K 3.7, glucose 99, Bun 7, creatinine 0.48, Ca 8.5, LFT wnl except total protein 5.0, albumin 2.7   TSH 1.335 10/2012 hospital   Na 132-141, K 2.9-4.2, glucose 116-149, Bun 11-24, creatinine 0.58-0.90, Ca 8.2-8.6   AST 31-36, ALT 22-35, alk phos 121-146, bilitot  0.4-0.7, prot 5.9-6.7, albumin 2.6-2.9   Lipase 16-17, Amylase 55   wbc 8.9-12.8, Hgb 7.6-8.8, Hct 23.1-27.0, plt 217-292   CK total 29-37, CKMB 3.1-4.1, Troponin I 0.36-0.79   ProBNP 7675.0 10/18/12  Hbsag  negative, Hcab negative, HIV 1&2 ab no reactive 10/24/12  UA yeast 10,000c/ml--treated with Diflucan.    BMP Na 139, K 4.0, glucose 95, Bun 12, creatinine 0.65, Ca 8.9   PT/INR 18.0/1.52 11/07/12   CBC wbc 7.4, Hgb 10.4, Hct 31.9, plt 330   BMP Na 140, K 3.9, glucose 104, Bun 21, creatinine 0.81, Ca 9.1      Assessment & Plan:   Colon cancer Currently under chemo per Oncology  . Non-ST elevation MI (NSTEMI) Metoprolol and anticoagulation .   Marland Kitchen Liver hematoma Stable, as resultant of liver biopsy   . Bilateral pulmonary embolism chronic anticoagulation  . Hypopotassemia Resolved on K daily  . Major depressive disorder, single episode, unspecified Seems better  . Irritable bowel syndrome  no problem.   . Osteoarthrosis, unspecified whether generalized or localized, unspecified site Prn Norco is adequate.   Edema trace  . Colostomy status The patient needs to learn Colotomy care is her goal is to return [place] apartment at [facility]  . Long term (current) use of anticoagulants For PE and DVT  . Unspecified essential hypertension Mildly elevated SBP w/o apparent symptoms--observe.

## 2012-12-09 DIAGNOSIS — R609 Edema, unspecified: Secondary | ICD-10-CM | POA: Insufficient documentation

## 2012-12-09 DIAGNOSIS — F323 Major depressive disorder, single episode, severe with psychotic features: Secondary | ICD-10-CM | POA: Insufficient documentation

## 2012-12-09 DIAGNOSIS — R32 Unspecified urinary incontinence: Secondary | ICD-10-CM | POA: Insufficient documentation

## 2012-12-09 DIAGNOSIS — D62 Acute posthemorrhagic anemia: Secondary | ICD-10-CM | POA: Insufficient documentation

## 2012-12-09 DIAGNOSIS — IMO0002 Reserved for concepts with insufficient information to code with codable children: Secondary | ICD-10-CM | POA: Insufficient documentation

## 2012-12-09 DIAGNOSIS — Z933 Colostomy status: Secondary | ICD-10-CM | POA: Insufficient documentation

## 2012-12-09 DIAGNOSIS — I10 Essential (primary) hypertension: Secondary | ICD-10-CM | POA: Insufficient documentation

## 2012-12-09 DIAGNOSIS — K589 Irritable bowel syndrome without diarrhea: Secondary | ICD-10-CM | POA: Insufficient documentation

## 2012-12-09 DIAGNOSIS — H01009 Unspecified blepharitis unspecified eye, unspecified eyelid: Secondary | ICD-10-CM | POA: Insufficient documentation

## 2012-12-09 DIAGNOSIS — M1990 Unspecified osteoarthritis, unspecified site: Secondary | ICD-10-CM | POA: Insufficient documentation

## 2012-12-09 DIAGNOSIS — Z7901 Long term (current) use of anticoagulants: Secondary | ICD-10-CM | POA: Insufficient documentation

## 2012-12-09 DIAGNOSIS — E876 Hypokalemia: Secondary | ICD-10-CM | POA: Insufficient documentation

## 2012-12-23 ENCOUNTER — Telehealth: Payer: Self-pay | Admitting: Oncology

## 2012-12-23 ENCOUNTER — Other Ambulatory Visit (HOSPITAL_BASED_OUTPATIENT_CLINIC_OR_DEPARTMENT_OTHER): Payer: Medicare Other | Admitting: Lab

## 2012-12-23 ENCOUNTER — Ambulatory Visit (HOSPITAL_BASED_OUTPATIENT_CLINIC_OR_DEPARTMENT_OTHER): Payer: Medicare Other | Admitting: Oncology

## 2012-12-23 VITALS — BP 131/43 | HR 64 | Temp 97.8°F | Resp 18 | Ht 67.0 in | Wt 140.8 lb

## 2012-12-23 DIAGNOSIS — C189 Malignant neoplasm of colon, unspecified: Secondary | ICD-10-CM

## 2012-12-23 DIAGNOSIS — C187 Malignant neoplasm of sigmoid colon: Secondary | ICD-10-CM

## 2012-12-23 DIAGNOSIS — R197 Diarrhea, unspecified: Secondary | ICD-10-CM

## 2012-12-23 LAB — COMPREHENSIVE METABOLIC PANEL (CC13)
ALT: 13 U/L (ref 0–55)
AST: 13 U/L (ref 5–34)
Albumin: 3.2 g/dL — ABNORMAL LOW (ref 3.5–5.0)
Alkaline Phosphatase: 76 U/L (ref 40–150)
Potassium: 4.4 mEq/L (ref 3.5–5.1)
Sodium: 141 mEq/L (ref 136–145)
Total Bilirubin: 0.52 mg/dL (ref 0.20–1.20)
Total Protein: 6.8 g/dL (ref 6.4–8.3)

## 2012-12-23 LAB — CBC WITH DIFFERENTIAL/PLATELET
BASO%: 0.3 % (ref 0.0–2.0)
Eosinophils Absolute: 0.1 10*3/uL (ref 0.0–0.5)
LYMPH%: 29.4 % (ref 14.0–49.7)
MCHC: 32 g/dL (ref 31.5–36.0)
MCV: 90.9 fL (ref 79.5–101.0)
MONO%: 10.3 % (ref 0.0–14.0)
NEUT#: 4.4 10*3/uL (ref 1.5–6.5)
Platelets: 231 10*3/uL (ref 145–400)
RBC: 3.45 10*6/uL — ABNORMAL LOW (ref 3.70–5.45)
RDW: 21.7 % — ABNORMAL HIGH (ref 11.2–14.5)
WBC: 7.4 10*3/uL (ref 3.9–10.3)

## 2012-12-23 NOTE — Telephone Encounter (Signed)
gv and printed appt schedule and avs for pt.... °

## 2012-12-23 NOTE — Progress Notes (Signed)
   [facility]    OFFICE PROGRESS NOTE   INTERVAL HISTORY:   She returns as scheduled. She completed a second cycle of Xeloda beginning on 12/08/2012. She reports tolerating the Xeloda well. No mouth sores, hand/foot pain, or diarrhea. She complains of "gas "in the ostomy bag.  Objective:  Vital signs in last 24 hours:  Blood pressure 131/43, pulse 64, temperature 97.8 F (36.6 C), temperature source Oral, resp. rate 18, height 5\' 7"  (1.702 m), weight 140 lb 12.8 oz (63.866 kg), SpO2 96.00%.    HEENT: No thrush or ulcers Resp: Lungs clear bilateral Cardio: Regular rate and rhythm GI: No hepatomegaly, semi-formed stool in the ostomy bag Vascular: No leg edema  Skin: Palms without erythema or skin thickening     Lab Results:  Lab Results  Component Value Date   WBC 7.4 12/23/2012   HGB 10.0* 12/23/2012   HCT 31.3* 12/23/2012   MCV 90.9 12/23/2012   PLT 231 12/23/2012   ANC 4.4   Medications: I have reviewed the patient's current medications.  Assessment/Plan: 1. 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 She began cycle 1 adjuvant Xeloda 11/17/2012. 2. Normal preoperative CEA 09/15/2012 (2.8). 3. Hospitalization 10/09/2012 through 10/14/2012 with bilateral pulmonary emboli, right lower extremity DVT. Previously on Coumadin. Anticoagulation changed to Lovenox prior to beginning to Xeloda. 4. Diarrhea/rectal bleeding secondary to the sigmoid colon tumor. 5. Status post liver biopsy 09/14/2012 with pathology showing benign liver, negative for malignancy. 6. Liver hematoma on CT 10/08/2012 felt to likely be related to the liver  biopsy.  Disposition:  She has completed 2 cycles of Xeloda. Erin Hansen is tolerating the chemotherapy well. She will begin cycle 3 on 12/29/2012. She will return for an office visit on 01/13/2013.  She is taking lactulose daily. I suggest discontinuing lactulose if she continues to have loose stool.   Thornton Papas, MD  12/23/2012  1:26 PM

## 2013-01-13 ENCOUNTER — Ambulatory Visit (HOSPITAL_BASED_OUTPATIENT_CLINIC_OR_DEPARTMENT_OTHER): Payer: Medicare Other | Admitting: Nurse Practitioner

## 2013-01-13 ENCOUNTER — Other Ambulatory Visit (HOSPITAL_BASED_OUTPATIENT_CLINIC_OR_DEPARTMENT_OTHER): Payer: Medicare Other | Admitting: Lab

## 2013-01-13 ENCOUNTER — Non-Acute Institutional Stay (SKILLED_NURSING_FACILITY): Payer: Medicare Other | Admitting: Nurse Practitioner

## 2013-01-13 ENCOUNTER — Telehealth: Payer: Self-pay | Admitting: Oncology

## 2013-01-13 VITALS — BP 127/56 | HR 61 | Temp 97.8°F | Resp 18 | Ht 67.0 in | Wt 141.5 lb

## 2013-01-13 DIAGNOSIS — Z933 Colostomy status: Secondary | ICD-10-CM

## 2013-01-13 DIAGNOSIS — C189 Malignant neoplasm of colon, unspecified: Secondary | ICD-10-CM

## 2013-01-13 DIAGNOSIS — F329 Major depressive disorder, single episode, unspecified: Secondary | ICD-10-CM

## 2013-01-13 DIAGNOSIS — R32 Unspecified urinary incontinence: Secondary | ICD-10-CM

## 2013-01-13 DIAGNOSIS — E876 Hypokalemia: Secondary | ICD-10-CM

## 2013-01-13 DIAGNOSIS — Z7901 Long term (current) use of anticoagulants: Secondary | ICD-10-CM

## 2013-01-13 DIAGNOSIS — H01009 Unspecified blepharitis unspecified eye, unspecified eyelid: Secondary | ICD-10-CM

## 2013-01-13 DIAGNOSIS — M1990 Unspecified osteoarthritis, unspecified site: Secondary | ICD-10-CM

## 2013-01-13 DIAGNOSIS — I219 Acute myocardial infarction, unspecified: Secondary | ICD-10-CM

## 2013-01-13 DIAGNOSIS — I214 Non-ST elevation (NSTEMI) myocardial infarction: Secondary | ICD-10-CM

## 2013-01-13 DIAGNOSIS — D649 Anemia, unspecified: Secondary | ICD-10-CM

## 2013-01-13 DIAGNOSIS — I10 Essential (primary) hypertension: Secondary | ICD-10-CM

## 2013-01-13 DIAGNOSIS — IMO0002 Reserved for concepts with insufficient information to code with codable children: Secondary | ICD-10-CM

## 2013-01-13 DIAGNOSIS — K625 Hemorrhage of anus and rectum: Secondary | ICD-10-CM

## 2013-01-13 DIAGNOSIS — S36112D Contusion of liver, subsequent encounter: Secondary | ICD-10-CM

## 2013-01-13 DIAGNOSIS — Z5189 Encounter for other specified aftercare: Secondary | ICD-10-CM

## 2013-01-13 DIAGNOSIS — I2699 Other pulmonary embolism without acute cor pulmonale: Secondary | ICD-10-CM

## 2013-01-13 DIAGNOSIS — E559 Vitamin D deficiency, unspecified: Secondary | ICD-10-CM

## 2013-01-13 DIAGNOSIS — K589 Irritable bowel syndrome without diarrhea: Secondary | ICD-10-CM

## 2013-01-13 DIAGNOSIS — C187 Malignant neoplasm of sigmoid colon: Secondary | ICD-10-CM

## 2013-01-13 DIAGNOSIS — R609 Edema, unspecified: Secondary | ICD-10-CM

## 2013-01-13 DIAGNOSIS — I82409 Acute embolism and thrombosis of unspecified deep veins of unspecified lower extremity: Secondary | ICD-10-CM

## 2013-01-13 LAB — CBC WITH DIFFERENTIAL/PLATELET
BASO%: 0.6 % (ref 0.0–2.0)
Basophils Absolute: 0 10e3/uL (ref 0.0–0.1)
EOS%: 1.4 % (ref 0.0–7.0)
Eosinophils Absolute: 0.1 10e3/uL (ref 0.0–0.5)
HCT: 32.2 % — ABNORMAL LOW (ref 34.8–46.6)
HGB: 10.8 g/dL — ABNORMAL LOW (ref 11.6–15.9)
LYMPH%: 35 % (ref 14.0–49.7)
MCH: 31.5 pg (ref 25.1–34.0)
MCHC: 33.7 g/dL (ref 31.5–36.0)
MCV: 93.3 fL (ref 79.5–101.0)
MONO#: 0.8 10e3/uL (ref 0.1–0.9)
MONO%: 11.7 % (ref 0.0–14.0)
NEUT#: 3.3 10e3/uL (ref 1.5–6.5)
NEUT%: 51.3 % (ref 38.4–76.8)
Platelets: 224 10e3/uL (ref 145–400)
RBC: 3.45 10e6/uL — ABNORMAL LOW (ref 3.70–5.45)
RDW: 23 % — ABNORMAL HIGH (ref 11.2–14.5)
WBC: 6.5 10e3/uL (ref 3.9–10.3)
lymph#: 2.3 10e3/uL (ref 0.9–3.3)

## 2013-01-13 NOTE — Progress Notes (Signed)
Patient ID: Erin Hansen, female   DOB: [birth year], 79 y.o.   MRN: [id number]  Chief Complaint:  Chief Complaint  Patient presents with  . Medical Managment of Chronic Issues     HPI:   Problem List Items Addressed This Visit     ICD-9-CM   Colon cancer - Primary      sigmoid, stage III, s/p laparoscopic colectomy and colostomy. Prn Percocet helped w/o drug reaction even if the patient has Codeine listed as drug allergy. Xeloda starts 11/18/12     Non-ST elevation MI (NSTEMI)      takes Metoprolol 25mg  bid. Had NSTEMI 10/2012      Liver hematoma     noted nodule per CT while in hospital-s/p biopsy--hematoma     Hypopotassemia     Kcl , serum K 4.5 10/24/12  And 3.9 11/07/12    Major depressive disorder, single episode, unspecified     staff thinks the patient is more depressed, but the patient denied, on Sertraline 75mg  and Melatonin 5mg  as needed to help rest at night.      Blepharitis, unspecified     chronic, Erythromycin oint     Irritable bowel syndrome      stable on Lactulose 40gm qhs and Docusate 100mg  qhs as needed     Osteoarthrosis, unspecified whether generalized or localized, unspecified site      multiple sites. Take Tylenol 1000mg  q4hrs prn and Norco prn    Degeneration of intervertebral disc, site unspecified      chronic lower back pain. Hx of veterbroplasty of T12     Edema      trace in BLE.    Unspecified urinary incontinence      chronic     Colostomy status     New--teaching the patient for colostomy care     Long term (current) use of anticoagulants      R+L PE and RLE DVT--Lovenox    Unspecified essential hypertension      takes Metoprolol 25mg  bid.      Unspecified vitamin D deficiency      stopped Cholecalciferol 1000units daily.    Anemia     continue to improve,  takes Fe 325mg , Hgb 9.2 10/03/12--Hgb 10.4 11/07/12       Review of Systems:  Review of Systems  Constitutional: Negative for fever, chills, weight loss,  malaise/fatigue and diaphoresis.  HENT: Positive for hearing loss. Negative for congestion, sore throat, neck pain and ear discharge.   Eyes: Negative for blurred vision, pain, discharge and redness.  Respiratory: Positive for cough. Negative for sputum production, shortness of breath and wheezing.   Cardiovascular: Negative for chest pain, palpitations, orthopnea, claudication, leg swelling and PND.  Gastrointestinal: Negative for heartburn, nausea, vomiting, abdominal pain, diarrhea, constipation and blood in stool.       Colostomy  Genitourinary: Positive for frequency (incontinent of bladder). Negative for dysuria, urgency and hematuria.  Musculoskeletal: Positive for back pain. Negative for myalgias, joint pain and falls.  Skin: Negative for itching and rash.  Neurological: Negative for dizziness, tingling, tremors, sensory change, speech change, focal weakness, seizures, loss of consciousness, weakness and headaches.  Endo/Heme/Allergies: Negative for environmental allergies and polydipsia. Does not bruise/bleed easily.  Psychiatric/Behavioral: Positive for depression and memory loss. Negative for hallucinations. The patient is nervous/anxious and has insomnia.      Medications: Patient's Medications  New Prescriptions   No medications on file  Previous Medications   ACETAMINOPHEN (TYLENOL) 500 MG TABLET  Take 1,000 mg by mouth every 4 (four) hours as needed for pain.   CAPECITABINE (XELODA) 500 MG TABLET    1500 mg every am and 1000 mg every pm X 14 days on, 7 days rest   DOCUSATE SODIUM (COLACE) 100 MG CAPSULE    Take 400 mg by mouth at bedtime as needed. For constipation   ENOXAPARIN (LOVENOX) 100 MG/ML INJECTION    Inject 1 mL (100 mg total) into the skin daily.   ERYTHROMYCIN OPHTHALMIC OINTMENT    Place 1 application into both eyes at bedtime.   FEEDING SUPPLEMENT (ENSURE COMPLETE) LIQD    Take 237 mLs by mouth 2 (two) times daily between meals.   FERROUS SULFATE 325 (65 FE)  MG TABLET    Take 325 mg by mouth daily with breakfast.    HYDROCODONE-ACETAMINOPHEN (NORCO/VICODIN) 5-325 MG PER TABLET    Take 1 tablet by mouth every 6 (six) hours as needed.   LACTULOSE (CHRONULAC) 10 GM/15ML SOLUTION    Take 40 g by mouth at bedtime.   MELATONIN 5 MG TABS    Take 5 mg by mouth at bedtime as needed. For sleep   METOPROLOL TARTRATE (LOPRESSOR) 25 MG TABLET    Take 1 tablet (25 mg total) by mouth 2 (two) times daily.   ONDANSETRON (ZOFRAN) 4 MG TABLET    Take 1 tablet (4 mg total) by mouth every 6 (six) hours.   POTASSIUM CHLORIDE SA (K-DUR,KLOR-CON) 20 MEQ TABLET    Take 20 mEq by mouth daily.    SERTRALINE (ZOLOFT) 50 MG TABLET    Take 100 mg by mouth every morning.   Modified Medications   No medications on file  Discontinued Medications   ALBUTEROL (PROVENTIL) (5 MG/ML) 0.5% NEBULIZER SOLUTION    Take 0.5 mLs (2.5 mg total) by nebulization every 6 (six) hours as needed for wheezing or shortness of breath.     Physical Exam: Physical Exam  Constitutional: She is oriented to person, place, and time. She appears well-developed and well-nourished.  HENT:  Head: Normocephalic and atraumatic.  Eyes: Conjunctivae and EOM are normal. Pupils are equal, round, and reactive to light.  Neck: Normal range of motion. Neck supple. No JVD present. No tracheal deviation present.  Cardiovascular: Normal rate and regular rhythm.   No murmur heard. Pulmonary/Chest: Effort normal and breath sounds normal. She has no wheezes. She has no rales.  Abdominal: Soft. Bowel sounds are normal. There is no tenderness.  Musculoskeletal: Normal range of motion. She exhibits no edema and no tenderness.  Lymphadenopathy:    She has no cervical adenopathy.  Neurological: She is alert and oriented to person, place, and time. She has normal reflexes. No cranial nerve deficit. She exhibits normal muscle tone. Coordination normal.  Skin: Skin is warm and dry. No rash noted.  Psychiatric: Her speech is  normal. She is not agitated, not aggressive, not slowed, not withdrawn and not actively hallucinating. Thought content is not paranoid and not delusional. She expresses inappropriate judgment. She does not express impulsivity. She exhibits a depressed mood. She exhibits abnormal recent memory.       Labs reviewed: Basic Metabolic Panel:  Recent Labs  11/91/47 0442 10/12/12 0435  11/15/12 0852 12/05/12 1000 12/23/12 1138  NA 136 140  < > 141 144 141  K 3.8 2.9*  < > 3.7 3.9 4.4  CL 107 106  < > 106 108* 107  CO2 20 24  < > 27 24 26   GLUCOSE 121*  121*  < > 114* 110* 109*  BUN 20 13  < > 14.8 18.4 17.9  CREATININE 0.76 0.75  < > 0.8 0.8 0.9  CALCIUM 8.3* 8.2*  < > 9.2 9.2 9.5  MG  --  1.3*  --   --   --   --   < > = values in this interval not displayed.  Liver Function Tests:  Recent Labs  11/15/12 0852 12/05/12 1000 12/23/12 1138  AST 11 12 13   ALT 15 10 13   ALKPHOS 82 68 76  BILITOT 0.81 0.27 0.52  PROT 6.5 6.3* 6.8  ALBUMIN 3.0* 2.8* 3.2*    CBC:  Recent Labs  12/05/12 1000 12/23/12 1138 01/13/13 1122  WBC 7.2 7.4 6.5  NEUTROABS 3.4 4.4 3.3  HGB 10.0* 10.0* 10.8*  HCT 30.2* 31.3* 32.2*  MCV 88.1 90.9 93.3  PLT 275 231 224    Anemia Panel:  Recent Labs  10/08/12 1600  IRON 17*  FOLATE 12.2  VITAMINB12 311    Significant Diagnostic Results:     Assessment/Plan Colon cancer  sigmoid, stage III, s/p laparoscopic colectomy and colostomy. Prn Percocet helped w/o drug reaction even if the patient has Codeine listed as drug allergy. Xeloda starts 11/18/12   Liver hematoma noted nodule per CT while in hospital-s/p biopsy--hematoma   Unspecified vitamin D deficiency  stopped Cholecalciferol 1000units daily.  Hypopotassemia Kcl , serum K 4.5 10/24/12  And 3.9 11/07/12  Anemia continue to improve,  takes Fe 325mg , Hgb 9.2 10/03/12--Hgb 10.4 11/07/12  Major depressive disorder, single episode, unspecified staff thinks the patient is more  depressed, but the patient denied, on Sertraline 75mg  and Melatonin 5mg  as needed to help rest at night.    Blepharitis, unspecified chronic, Erythromycin oint   Non-ST elevation MI (NSTEMI)  takes Metoprolol 25mg  bid. Had NSTEMI 10/2012    Irritable bowel syndrome  stable on Lactulose 40gm qhs and Docusate 100mg  qhs as needed   Osteoarthrosis, unspecified whether generalized or localized, unspecified site  multiple sites. Take Tylenol 1000mg  q4hrs prn and Norco prn  Degeneration of intervertebral disc, site unspecified  chronic lower back pain. Hx of veterbroplasty of T12   Edema  trace in BLE.  Unspecified urinary incontinence  chronic   Colostomy status New--teaching the patient for colostomy care   Long term (current) use of anticoagulants  R+L PE and RLE DVT--Lovenox  Unspecified essential hypertension  takes Metoprolol 25mg  bid.        Family/ staff Communication: colostomy care.    Goals of care: possible AL   Labs/tests ordered none   .

## 2013-01-13 NOTE — Progress Notes (Signed)
OFFICE PROGRESS NOTE  Interval history:  Erin Hansen returns as scheduled. She completed cycle 3 adjuvant Xeloda beginning 12/29/2012. She denies nausea/vomiting. No mouth sores. No diarrhea. No hand or foot pain or redness.   Objective: Blood pressure 127/56, pulse 61, temperature 97.8 F (36.6 C), temperature source Oral, resp. rate 18, height 5\' 7"  (1.702 m), weight 141 lb 8 oz (64.184 kg).  Oropharynx is without thrush or ulceration. Lungs are clear. Regular cardiac rhythm. Abdomen is soft and nontender. No hepatomegaly. Left abdomen ostomy. Extremities without edema. Palms are without erythema.  Lab Results: Lab Results  Component Value Date   WBC 6.5 01/13/2013   HGB 10.8* 01/13/2013   HCT 32.2* 01/13/2013   MCV 93.3 01/13/2013   PLT 224 01/13/2013    Chemistry:    Chemistry      Component Value Date/Time   NA 141 12/23/2012 1138   NA 140 10/14/2012 0440   K 4.4 12/23/2012 1138   K 3.5 10/14/2012 0440   CL 107 12/23/2012 1138   CL 108 10/14/2012 0440   CO2 26 12/23/2012 1138   CO2 23 10/14/2012 0440   BUN 17.9 12/23/2012 1138   BUN 11 10/14/2012 0440   CREATININE 0.9 12/23/2012 1138   CREATININE 0.58 10/14/2012 0440      Component Value Date/Time   CALCIUM 9.5 12/23/2012 1138   CALCIUM 8.2* 10/14/2012 0440   ALKPHOS 76 12/23/2012 1138   ALKPHOS 121* 10/08/2012 1600   AST 13 12/23/2012 1138   AST 36 10/08/2012 1600   ALT 13 12/23/2012 1138   ALT 35 10/08/2012 1600   BILITOT 0.52 12/23/2012 1138   BILITOT 0.4 10/08/2012 1600       Studies/Results: No results found.  Medications: I have reviewed the patient's current medications.  Assessment/Plan:  1. 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 She began cycle 1 adjuvant Xeloda 11/17/2012. 2. Normal preoperative CEA 09/15/2012 (2.8). 3. Hospitalization 10/09/2012 through 10/14/2012 with bilateral pulmonary emboli, right lower extremity DVT. Previously on Coumadin. Anticoagulation changed to Lovenox prior to beginning to Xeloda. 4. Diarrhea/rectal bleeding secondary to the sigmoid colon tumor. 5. Status post liver biopsy 09/14/2012 with pathology showing benign liver, negative for malignancy. 6. Liver hematoma on CT 10/08/2012 felt to likely be related to the liver biopsy.  Disposition-[name] appears stable. She has completed 3 cycles of Xeloda. Plan to proceed with cycle 4 as scheduled on 01/19/2013. She will return for a followup visit on 02/02/2013. She will contact the office in the interim with any problems.  Lonna Cobb ANP/GNP-BC

## 2013-01-14 DIAGNOSIS — E559 Vitamin D deficiency, unspecified: Secondary | ICD-10-CM | POA: Insufficient documentation

## 2013-01-14 DIAGNOSIS — D649 Anemia, unspecified: Secondary | ICD-10-CM | POA: Insufficient documentation

## 2013-01-14 NOTE — Assessment & Plan Note (Signed)
chronic, Erythromycin oint

## 2013-01-14 NOTE — Assessment & Plan Note (Signed)
takes Metoprolol 25mg  bid. Had NSTEMI 10/2012

## 2013-01-14 NOTE — Assessment & Plan Note (Signed)
sigmoid, stage III, s/p laparoscopic colectomy and colostomy. Prn Percocet helped w/o drug reaction even if the patient has Codeine listed as drug allergy. Xeloda starts 11/18/12

## 2013-01-14 NOTE — Assessment & Plan Note (Signed)
trace in BLE.

## 2013-01-14 NOTE — Assessment & Plan Note (Addendum)
staff thinks the patient is more depressed, but the patient denied, improved on Sertraline 100mg  and Melatonin 5mg  as needed to help rest at night. But still has difficulty coping her colostomy and anxiety when she was talked to about colostomy--may increase Sertraline to 125mg 

## 2013-01-14 NOTE — Assessment & Plan Note (Signed)
multiple sites. Take Tylenol 1000mg  q4hrs prn and Norco prn

## 2013-01-14 NOTE — Assessment & Plan Note (Signed)
continue to improve,  takes Fe 325mg , Hgb 9.2 10/03/12--Hgb 10.4 11/07/12

## 2013-01-14 NOTE — Assessment & Plan Note (Signed)
takes Metoprolol 25mg bid.         

## 2013-01-14 NOTE — Assessment & Plan Note (Signed)
New--teaching the patient for colostomy care

## 2013-01-14 NOTE — Assessment & Plan Note (Signed)
noted nodule per CT while in hospital-s/p biopsy--hematoma

## 2013-01-14 NOTE — Assessment & Plan Note (Signed)
stable on Lactulose 40gm qhs and Docusate 100mg  qhs as needed

## 2013-01-14 NOTE — Assessment & Plan Note (Signed)
R+L PE and RLE DVT--Lovenox

## 2013-01-14 NOTE — Assessment & Plan Note (Signed)
Kcl , serum K 4.5 10/24/12  And 3.9 11/07/12

## 2013-01-14 NOTE — Assessment & Plan Note (Signed)
chronic

## 2013-01-14 NOTE — Assessment & Plan Note (Signed)
stopped Cholecalciferol 1000units daily.

## 2013-01-14 NOTE — Assessment & Plan Note (Signed)
chronic lower back pain. Hx of veterbroplasty of T12

## 2013-01-25 NOTE — Telephone Encounter (Signed)
Called patient on 11/18/12 to discuss new oral chemotherapy medication. She received the medication yesterday on 11/17/12 and She took 2 tabs on 3/13 after dinner and 3 this morning. She is on 14 day regimen with 7 off. I reviewed the xeloda medication information with her over the phone, counseling on administration, dosing, schedule, safe handling, side effects, self monitoring, interactions, and disposal. All questions were answered. She was given my office number [phone]) incase she has any other questions or concerns. Will continue to follow-up and monitor. Will call in the next week to assess toxicity, adherence, and questions. Thank you,   Christell Faith, PharmD

## 2013-02-03 ENCOUNTER — Telehealth: Payer: Self-pay | Admitting: Oncology

## 2013-02-03 ENCOUNTER — Other Ambulatory Visit (HOSPITAL_BASED_OUTPATIENT_CLINIC_OR_DEPARTMENT_OTHER): Payer: Medicare Other | Admitting: Lab

## 2013-02-03 ENCOUNTER — Ambulatory Visit (HOSPITAL_BASED_OUTPATIENT_CLINIC_OR_DEPARTMENT_OTHER): Payer: Medicare Other | Admitting: Oncology

## 2013-02-03 ENCOUNTER — Encounter (INDEPENDENT_AMBULATORY_CARE_PROVIDER_SITE_OTHER): Payer: Medicare Other | Admitting: General Surgery

## 2013-02-03 DIAGNOSIS — C189 Malignant neoplasm of colon, unspecified: Secondary | ICD-10-CM

## 2013-02-03 DIAGNOSIS — C187 Malignant neoplasm of sigmoid colon: Secondary | ICD-10-CM

## 2013-02-03 LAB — CBC WITH DIFFERENTIAL/PLATELET
BASO%: 0.9 % (ref 0.0–2.0)
Basophils Absolute: 0.1 10*3/uL (ref 0.0–0.1)
EOS%: 1.6 % (ref 0.0–7.0)
HGB: 10.7 g/dL — ABNORMAL LOW (ref 11.6–15.9)
MCH: 32.3 pg (ref 25.1–34.0)
MCHC: 34.1 g/dL (ref 31.5–36.0)
MONO#: 0.7 10*3/uL (ref 0.1–0.9)
RDW: 23.4 % — ABNORMAL HIGH (ref 11.2–14.5)
WBC: 6.2 10*3/uL (ref 3.9–10.3)
lymph#: 2.3 10*3/uL (ref 0.9–3.3)

## 2013-02-03 LAB — COMPREHENSIVE METABOLIC PANEL (CC13)
ALT: 11 U/L (ref 0–55)
AST: 12 U/L (ref 5–34)
Albumin: 3.2 g/dL — ABNORMAL LOW (ref 3.5–5.0)
Calcium: 9.3 mg/dL (ref 8.4–10.4)
Chloride: 109 mEq/L — ABNORMAL HIGH (ref 98–107)
Potassium: 4.1 mEq/L (ref 3.5–5.1)

## 2013-02-03 NOTE — Telephone Encounter (Signed)
Gave pt appt for june 2014 with ML with labs

## 2013-02-03 NOTE — Progress Notes (Signed)
   [facility]    OFFICE PROGRESS NOTE   INTERVAL HISTORY:   She returns as scheduled. She completed another cycle of Xeloda beginning on 01/19/2013. No mouth sores, nausea, or diarrhea. No hand or foot pain.  Objective:  Vital signs in last 24 hours:  There were no vitals taken for this visit.    HEENT: No thrush or ulcer Resp: Lungs clear bilaterally Cardio: Regular rate and rhythm GI: No hepatomegaly, left lower quadrant ostomy Vascular: No leg edema  Skin: Hands without erythema or skin breakdown     Lab Results:  Lab Results  Component Value Date   WBC 6.2 02/03/2013   HGB 10.7* 02/03/2013   HCT 31.2* 02/03/2013   MCV 94.7 02/03/2013   PLT 224 02/03/2013   ANC 3.0  Medications: I have reviewed the patient's current medications.  Assessment/Plan: 1. 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 She began cycle 1 adjuvant Xeloda 11/17/2012. 2. Normal preoperative CEA 09/15/2012 (2.8). 3. Hospitalization 10/09/2012 through 10/14/2012 with bilateral pulmonary emboli, right lower extremity DVT. Previously on Coumadin. Anticoagulation changed to Lovenox prior to beginning to Xeloda. 4. Diarrhea/rectal bleeding secondary to the sigmoid colon tumor. 5. Status post liver biopsy 09/14/2012 with pathology showing benign liver, negative for malignancy. 6. Liver hematoma on CT 10/08/2012 felt to likely be related to the liver biopsy.   Disposition:  She has completed 4 cycles of adjuvant Xeloda. Ms. Stevphen Rochester continues to tolerate the Xeloda well. She will begin cycle 5 on 02/09/2013. We noted on the medication list from the nursing  center that she has been taking Zofran on a running schedule. She has no nausea. We changed the  Zofran to as needed. She will return for an office visit on 02/27/2013.   Thornton Papas, MD  02/03/2013  1:06 PM

## 2013-02-24 ENCOUNTER — Encounter (INDEPENDENT_AMBULATORY_CARE_PROVIDER_SITE_OTHER): Payer: Medicare Other | Admitting: General Surgery

## 2013-02-27 ENCOUNTER — Other Ambulatory Visit (HOSPITAL_BASED_OUTPATIENT_CLINIC_OR_DEPARTMENT_OTHER): Payer: Medicare Other | Admitting: Lab

## 2013-02-27 ENCOUNTER — Telehealth: Payer: Self-pay | Admitting: Oncology

## 2013-02-27 ENCOUNTER — Ambulatory Visit (HOSPITAL_BASED_OUTPATIENT_CLINIC_OR_DEPARTMENT_OTHER): Payer: Medicare Other | Admitting: Nurse Practitioner

## 2013-02-27 VITALS — BP 120/71 | HR 61 | Temp 97.0°F | Resp 20 | Ht 67.0 in | Wt 140.8 lb

## 2013-02-27 DIAGNOSIS — C189 Malignant neoplasm of colon, unspecified: Secondary | ICD-10-CM

## 2013-02-27 LAB — CBC WITH DIFFERENTIAL/PLATELET
Eosinophils Absolute: 0.1 10*3/uL (ref 0.0–0.5)
MCV: 98.6 fL (ref 79.5–101.0)
MONO%: 9.4 % (ref 0.0–14.0)
NEUT#: 3.2 10*3/uL (ref 1.5–6.5)
RBC: 3.01 10*6/uL — ABNORMAL LOW (ref 3.70–5.45)
RDW: 22 % — ABNORMAL HIGH (ref 11.2–14.5)
WBC: 5.8 10*3/uL (ref 3.9–10.3)

## 2013-02-27 NOTE — Progress Notes (Signed)
OFFICE PROGRESS NOTE  Interval history:  Erin Hansen returns as scheduled. She completed cycle 5 adjuvant Xeloda beginning 02/09/2013. She denies nausea/vomiting. No mouth sores. No diarrhea. No hand or foot pain or redness.   Objective: Blood pressure 120/71, pulse 61, temperature 97 F (36.1 C), temperature source Oral, resp. rate 20, height 5\' 7"  (1.702 m), weight 140 lb 12.8 oz (63.866 kg).  Oropharynx is without thrush or ulceration. Lungs are clear. Regular cardiac rhythm. Abdomen is soft. No hepatomegaly. Left lower quadrant ostomy. Extremities without edema. Palms without erythema or skin breakdown.  Lab Results: Lab Results  Component Value Date   WBC 5.8 02/27/2013   HGB 10.1* 02/27/2013   HCT 29.7* 02/27/2013   MCV 98.6 02/27/2013   PLT 219 02/27/2013    Chemistry:    Chemistry      Component Value Date/Time   NA 142 02/03/2013 1146   NA 140 11/07/2012   NA 140 10/14/2012 0440   K 4.1 02/03/2013 1146   K 3.9 11/07/2012   CL 109* 02/03/2013 1146   CL 108 10/14/2012 0440   CO2 25 02/03/2013 1146   CO2 23 10/14/2012 0440   BUN 19.7 02/03/2013 1146   BUN 21 11/07/2012   BUN 11 10/14/2012 0440   CREATININE 0.9 02/03/2013 1146   CREATININE 0.8 11/07/2012   CREATININE 0.58 10/14/2012 0440   GLU 104 11/07/2012      Component Value Date/Time   CALCIUM 9.3 02/03/2013 1146   CALCIUM 8.2* 10/14/2012 0440   ALKPHOS 80 02/03/2013 1146   ALKPHOS 121* 10/08/2012 1600   AST 12 02/03/2013 1146   AST 36 10/08/2012 1600   ALT 11 02/03/2013 1146   ALT 35 10/08/2012 1600   BILITOT 0.57 02/03/2013 1146   BILITOT 0.4 10/08/2012 1600       Studies/Results: No results found.  Medications: I have reviewed the patient's current medications.  Assessment/Plan:  1. 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 She began cycle 1 adjuvant Xeloda 11/17/2012. 2. Normal preoperative CEA 09/15/2012 (2.8). 3. Hospitalization 10/09/2012 through 10/14/2012 with bilateral pulmonary emboli, right lower extremity DVT. Previously on Coumadin. Anticoagulation changed to Lovenox prior to beginning to Xeloda. 4. Diarrhea/rectal bleeding secondary to the sigmoid colon tumor. 5. Status post liver biopsy 09/14/2012 with pathology showing benign liver, negative for malignancy. 6. Liver hematoma on CT 10/08/2012 felt to likely be related to the liver biopsy.  Disposition-Erin Hansen appears stable. She has completed 5 cycles of adjuvant Xeloda. Plan to proceed with cycle 6 as scheduled on 03/02/2013. She would be scheduled to begin cycle 7 on 03/23/2013. At the request of her son she will return for a followup visit on 03/24/2013 with plans to proceed with cycle 7 Xeloda that day if otherwise stable. She will contact the office prior to her next visit with any problems.  Plan reviewed with Dr. Truett Perna.  Lonna Cobb ANP/GNP-BC

## 2013-03-14 ENCOUNTER — Encounter: Payer: Self-pay | Admitting: Internal Medicine

## 2013-03-21 ENCOUNTER — Encounter: Payer: Self-pay | Admitting: Internal Medicine

## 2013-03-21 ENCOUNTER — Non-Acute Institutional Stay: Payer: Medicare Other | Admitting: Internal Medicine

## 2013-03-21 VITALS — BP 124/68 | HR 64 | Temp 98.1°F | Ht 66.0 in | Wt 142.0 lb

## 2013-03-21 DIAGNOSIS — R413 Other amnesia: Secondary | ICD-10-CM

## 2013-03-21 DIAGNOSIS — I214 Non-ST elevation (NSTEMI) myocardial infarction: Secondary | ICD-10-CM

## 2013-03-21 DIAGNOSIS — C189 Malignant neoplasm of colon, unspecified: Secondary | ICD-10-CM

## 2013-03-21 DIAGNOSIS — Z7901 Long term (current) use of anticoagulants: Secondary | ICD-10-CM

## 2013-03-21 DIAGNOSIS — Z933 Colostomy status: Secondary | ICD-10-CM

## 2013-03-24 ENCOUNTER — Ambulatory Visit (HOSPITAL_BASED_OUTPATIENT_CLINIC_OR_DEPARTMENT_OTHER): Payer: Medicare Other | Admitting: Nurse Practitioner

## 2013-03-24 ENCOUNTER — Other Ambulatory Visit (HOSPITAL_BASED_OUTPATIENT_CLINIC_OR_DEPARTMENT_OTHER): Payer: Medicare Other | Admitting: Lab

## 2013-03-24 VITALS — BP 122/78 | HR 59 | Temp 97.6°F | Resp 20 | Ht 66.0 in | Wt 144.2 lb

## 2013-03-24 DIAGNOSIS — C189 Malignant neoplasm of colon, unspecified: Secondary | ICD-10-CM

## 2013-03-24 LAB — CBC WITH DIFFERENTIAL/PLATELET
Eosinophils Absolute: 0.1 10*3/uL (ref 0.0–0.5)
LYMPH%: 35.5 % (ref 14.0–49.7)
MONO#: 0.6 10*3/uL (ref 0.1–0.9)
NEUT#: 2.8 10*3/uL (ref 1.5–6.5)
Platelets: 228 10*3/uL (ref 145–400)
RBC: 2.95 10*6/uL — ABNORMAL LOW (ref 3.70–5.45)
RDW: 18.8 % — ABNORMAL HIGH (ref 11.2–14.5)
WBC: 5.5 10*3/uL (ref 3.9–10.3)

## 2013-03-24 LAB — COMPREHENSIVE METABOLIC PANEL (CC13)
Albumin: 3.2 g/dL — ABNORMAL LOW (ref 3.5–5.0)
CO2: 25 mEq/L (ref 22–29)
Chloride: 109 mEq/L (ref 98–109)
Glucose: 120 mg/dl (ref 70–140)
Potassium: 4.4 mEq/L (ref 3.5–5.1)
Sodium: 142 mEq/L (ref 136–145)
Total Protein: 6.3 g/dL — ABNORMAL LOW (ref 6.4–8.3)

## 2013-03-24 NOTE — Progress Notes (Signed)
OFFICE PROGRESS NOTE  Interval history:  Erin Hansen returns as scheduled. She began cycle 7 of adjuvant Xeloda 03/23/2013. She denies nausea/vomiting. No mouth sores. No hand or foot pain or redness. She has had some loose stools recently. Stool earlier today was formed. She estimates emptying the colostomy bag 1 time per day.   Objective: Blood pressure 122/78, pulse 59, temperature 97.6 F (36.4 C), temperature source Oral, resp. rate 20, height 5\' 6"  (1.676 m), weight 144 lb 3.2 oz (65.409 kg).  Oropharynx is without thrush or ulceration. Lungs are clear. Regular cardiac rhythm. Abdomen is soft and nontender. No hepatomegaly. Left lower quadrant ostomy. Small formed pieces of stool in the collection bag. No leg edema. Palms without erythema or skin breakdown.  Lab Results: Lab Results  Component Value Date   WBC 5.5 03/24/2013   HGB 10.2* 03/24/2013   HCT 30.4* 03/24/2013   MCV 103.0* 03/24/2013   PLT 228 03/24/2013    Chemistry:    Chemistry      Component Value Date/Time   NA 142 03/24/2013 1304   NA 140 11/07/2012   NA 140 10/14/2012 0440   K 4.4 03/24/2013 1304   K 3.9 11/07/2012   CL 109* 02/03/2013 1146   CL 108 10/14/2012 0440   CO2 25 03/24/2013 1304   CO2 23 10/14/2012 0440   BUN 23.2 03/24/2013 1304   BUN 21 11/07/2012   BUN 11 10/14/2012 0440   CREATININE 0.9 03/24/2013 1304   CREATININE 0.8 11/07/2012   CREATININE 0.58 10/14/2012 0440   GLU 104 11/07/2012      Component Value Date/Time   CALCIUM 9.1 03/24/2013 1304   CALCIUM 8.2* 10/14/2012 0440   ALKPHOS 68 03/24/2013 1304   ALKPHOS 121* 10/08/2012 1600   AST 12 03/24/2013 1304   AST 36 10/08/2012 1600   ALT 12 03/24/2013 1304   ALT 35 10/08/2012 1600   BILITOT 0.52 03/24/2013 1304   BILITOT 0.4 10/08/2012 1600       Studies/Results: No results found.  Medications: I have reviewed the patient's current medications.  Assessment/Plan:  1. 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 She began cycle 1 adjuvant Xeloda 11/17/2012. 2. Normal preoperative CEA 09/15/2012 (2.8). 3. Hospitalization 10/09/2012 through 10/14/2012 with bilateral pulmonary emboli, right lower extremity DVT. Previously on Coumadin. Anticoagulation changed to Lovenox prior to beginning to Xeloda. Per review of medication list from the nursing facility current anticoagulation is Xarelto. 4. Diarrhea/rectal bleeding secondary to the sigmoid colon tumor. 5. Status post liver biopsy 09/14/2012 with pathology showing benign liver, negative for malignancy. 6. Liver hematoma on CT 10/08/2012 felt to likely be related to the liver biopsy.  Disposition-Erin Hansen appears stable. She has completed 6 cycles of adjuvant Xeloda. She began cycle 7 on 03/23/2013. She will return for a followup visit on 04/10/2013.  She will contact the office prior to her next visit with any problems.        We will request the Xarelto dose be confirmed with Dr. Chilton Si.  Plan reviewed with Dr. Truett Perna.  Lonna Cobb ANP/GNP-BC

## 2013-03-26 DIAGNOSIS — R413 Other amnesia: Secondary | ICD-10-CM | POA: Insufficient documentation

## 2013-03-26 NOTE — Progress Notes (Signed)
Subjective:    Patient ID: Erin Hansen, female    DOB: May 18, 1926, 79 y.o.   MRN: [id number]  HPI 68 year old resident of [facility] currently residing in the assisted living area. Seen today for "increased confusion".  Last seen 02/27/2013 by Dr. 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 She began cycle 1 adjuvant Xeloda 11/17/2012. Normal preoperative CEA 09/15/2012 (2.8)).     Currently receiving Xeloda chemotherapy. She remains anemic and quite weak. Reports from staff suggested she has been mentioning that she would like to drive again. Her family as well as staff have major concerns about this.  On 03/19/2013 she was found going into 2 different rooms looking for her bathroom. She reportedly is consistently confused. She often turns the wrong way when coming out of her room or the dining room.  Patient has mentioned that she may want to return to her apartment. Her family is not in favor of this. Patient has a colostomy as result of surgery for her cancer of the colon. She is not independent in maintenance of the colostomy.  Her most recent MMSE was a total score of 26/30 she was unable to recall any of 3 items. She did pass the clock drawing test.  Patient states that there has been some leakage at the site of her colostomy. She does have a ventral hernia which has developed at the site of the colostomy. This may be contributing to the leakage, however I am not sure of this, and there are no signs of leakage on today's exam.  Patient expresses a concern about the shots she is  getting daily. She is on enoxaparin. As best I can determine this was started following her surgery. She had a pulmonary embolism February 2014, which would justify continuing anticoagulation.  Hospitalization 10/09/2012 through 10/14/2012 with bilateral pulmonary emboli, right lower extremity DVT. Previously on Coumadin. Anticoagulation changed to Lovenox prior to beginning the Xeloda.  Patient remains anemic. 02/27/2013 her hemoglobin was 10.1.  Current Outpatient Prescriptions on File Prior to Visit  Medication Sig Dispense Refill  . acetaminophen (TYLENOL) 500 MG tablet Take 1,000 mg by mouth every 4 (four) hours as needed for pain.      Marland Kitchen antiseptic oral rinse (BIOTENE) LIQD 15 mLs by Mouth Rinse route as needed.      . capecitabine (XELODA) 500 MG tablet 1500 mg every am and 1000 mg every pm X 14 days on, 7 days rest  70 tablet  0  . docusate sodium (COLACE) 100 MG capsule Take 400 mg by mouth at bedtime as needed. For constipation      . ENULOSE 10 GM/15ML SOLN 40 g daily.       Marland Kitchen erythromycin ophthalmic ointment Place 1 application into both eyes at bedtime.      . feeding supplement (ENSURE COMPLETE) LIQD Take 237 mLs by mouth 2 (two) times daily between meals.      . ferrous sulfate 325 (65 FE) MG tablet Take 325 mg by mouth daily with breakfast.       . HYDROcodone-acetaminophen (NORCO/VICODIN) 5-325 MG per tablet Take 1 tablet by mouth every 6 (six) hours as needed.  30  tablet  0  . Melatonin 5 MG TABS Take 5 mg by mouth at bedtime as needed. For sleep      . metoprolol tartrate (LOPRESSOR) 25 MG tablet Take 1 tablet (25 mg total) by mouth 2 (two) times daily.      . ondansetron (ZOFRAN) 4 MG tablet Take 1 tablet (4 mg total) by mouth every 6 (six) hours.  20 tablet  0  . sertraline (ZOLOFT) 50 MG tablet Take 100 mg by mouth every morning.             Review of Systems  Constitutional: Positive for fatigue. Negative for fever, chills, diaphoresis, activity change and appetite  change.  HENT: Negative for hearing loss, congestion, rhinorrhea, trouble swallowing, neck pain, neck stiffness, voice change, postnasal drip and sinus pressure.   Eyes: Negative for pain, discharge, itching and visual disturbance.  Respiratory: Positive for shortness of breath (on exertion ). Negative for cough, choking, chest tightness and wheezing.   Cardiovascular: Positive for leg swelling (trace). Negative for chest pain and palpitations.  Gastrointestinal: Negative for nausea, vomiting, abdominal pain, diarrhea, constipation and abdominal distention.       History of colon cancer. Colostomy present. Under chemotherapy with Xeloda.  Endocrine: Negative.   Genitourinary: Negative for dysuria, urgency, frequency, flank pain and pelvic pain.  Musculoskeletal: Positive for back pain, arthralgias and gait problem. Negative for myalgias and joint swelling.  Skin: Negative for rash.  Allergic/Immunologic: Negative.   Neurological: Negative for tremors, seizures, speech difficulty, weakness, light-headedness, numbness and headaches.  Hematological: Negative.   Psychiatric/Behavioral: Positive for confusion (occasinally). Negative for hallucinations, behavioral problems, sleep disturbance, dysphoric mood, decreased concentration and agitation. The patient is not nervous/anxious.        Objective:BP 124/68  Pulse 64  Temp(Src) 98.1 F (36.7 C) (Oral)  Ht 5\' 6"  (1.676 m)  Wt 142 lb (64.411 kg)  BMI 22.93 kg/m2    Physical Exam  Constitutional: She is oriented to person, place, and time. She appears well-developed and well-nourished.  HENT:  Head: Normocephalic and atraumatic.  Eyes: Conjunctivae and EOM are normal. Pupils are equal, round, and reactive to light.  Neck: Normal range of motion. Neck supple. No JVD present. No tracheal deviation present.  Cardiovascular: Normal rate and regular rhythm.   No murmur heard. Pulmonary/Chest: Effort normal and breath sounds normal. She has no  wheezes. She has no rales.  Abdominal: Soft. Bowel sounds are normal. There is no tenderness.  Large ventral hernia at the site of the colostomy. Nontender.  Musculoskeletal: Normal range of motion. She exhibits no edema and no tenderness.  Lymphadenopathy:    She has no cervical adenopathy.  Neurological: She is alert and oriented to person, place, and time. She has normal reflexes. No cranial nerve deficit. She exhibits normal muscle tone. Coordination normal.  Mild to moderate memory loss and confusion.  Skin: Skin is warm and dry. No rash noted.  Psychiatric: Her speech is normal. She is not agitated, not aggressive, not slowed, not withdrawn and not actively hallucinating. Thought content is not paranoid and not delusional. She expresses inappropriate judgment. She does not express impulsivity. She exhibits a depressed mood. She exhibits abnormal recent memory.          Assessment & Plan:  Memory loss This is a new problem. I think this is likely to be a chronic issue which will potentially worsen over time. This patient has undergone a number of significant medical issues in the last 6 months,  including major surgery for her cancer, chemotherapy, pulmonary embolus, DVT, and myocardial infarction. All of these things could contribute to loss of memory in this frail individual. This is my first opportunity to meet her and assess this problem. We will continue to review it at each visit.  Colon cancer Continues with Xeloda.  Non-ST elevation MI (NSTEMI) Denies any angina or palpitations.  Colostomy status Patient complains of leakage. She has developed a ventral hernia at the site of the colostomy. There is no pain, rash, or bleeding.  Long term (current) use of anticoagulants Because of her complaints of the daily shots of Lovenox, who changed her to Xarelto 15 mg daily.

## 2013-03-26 NOTE — Patient Instructions (Signed)
Recommend no driving and continued stay in the assisted-living area.

## 2013-04-03 ENCOUNTER — Encounter (INDEPENDENT_AMBULATORY_CARE_PROVIDER_SITE_OTHER): Payer: Self-pay | Admitting: General Surgery

## 2013-04-03 ENCOUNTER — Ambulatory Visit (INDEPENDENT_AMBULATORY_CARE_PROVIDER_SITE_OTHER): Payer: Medicare Other | Admitting: General Surgery

## 2013-04-03 ENCOUNTER — Telehealth (INDEPENDENT_AMBULATORY_CARE_PROVIDER_SITE_OTHER): Payer: Self-pay

## 2013-04-03 VITALS — BP 118/72 | HR 70 | Resp 16 | Ht 67.0 in | Wt 139.6 lb

## 2013-04-03 DIAGNOSIS — K9409 Other complications of colostomy: Secondary | ICD-10-CM

## 2013-04-03 DIAGNOSIS — C189 Malignant neoplasm of colon, unspecified: Secondary | ICD-10-CM

## 2013-04-03 DIAGNOSIS — IMO0002 Reserved for concepts with insufficient information to code with codable children: Secondary | ICD-10-CM

## 2013-04-03 NOTE — Progress Notes (Signed)
Subjective:     Patient ID: Erin Hansen, female   DOB: Oct 01, 1925, 79 y.o.   MRN: [id number]  HPI This is an 79 year old female who presented with a sigmoid colon cancer. She was taken to the operating room and underwent a laparoscopic sigmoid colectomy with an end colostomy to the fracture he had incontinence issues preoperatively. Her pathology shows a stage III  6 cm colorectal adenocarcinoma focally extending into pericolonic connective tissue; negative margins; lymphovascular invasion present; no perineural invasion; one of 15 lymph nodes positive for metastatic carcinoma (pT3, pN1a). She is maintained on some low-dose xeloda. She also has had bilateral pulmonary emboli postoperatively. [facility] is [facility]. She is at several experience with leaking of her bag. She is the primary caretaker for bag and has not seen an enterostomal therapist anytime recently it sounds like. She comes back in today with some memory loss according to her prior notes. She also has a bulge near her colostomy site. Her colostomy is functioning well and she has no other complaints today.   Review of Systems     Objective:   Physical Exam Reducible parastomal hernia, colostomy functioning well. There is some leakage inferiorly that is related to the appliance not being secure.  She has a good seal around it.  Abdomen nontender    Assessment:     Stage III colon cancer Parastomal hernia with colostomy     Plan:     She is being cared for by Dr. Truett Perna for colon cancer. I think otherwise she is doing as expected. I think the issues with her colostomy her likely secondary to her inability to place this appropriately. I think that she would benefit from some additional nursing care to help her with the changing of these bags. We are also going to refer her to the ostomy nurse to see if there is anything else in addition to help with that. She does have a peristomal hernia which I told her  that I do not think we should repair given her course with the last procedure. We discussed that there is a small chance that she would need something in an emergency for that. I think it is very reasonable just to monitor this for now though.

## 2013-04-03 NOTE — Telephone Encounter (Signed)
Called to make an appt with Page Spiro the wound-ostomy nurse with [facility] for the pt to have an evaluation of the leaking ostomy. The pt needs to be fitted for a ostomy belt. I made her an appt with Olegario Messier for 04/05/13 at 11:00. I called [facility] to give them the appt date and time so they could arrange for the pt to get to the appt. I spoke to pt's nurse Sallee Provencal who will contact the pt's family so they can go with the pt. I notified pt and she understands.

## 2013-04-10 ENCOUNTER — Other Ambulatory Visit (HOSPITAL_BASED_OUTPATIENT_CLINIC_OR_DEPARTMENT_OTHER): Payer: Medicare Other | Admitting: Lab

## 2013-04-10 ENCOUNTER — Ambulatory Visit (HOSPITAL_BASED_OUTPATIENT_CLINIC_OR_DEPARTMENT_OTHER): Payer: Medicare Other | Admitting: Oncology

## 2013-04-10 ENCOUNTER — Telehealth: Payer: Self-pay | Admitting: Oncology

## 2013-04-10 VITALS — BP 126/67 | HR 61 | Temp 97.1°F | Resp 18 | Ht 67.0 in | Wt 141.7 lb

## 2013-04-10 DIAGNOSIS — C187 Malignant neoplasm of sigmoid colon: Secondary | ICD-10-CM

## 2013-04-10 DIAGNOSIS — Z86718 Personal history of other venous thrombosis and embolism: Secondary | ICD-10-CM

## 2013-04-10 DIAGNOSIS — Z86711 Personal history of pulmonary embolism: Secondary | ICD-10-CM

## 2013-04-10 DIAGNOSIS — C189 Malignant neoplasm of colon, unspecified: Secondary | ICD-10-CM

## 2013-04-10 DIAGNOSIS — Z7901 Long term (current) use of anticoagulants: Secondary | ICD-10-CM

## 2013-04-10 LAB — COMPREHENSIVE METABOLIC PANEL (CC13)
ALT: 11 U/L (ref 0–55)
AST: 12 U/L (ref 5–34)
Chloride: 112 mEq/L — ABNORMAL HIGH (ref 98–109)
Creatinine: 0.9 mg/dL (ref 0.6–1.1)
Sodium: 144 mEq/L (ref 136–145)
Total Bilirubin: 0.75 mg/dL (ref 0.20–1.20)

## 2013-04-10 LAB — CBC WITH DIFFERENTIAL/PLATELET
BASO%: 1 % (ref 0.0–2.0)
EOS%: 1.6 % (ref 0.0–7.0)
HCT: 30.7 % — ABNORMAL LOW (ref 34.8–46.6)
LYMPH%: 44.7 % (ref 14.0–49.7)
MCH: 35.5 pg — ABNORMAL HIGH (ref 25.1–34.0)
MCHC: 34.5 g/dL (ref 31.5–36.0)
NEUT%: 43.7 % (ref 38.4–76.8)
RBC: 2.99 10*6/uL — ABNORMAL LOW (ref 3.70–5.45)
lymph#: 3.2 10*3/uL (ref 0.9–3.3)

## 2013-04-10 NOTE — Progress Notes (Signed)
   [facility]    OFFICE PROGRESS NOTE   INTERVAL HISTORY:   She returns as scheduled. She completed cycle 7 of Xeloda beginning on 03/23/2013. She tolerated the chemotherapy well. No mouth sores, nausea, or diarrhea. No hand or foot pain. She is now taking Xarelto for anticoagulation therapy.  Objective:  Vital signs in last 24 hours:  Blood pressure 126/67, pulse 61, temperature 97.1 F (36.2 C), temperature source Oral, resp. rate 18, height 5\' 7"  (1.702 m), weight 141 lb 11.2 oz (64.275 kg).    HEENT: No thrush or ulcer Resp: Lungs clear bilaterally Cardio: Regular rate and rhythm GI: No hepatomegaly, no stool in the left lower quadrant ostomy bag Vascular: No leg edema  Skin: Minimal erythema over the soles, no palmar erythema. No skin breakdown.   Lab Results:  Lab Results  Component Value Date   WBC 7.1 04/10/2013   HGB 10.6* 04/10/2013   HCT 30.7* 04/10/2013   MCV 102.7* 04/10/2013   PLT 224 04/10/2013   ANC 3.1    Medications: I have reviewed the patient's current medications.  Assessment/Plan: 1. 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 She began cycle 1 adjuvant Xeloda 11/17/2012. 2. Normal preoperative CEA 09/15/2012 (2.8). 3. Hospitalization 10/09/2012 through 10/14/2012 with bilateral pulmonary emboli, right lower extremity DVT. Previously on Coumadin. Anticoagulation changed to Lovenox prior to beginning to Xeloda. Now maintained on  Xarelto. 4. History of Diarrhea/rectal bleeding secondary to the sigmoid colon tumor. 5. Status post liver biopsy 09/14/2012 with pathology showing benign liver, negative for  malignancy. 6. Liver hematoma on CT 10/08/2012 felt to likely be related to the liver biopsy.  Disposition:  She continues to tolerate the chemotherapy well. She will complete the final cycle of Xeloda beginning on 04/13/2013. She will return for a lab visit in 2 months. We will see her for an office visit in January of 2015.    Thornton Papas, MD  04/10/2013  4:04 PM

## 2013-04-10 NOTE — Telephone Encounter (Signed)
gv and printed appt sched and avs for pt  °

## 2013-04-18 ENCOUNTER — Non-Acute Institutional Stay: Payer: Medicare Other | Admitting: Internal Medicine

## 2013-04-18 ENCOUNTER — Encounter: Payer: Self-pay | Admitting: Internal Medicine

## 2013-04-18 VITALS — BP 136/64 | HR 62 | Ht 67.0 in | Wt 142.0 lb

## 2013-04-18 DIAGNOSIS — Z933 Colostomy status: Secondary | ICD-10-CM

## 2013-04-18 DIAGNOSIS — Z7901 Long term (current) use of anticoagulants: Secondary | ICD-10-CM

## 2013-04-18 DIAGNOSIS — R413 Other amnesia: Secondary | ICD-10-CM

## 2013-04-18 DIAGNOSIS — D649 Anemia, unspecified: Secondary | ICD-10-CM

## 2013-04-18 DIAGNOSIS — I10 Essential (primary) hypertension: Secondary | ICD-10-CM

## 2013-04-18 DIAGNOSIS — C189 Malignant neoplasm of colon, unspecified: Secondary | ICD-10-CM

## 2013-04-18 DIAGNOSIS — I2699 Other pulmonary embolism without acute cor pulmonale: Secondary | ICD-10-CM

## 2013-04-18 NOTE — Patient Instructions (Signed)
Continue current medications. 

## 2013-04-18 NOTE — Progress Notes (Signed)
Failed clock drawing  

## 2013-04-18 NOTE — Progress Notes (Signed)
Patient ID: DAESHA INSCO, female   DOB: 11/11/1925, 79 y.o.   MRN: [id number] MRN: [id number] Name: Erin Hansen  Sex: female Age: 79 y.o. DOB: 1926-04-09  Facility/Room: [facility], AL 15 Level Of Care: AL Provider: Kimber [facility] Emergency Contacts: Extended Emergency Contact Information Primary Emergency Contact: Sharyn Creamer of [place] Home Phone: [phone] Mobile Phone: [phone] Relation: Son Secondary Emergency Contact: Ross,Jenny Address: [address]          [place], [place] [address] Darden Amber of [place] Home Phone: [phone] Mobile Phone: [phone] Relation: Friend  Code Status: LIVING WILL, DNR  Allergies: Codeine  Chief Complaint  Patient presents with  . Medical Managment of Chronic Issues    Comphrensive exam: blood pressure, anemia, depression, memory    HPI: Patient is 79 y.o. female who was initially admitted to SNF following hospitalization in jan 2014. She progressed with PT and OT and was moved to AL 01/26/13. She continues on chemotherapy for her colon cancer. Getting final cycle of Xeloda starting 04/13/13. Sees Dr. Truett Perna.  09/10/12-09/27/12 hospital for sigmoid colon cancer stage III--underwent laparoscopic sigmoid colectomy with colostomy--resolved her ileus post op, acute on chronic blood loss anemia, hypokalemia.  Queasy feeling after eating breakfast in the morning. Vomited twice in her room about 2 weeks ago. Has not reoccurred.   Has parastomal ventral hernia. Saw Dr. Dwain Sarna 04/03/13. He thinks she needs more nursing supervision when changing the bag.  Anemia is stable.  She would still like to drive. Misses the independence.   Past Medical History  Diagnosis Date  . DJD (degenerative joint disease)   . Compression fracture     t12  . IBS (irritable bowel syndrome)   . Vaginal prolapse   . TMJ (temporomandibular joint disorder)   . Anemia   . Hearing loss   . Blood in stool   . Anxiety    . Cancer     Colon  . Unspecified essential hypertension 11/18/2012  . Coronary atherosclerosis of native coronary artery 10/25/2012  . Long term (current) use of anticoagulants 10/25/2012  . Other pulmonary embolism and infarction 10/15/2012  . Phlebitis and thrombophlebitis of other deep vessels of lower extremities 10/15/2012  . Malignant neoplasm of colon, unspecified site 09/27/2012  . 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 Vitamin D deficiency 09/27/2012  . Hypopotassemia 09/27/2012  . Major depressive disorder, single episode, unspecified 09/27/2012  . Blepharitis, unspecified 09/27/2012  . Diffuse cystic mastopathy 09/27/2012  . Degeneration of intervertebral disc, site unspecified 09/27/2012  . Muscle weakness (generalized) 09/27/2012  . Abnormal involuntary movements(781.0) 09/27/2012  . Edema 09/27/2012  . Unspecified urinary incontinence 09/27/2012  . Colostomy status 09/27/2012    Past Surgical History  Procedure Laterality Date  . Appendectomy    . Vaginal prolapse repair  2005 - approximate  . Cystectomy      back  . Nasal reconstruction with septal repair  1983  . Skin cancer excision    . Arthroscopy left knee  2007  . Colonoscopy    . Total abdominal hysterectomy  1978  . Hernia repair  1980 - approximate  . Eye surgery  [phone]    cataracts bilateral  . Back surgery      mid back  . Laparoscopic sigmoid colectomy  09/20/2012    Procedure: LAPAROSCOPIC SIGMOID COLECTOMY;  Surgeon: Emelia Loron, MD;  Location: [facility];  Service: General;  Laterality: N/A;  Laparoscopic possible Open Sigmoid Colectomy,Colostomy  . Colostomy  09/20/2012    Procedure: COLOSTOMY;  Surgeon: Emelia Loron, MD;  Location: [facility];  Service: General;  Laterality: N/A;      Medication List       This list is accurate as of: 04/18/13 10:39 AM.  Always use your most recent med list.               acetaminophen 500 MG tablet  Commonly known as:   TYLENOL  Take 1,000 mg by mouth every 4 (four) hours as needed for pain.     albuterol (2.5 MG/3ML) 0.083% nebulizer solution  Commonly known as:  PROVENTIL  Take 2.5 mg by nebulization. Use one via nebulizer every 6 hours a needed for SOB or wheezing     antiseptic oral rinse Liqd  15 mLs by Mouth Rinse route as needed.     capecitabine 500 MG tablet  Commonly known as:  XELODA  1500 mg every am and 1000 mg every pm X 14 days on, 7 days rest     docusate sodium 100 MG capsule  Commonly known as:  COLACE  Take 400 mg by mouth at bedtime as needed. For constipation     ENULOSE 10 GM/15ML Soln  Generic drug:  lactulose (encephalopathy)  40 g daily.     erythromycin ophthalmic ointment  Place 1 application into both eyes at bedtime.     feeding supplement Liqd  Take 237 mLs by mouth 2 (two) times daily between meals.     ferrous sulfate 325 (65 FE) MG tablet  Take 325 mg by mouth daily with breakfast.     metoprolol tartrate 25 MG tablet  Commonly known as:  LOPRESSOR  Take 1 tablet (25 mg total) by mouth 2 (two) times daily.     ondansetron 4 MG tablet  Commonly known as:  ZOFRAN  Take 1 tablet (4 mg total) by mouth every 6 (six) hours.     potassium chloride 10 MEQ CR capsule  Commonly known as:  MICRO-K  Take two  tablest daily for potassium     sertraline 50 MG tablet  Commonly known as:  ZOLOFT  Take 100 mg by mouth every morning.     XARELTO 20 MG Tabs tablet  Generic drug:  Rivaroxaban  Take 20 mg by mouth daily.         Immunization History  Administered Date(s) Administered  . Influenza Whole 06/07/2012  . Pneumococcal Polysaccharide 09/08/2003  . Td 09/08/2003    History  Substance Use Topics  . Smoking status: Never Smoker   . Smokeless tobacco: Never Used  . Alcohol Use: No    Family history is noncontributory    Review of Systems Review of Systems  Constitutional: Positive for fatigue. Negative for fever, chills, diaphoresis, activity  change and appetite change.  HENT: Positive for hearing loss. Negative for ear pain, congestion, rhinorrhea, trouble swallowing, neck pain, neck stiffness, voice change, postnasal drip and sinus pressure.   Eyes: Negative for pain, discharge, itching and visual disturbance.  Respiratory: Positive for shortness of breath (on exertion ). Negative for cough, choking, chest tightness and wheezing.   Cardiovascular: Positive for leg swelling (trace). Negative for chest pain and palpitations.  Gastrointestinal: Negative for nausea, vomiting, abdominal pain, diarrhea, constipation and abdominal distention.       History of colon cancer. Colostomy present. Under chemotherapy with Xeloda.  Endocrine: Negative.   Genitourinary: Negative for dysuria, urgency,  frequency, flank pain and pelvic pain.  Musculoskeletal: Positive for back pain, arthralgias and gait problem. Negative for myalgias and joint swelling.       History of falls.  Skin: Negative for rash.  Allergic/Immunologic: Negative.   Neurological: Negative for tremors, seizures, speech difficulty, weakness, light-headedness, numbness and headaches.       Poor balance.  Hematological: Negative.   Psychiatric/Behavioral: Positive for confusion (occasinally). Negative for hallucinations, behavioral problems, sleep disturbance, dysphoric mood, decreased concentration and agitation. The patient is not nervous/anxious.     Physical Exam Physical Exam  Constitutional: She is oriented to person, place, and time. She appears well-developed and well-nourished.  HENT:  Head: Normocephalic and atraumatic.  Bilateral hearing aids. Does not use often.  Eyes: Conjunctivae and EOM are normal. Pupils are equal, round, and reactive to light.  Corrective lenses.  Neck: Normal range of motion. Neck supple. No JVD present. No tracheal deviation present.  Cardiovascular: Normal rate and regular rhythm.   No murmur heard. Respiratory: Effort normal and breath  sounds normal. She has no wheezes. She has no rales.  GI: Soft. Bowel sounds are normal. There is no tenderness.  Large ventral hernia at the site of the colostomy. Nontender.  Musculoskeletal: Normal range of motion. She exhibits no edema and no tenderness.  Lymphadenopathy:    She has no cervical adenopathy.  Neurological: She is alert and oriented to person, place, and time. She has normal reflexes. No cranial nerve deficit. She exhibits normal muscle tone. Coordination normal.  Mild to moderate memory loss and confusion. Positive romberg. Loss of vibratory sensation in the left foot.  Skin: Skin is warm and dry. No rash noted.  Psychiatric: She has a normal mood and affect. Her speech is normal and behavior is normal. Judgment and thought content normal. She is not agitated, not aggressive, not slowed, not withdrawn and not actively hallucinating. Thought content is not paranoid and not delusional. She does not express impulsivity. She exhibits abnormal recent memory.    BP 136/64  Pulse 62  Ht 5\' 7"  (1.702 m)  Wt 142 lb (64.411 kg)  BMI 22.24 kg/m2  Physical Exam  Constitutional: She is oriented to person, place, and time. She appears well-developed and well-nourished.  HENT:  Head: Normocephalic and atraumatic.  Bilateral hearing aids. Does not use often.  Eyes: Conjunctivae and EOM are normal. Pupils are equal, round, and reactive to light.  Corrective lenses.  Neck: Normal range of motion. Neck supple. No JVD present. No tracheal deviation present.  Cardiovascular: Normal rate and regular rhythm.   No murmur heard. Pulmonary/Chest: Effort normal and breath sounds normal. She has no wheezes. She has no rales.  Abdominal: Soft. Bowel sounds are normal. There is no tenderness.  Large ventral hernia at the site of the colostomy. Nontender.  Musculoskeletal: Normal range of motion. She exhibits no edema and no tenderness.  Lymphadenopathy:    She has no cervical adenopathy.   Neurological: She is alert and oriented to person, place, and time. She has normal reflexes. No cranial nerve deficit. She exhibits normal muscle tone. Coordination normal.  Mild to moderate memory loss and confusion. Positive romberg. Loss of vibratory sensation in the left foot.  Skin: Skin is warm and dry. No rash noted.  Psychiatric: She has a normal mood and affect. Her speech is normal and behavior is normal. Judgment and thought content normal. She is not agitated, not aggressive, not slowed, not withdrawn and not actively hallucinating. Thought content is not  paranoid and not delusional. She does not express impulsivity. She exhibits abnormal recent memory.      Functional assessment: asst in med supervision. i advised her not to drive.  CBC    Component Value Date/Time   WBC 7.1 04/10/2013 0929   WBC 7.4 11/07/2012   WBC 9.8 10/14/2012 0440   RBC 2.99* 04/10/2013 0929   RBC 3.10* 10/14/2012 0440   RBC 2.88* 10/09/2012 1140   HGB 10.6* 04/10/2013 0929   HGB 10.4* 11/07/2012   HCT 30.7* 04/10/2013 0929   HCT 32* 11/07/2012   PLT 224 04/10/2013 0929   PLT 330 11/07/2012   MCV 102.7* 04/10/2013 0929   MCV 87.1 10/14/2012 0440   LYMPHSABS 3.2 04/10/2013 0929   LYMPHSABS 1.4 10/08/2012 1600   MONOABS 0.6 04/10/2013 0929   MONOABS 1.4* 10/08/2012 1600   EOSABS 0.1 04/10/2013 0929   EOSABS 0.0 10/08/2012 1600   BASOSABS 0.1 04/10/2013 0929   BASOSABS 0.0 10/08/2012 1600    CMP     Component Value Date/Time   NA 144 04/10/2013 0929   NA 140 11/07/2012   NA 140 10/14/2012 0440   K 3.5 04/10/2013 0929   K 3.9 11/07/2012   CL 109* 02/03/2013 1146   CL 108 10/14/2012 0440   CO2 23 04/10/2013 0929   CO2 23 10/14/2012 0440   GLUCOSE 116 04/10/2013 0929   GLUCOSE 103* 02/03/2013 1146   GLUCOSE 116* 10/14/2012 0440   BUN 17.0 04/10/2013 0929   BUN 21 11/07/2012   BUN 11 10/14/2012 0440   CREATININE 0.9 04/10/2013 0929   CREATININE 0.8 11/07/2012   CREATININE 0.58 10/14/2012 0440   CALCIUM 9.5 04/10/2013 0929   CALCIUM 8.2* 10/14/2012 0440    PROT 6.6 04/10/2013 0929   PROT 5.9* 10/08/2012 1600   ALBUMIN 3.3* 04/10/2013 0929   ALBUMIN 2.6* 10/08/2012 1600   AST 12 04/10/2013 0929   AST 36 10/08/2012 1600   ALT 11 04/10/2013 0929   ALT 35 10/08/2012 1600   ALKPHOS 68 04/10/2013 0929   ALKPHOS 121* 10/08/2012 1600   BILITOT 0.75 04/10/2013 0929   BILITOT 0.4 10/08/2012 1600   GFRNONAA 82* 10/14/2012 0440   GFRAA >90 10/14/2012 0440    Assessment and Plan Colon cancer: entering last chemo cycle  Colostomy status; continues to leak  Long term (current) use of anticoagulants: prior Pumonary embolus  Bilateral pulmonary embolism: continues anticoagulation  Unspecified essential hypertension: controlled  Anemia: persists. Remains on iron  Memory loss; mil, but enough in my opinion to tell her that she should not drive. Will continue to follow MMSE.      Quanesha Klimaszewski, Lenon Curt, MD

## 2013-05-01 ENCOUNTER — Telehealth: Payer: Self-pay | Admitting: *Deleted

## 2013-05-01 LAB — CBC AND DIFFERENTIAL
HCT: 28 % — AB (ref 36–46)
Hemoglobin: 9.3 g/dL — AB (ref 12.0–16.0)
WBC: 6.5 10^3/mL

## 2013-05-01 LAB — HEPATIC FUNCTION PANEL
AST: 10 U/L — AB (ref 13–35)
Alkaline Phosphatase: 62 U/L (ref 25–125)
Bilirubin, Total: 0.6 mg/dL

## 2013-05-01 LAB — BASIC METABOLIC PANEL
Glucose: 88 mg/dL
Potassium: 4 mmol/L (ref 3.4–5.3)
Sodium: 140 mmol/L (ref 137–147)

## 2013-05-01 NOTE — Telephone Encounter (Signed)
Form for medicare documentation for Xeloda forwarded to managed care department.

## 2013-06-05 ENCOUNTER — Other Ambulatory Visit (HOSPITAL_BASED_OUTPATIENT_CLINIC_OR_DEPARTMENT_OTHER): Payer: Medicare Other

## 2013-06-05 DIAGNOSIS — C189 Malignant neoplasm of colon, unspecified: Secondary | ICD-10-CM

## 2013-06-05 LAB — CBC WITH DIFFERENTIAL/PLATELET
BASO%: 0.1 % (ref 0.0–2.0)
EOS%: 1.4 % (ref 0.0–7.0)
Eosinophils Absolute: 0.1 10*3/uL (ref 0.0–0.5)
LYMPH%: 41 % (ref 14.0–49.7)
MCH: 33.5 pg (ref 25.1–34.0)
MCHC: 33.7 g/dL (ref 31.5–36.0)
MCV: 99.6 fL (ref 79.5–101.0)
MONO%: 10 % (ref 0.0–14.0)
NEUT#: 3.6 10*3/uL (ref 1.5–6.5)
Platelets: 254 10*3/uL (ref 145–400)
RBC: 3.34 10*6/uL — ABNORMAL LOW (ref 3.70–5.45)
RDW: 15.1 % — ABNORMAL HIGH (ref 11.2–14.5)
lymph#: 3.1 10*3/uL (ref 0.9–3.3)

## 2013-06-16 ENCOUNTER — Non-Acute Institutional Stay: Payer: Medicare Other | Admitting: Nurse Practitioner

## 2013-06-16 ENCOUNTER — Encounter: Payer: Self-pay | Admitting: Nurse Practitioner

## 2013-06-16 DIAGNOSIS — R05 Cough: Secondary | ICD-10-CM

## 2013-06-16 DIAGNOSIS — F329 Major depressive disorder, single episode, unspecified: Secondary | ICD-10-CM

## 2013-06-16 DIAGNOSIS — R059 Cough, unspecified: Secondary | ICD-10-CM

## 2013-06-16 DIAGNOSIS — I10 Essential (primary) hypertension: Secondary | ICD-10-CM

## 2013-06-16 DIAGNOSIS — K219 Gastro-esophageal reflux disease without esophagitis: Secondary | ICD-10-CM

## 2013-06-16 DIAGNOSIS — I2699 Other pulmonary embolism without acute cor pulmonale: Secondary | ICD-10-CM

## 2013-06-16 DIAGNOSIS — Z933 Colostomy status: Secondary | ICD-10-CM

## 2013-06-16 DIAGNOSIS — D649 Anemia, unspecified: Secondary | ICD-10-CM

## 2013-06-16 DIAGNOSIS — R609 Edema, unspecified: Secondary | ICD-10-CM

## 2013-06-16 NOTE — Assessment & Plan Note (Signed)
Functional 

## 2013-06-16 NOTE — Assessment & Plan Note (Signed)
continue to improve,  takes Fe 325mg, Hgb 9.2 10/03/12--Hgb 10.4 11/07/12-Hgb 9.3 05/01/13     

## 2013-06-16 NOTE — Progress Notes (Signed)
Patient ID: Erin Hansen, female   DOB: 20-Jun-1926, 79 y.o.   MRN: [id number]  Code Status: Full Code  Allergies  Allergen Reactions  . Codeine Nausea Only    Chief Complaint  Patient presents with  . Medical Managment of Chronic Issues    insomnia    HPI: Patient is a 79 y.o. female seen in the AL at [facility] today for evaluation of insomnia, depression, cough, and her other chronic medical conditions.  Problem List Items Addressed This Visit   Anemia     continue to improve,  takes Fe 325mg , Hgb 9.2 10/03/12--Hgb 10.4 11/07/12-Hgb 9.3 05/01/13      Bilateral pulmonary embolism     Presently takes Xarelto 20mg  daily    Colostomy status     Functional.     Cough     Described as hacking cough-worse at night-lungs clear today. Will have Benzonatate 100mg  bid for 2 weeks then prn. Differential dx: GERD--will start Omeprazole 20mg  daily empirically for now. Observe the symptoms.     Edema     BLE--not apparent.     GERD (gastroesophageal reflux disease)   Major depressive disorder, single episode, unspecified (Chronic)     C/o difficulty of falling asleep--usually after midnight--then only sleeps 3-4 hours-then falling asleep again after daylight. Will increase Sertraline to 125mg  daily, make Lorazepam 0.5mg  q6hr prn available to her    Relevant Medications      LORazepam (ATIVAN) 0.5 MG tablet   Unspecified essential hypertension - Primary      takes Metoprolol 25mg  bid.           Review of Systems:  Review of Systems  Constitutional: Negative for fever, chills, weight loss, malaise/fatigue and diaphoresis.  HENT: Positive for hearing loss. Negative for congestion, ear discharge and sore throat.   Eyes: Negative for blurred vision, pain, discharge and redness.  Respiratory: Positive for cough. Negative for sputum production, shortness of breath and wheezing.   Cardiovascular: Negative for chest pain, palpitations, orthopnea, claudication, leg swelling and PND.   Gastrointestinal: Negative for heartburn, nausea, vomiting, abdominal pain, diarrhea, constipation and blood in stool.       Colostomy  Genitourinary: Positive for frequency (incontinent of bladder). Negative for dysuria, urgency and hematuria.  Musculoskeletal: Positive for back pain. Negative for falls, joint pain, myalgias and neck pain.  Skin: Negative for itching and rash.  Neurological: Negative for dizziness, tingling, tremors, sensory change, speech change, focal weakness, seizures, loss of consciousness, weakness and headaches.  Endo/Heme/Allergies: Negative for environmental allergies and polydipsia. Does not bruise/bleed easily.  Psychiatric/Behavioral: Positive for depression and memory loss. Negative for hallucinations. The patient is nervous/anxious and has insomnia.      Past Medical History  Diagnosis Date  . DJD (degenerative joint disease)   . Compression fracture     t12  . IBS (irritable bowel syndrome)   . Vaginal prolapse   . TMJ (temporomandibular joint disorder)   . Anemia   . Hearing loss   . Blood in stool   . Anxiety   . Cancer     Colon  . Unspecified essential hypertension 11/18/2012  . Coronary atherosclerosis of native coronary artery 10/25/2012  . Long term (current) use of anticoagulants 10/25/2012  . Other pulmonary embolism and infarction 10/15/2012  . Phlebitis and thrombophlebitis of other deep vessels of lower extremities 10/15/2012  . Malignant neoplasm of colon, unspecified site 09/27/2012  . 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  Vitamin D deficiency 09/27/2012  . Hypopotassemia 09/27/2012  . Major depressive disorder, single episode, unspecified 09/27/2012  . Blepharitis, unspecified 09/27/2012  . Diffuse cystic mastopathy 09/27/2012  . Degeneration of intervertebral disc, site unspecified 09/27/2012  . Muscle weakness (generalized) 09/27/2012  . Abnormal involuntary movements(781.0) 09/27/2012  . Edema 09/27/2012   . Unspecified urinary incontinence 09/27/2012  . Colostomy status 09/27/2012   Past Surgical History  Procedure Laterality Date  . Appendectomy    . Vaginal prolapse repair  2005 - approximate  . Cystectomy      back  . Nasal reconstruction with septal repair  1983  . Skin cancer excision    . Arthroscopy left knee  2007  . Colonoscopy    . Total abdominal hysterectomy  1978  . Hernia repair  1980 - approximate  . Eye surgery  [phone]    cataracts bilateral  . Back surgery      mid back  . Laparoscopic sigmoid colectomy  09/20/2012    Procedure: LAPAROSCOPIC SIGMOID COLECTOMY;  Surgeon: Emelia Loron, MD;  Location: [facility];  Service: General;  Laterality: N/A;  Laparoscopic possible Open Sigmoid Colectomy,Colostomy  . Colostomy  09/20/2012    Procedure: COLOSTOMY;  Surgeon: Emelia Loron, MD;  Location: [facility];  Service: General;  Laterality: N/A;   Social History:   reports that she has never smoked. She has never used smokeless tobacco. She reports that she does not drink alcohol or use illicit drugs.  Family History  Problem Relation Age of Onset  . Cancer Son     spine  . Cancer Son     Medications: Patient's Medications  New Prescriptions   No medications on file  Previous Medications   ACETAMINOPHEN (TYLENOL) 500 MG TABLET    Take 1,000 mg by mouth every 4 (four) hours as needed for pain.   ALBUTEROL (PROVENTIL) (2.5 MG/3ML) 0.083% NEBULIZER SOLUTION    Take 2.5 mg by nebulization. Use one via nebulizer every 6 hours a needed for SOB or wheezing   ANTISEPTIC ORAL RINSE (BIOTENE) LIQD    15 mLs by Mouth Rinse route as needed.   CAPECITABINE (XELODA) 500 MG TABLET    1500 mg every am and 1000 mg every pm X 14 days on, 7 days rest   DOCUSATE SODIUM (COLACE) 100 MG CAPSULE    Take 400 mg by mouth at bedtime as needed. For constipation   ENULOSE 10 GM/15ML SOLN    40 g daily.    ERYTHROMYCIN OPHTHALMIC OINTMENT    Place 1 application into both eyes at bedtime.    FEEDING SUPPLEMENT (ENSURE COMPLETE) LIQD    Take 237 mLs by mouth 2 (two) times daily between meals.   FERROUS SULFATE 325 (65 FE) MG TABLET    Take 325 mg by mouth daily with breakfast.    LORAZEPAM (ATIVAN) 0.5 MG TABLET    Take 0.5 mg by mouth every 6 (six) hours as needed for anxiety.   METOPROLOL TARTRATE (LOPRESSOR) 25 MG TABLET    Take 1 tablet (25 mg total) by mouth 2 (two) times daily.   ONDANSETRON (ZOFRAN) 4 MG TABLET    Take 1 tablet (4 mg total) by mouth every 6 (six) hours.   POTASSIUM CHLORIDE (MICRO-K) 10 MEQ CR CAPSULE    Take two  tablest daily for potassium   RIVAROXABAN (XARELTO) 20 MG TABS TABLET    Take 20 mg by mouth daily.   SERTRALINE (ZOLOFT) 50 MG TABLET    Take 125  mg by mouth every morning.   Modified Medications   No medications on file  Discontinued Medications   No medications on file     Physical Exam: Physical Exam  Constitutional: She is oriented to person, place, and time. She appears well-developed and well-nourished.  HENT:  Head: Normocephalic and atraumatic.  Eyes: Conjunctivae and EOM are normal. Pupils are equal, round, and reactive to light.  Neck: Normal range of motion. Neck supple. No JVD present. No tracheal deviation present.  Cardiovascular: Normal rate and regular rhythm.   No murmur heard. Pulmonary/Chest: Effort normal and breath sounds normal. She has no wheezes. She has no rales.  Abdominal: Soft. Bowel sounds are normal. There is no tenderness.  Musculoskeletal: Normal range of motion. She exhibits no edema and no tenderness.  Lymphadenopathy:    She has no cervical adenopathy.  Neurological: She is alert and oriented to person, place, and time. She has normal reflexes. No cranial nerve deficit. She exhibits normal muscle tone. Coordination normal.  Skin: Skin is warm and dry. No rash noted.  Psychiatric: Her speech is normal. She is not agitated, not aggressive, not slowed, not withdrawn and not actively hallucinating. Thought  content is not paranoid and not delusional. She expresses inappropriate judgment. She does not express impulsivity. She exhibits a depressed mood. She exhibits abnormal recent memory.    Filed Vitals:   06/16/13 1029  BP: 126/60  Pulse: 66  Temp: 98.6 F (37 C)  TempSrc: Tympanic  Resp: 20      Labs reviewed: Basic Metabolic Panel:  Recent Labs  16/10/96 0442 10/12/12 0435  12/05/12 1000 12/23/12 1138 02/03/13 1146 03/24/13 1304 04/10/13 0929 05/01/13  NA 136 140  < > 144 141 142 142 144 140  K 3.8 2.9*  < > 3.9 4.4 4.1 4.4 3.5 4.0  CL 107 106  < > 108* 107 109*  --   --   --   CO2 20 24  < > 24 26 25 25 23   --   GLUCOSE 121* 121*  < > 110* 109* 103* 120 116  --   BUN 20 13  < > 18.4 17.9 19.7 23.2 17.0 19  CREATININE 0.76 0.75  < > 0.8 0.9 0.9 0.9 0.9 0.8  CALCIUM 8.3* 8.2*  < > 9.2 9.5 9.3 9.1 9.5  --   MG  --  1.3*  --   --   --   --   --   --   --   < > = values in this interval not displayed. Liver Function Tests:  Recent Labs  02/03/13 1146 03/24/13 1304 04/10/13 0929 05/01/13  AST 12 12 12  10*  ALT 11 12 11 9   ALKPHOS 80 68 68 62  BILITOT 0.57 0.52 0.75  --   PROT 6.6 6.3* 6.6  --   ALBUMIN 3.2* 3.2* 3.3*  --     Recent Labs  10/07/12 1655 10/08/12 1600 10/08/12 2040  LIPASE 17 16  --   AMYLASE  --   --  55   No results found for this basename: AMMONIA,  in the last 8760 hours CBC:  Recent Labs  03/24/13 1304 04/10/13 0929 05/01/13 06/05/13 1019  WBC 5.5 7.1 6.5 7.5  NEUTROABS 2.8 3.1  --  3.6  HGB 10.2* 10.6* 9.3* 11.2*  HCT 30.4* 30.7* 28* 33.2*  MCV 103.0* 102.7*  --  99.6  PLT 228 224 226 254   Past Procedures:  10/08/12 CXR mild pulmonary  vascular congestion with pneumonitis in the left lower lung.    Assessment/Plan Unspecified essential hypertension  takes Metoprolol 25mg  bid.      Edema BLE--not apparent.   Anemia continue to improve,  takes Fe 325mg , Hgb 9.2 10/03/12--Hgb 10.4 11/07/12-Hgb 9.3  05/01/13    Bilateral pulmonary embolism Presently takes Xarelto 20mg  daily  Colostomy status Functional.   Major depressive disorder, single episode, unspecified C/o difficulty of falling asleep--usually after midnight--then only sleeps 3-4 hours-then falling asleep again after daylight. Will increase Sertraline to 125mg  daily, make Lorazepam 0.5mg  q6hr prn available to her  Cough Described as hacking cough-worse at night-lungs clear today. Will have Benzonatate 100mg  bid for 2 weeks then prn. Differential dx: GERD--will start Omeprazole 20mg  daily empirically for now. Observe the symptoms.     Family/ Staff Communication:   Goals of Care: AL  Labs/tests ordered: none

## 2013-06-16 NOTE — Assessment & Plan Note (Signed)
BLE-not apparent.  

## 2013-06-16 NOTE — Assessment & Plan Note (Signed)
takes Metoprolol 25mg bid.         

## 2013-06-16 NOTE — Assessment & Plan Note (Signed)
Described as hacking cough-worse at night-lungs clear today. Will have Benzonatate 100mg  bid for 2 weeks then prn. Differential dx: GERD--will start Omeprazole 20mg  daily empirically for now. Observe the symptoms.

## 2013-06-16 NOTE — Assessment & Plan Note (Signed)
Presently takes Xarelto 20mg  daily

## 2013-06-16 NOTE — Assessment & Plan Note (Signed)
C/o difficulty of falling asleep--usually after midnight--then only sleeps 3-4 hours-then falling asleep again after daylight. Will increase Sertraline to 125mg  daily, make Lorazepam 0.5mg  q6hr prn available to her

## 2013-07-14 ENCOUNTER — Telehealth (INDEPENDENT_AMBULATORY_CARE_PROVIDER_SITE_OTHER): Payer: Self-pay

## 2013-07-14 NOTE — Telephone Encounter (Signed)
Darral Dash called to let me know that she found out that Dr Dwain Sarna was marked as the primary care doctor on the pharmacy orders. This is the reason Dr Dwain Sarna kept getting orders sent over on the pt so they have changed it to the correct physician.

## 2013-07-14 NOTE — Telephone Encounter (Signed)
LM for medical records with assisted living to call me back to see why Dr Dwain Sarna keeps getting [facility] [address] Physician's Orders.

## 2013-07-28 ENCOUNTER — Encounter: Payer: Self-pay | Admitting: Nurse Practitioner

## 2013-07-28 ENCOUNTER — Non-Acute Institutional Stay: Payer: Medicare Other | Admitting: Nurse Practitioner

## 2013-07-28 DIAGNOSIS — F329 Major depressive disorder, single episode, unspecified: Secondary | ICD-10-CM

## 2013-07-28 DIAGNOSIS — I10 Essential (primary) hypertension: Secondary | ICD-10-CM

## 2013-07-28 DIAGNOSIS — R413 Other amnesia: Secondary | ICD-10-CM

## 2013-07-28 DIAGNOSIS — D649 Anemia, unspecified: Secondary | ICD-10-CM

## 2013-07-28 DIAGNOSIS — N390 Urinary tract infection, site not specified: Secondary | ICD-10-CM

## 2013-07-28 DIAGNOSIS — K219 Gastro-esophageal reflux disease without esophagitis: Secondary | ICD-10-CM

## 2013-07-28 NOTE — Assessment & Plan Note (Signed)
C/o difficulty of falling asleep--usually after midnight--then only sleeps 3-4 hours-then falling asleep again after daylight-positive response since Sertraline 125mg  daily since 06/16/13 along with Lorazepam 0.5mg  q6hr prn available to her

## 2013-07-28 NOTE — Assessment & Plan Note (Signed)
Adequately functioning in AL

## 2013-07-28 NOTE — Assessment & Plan Note (Signed)
Chills, queasy stomach, urine culture P. Agglomerans >100,000c/ml, susceptible to Trimeth/Sulfa--Septra DS I bid for 7 days along with FloraStor.

## 2013-07-28 NOTE — Assessment & Plan Note (Signed)
Stable

## 2013-07-28 NOTE — Assessment & Plan Note (Signed)
takes Metoprolol 25mg bid.         

## 2013-07-28 NOTE — Progress Notes (Signed)
Patient ID: Erin Hansen, female   DOB: 01/10/1926, 79 y.o.   MRN: [id number]  Code Status: Full Code  Allergies  Allergen Reactions  . Codeine Nausea Only    Chief Complaint  Patient presents with  . Medical Managment of Chronic Issues    UTI  . Acute Visit    HPI: Patient is a 79 y.o. female seen in the AL at [facility] today for evaluation of UTI(chills and queasy stomach) and her other chronic medical conditions.  Problem List Items Addressed This Visit   Anemia     continue to improve,  takes Fe 325mg , Hgb 9.2 10/03/12--Hgb 10.4 11/07/12-Hgb 9.3 05/01/13        GERD (gastroesophageal reflux disease)     Stable.     Major depressive disorder, single episode, unspecified (Chronic)     C/o difficulty of falling asleep--usually after midnight--then only sleeps 3-4 hours-then falling asleep again after daylight-positive response since Sertraline 125mg  daily since 06/16/13 along with Lorazepam 0.5mg  q6hr prn available to her      Memory loss     Adequately functioning in AL    Unspecified essential hypertension      takes Metoprolol 25mg  bid.          Urinary tract infection, site not specified - Primary     Chills, queasy stomach, urine culture P. Agglomerans >100,000c/ml, susceptible to Trimeth/Sulfa--Septra DS I bid for 7 days along with FloraStor.        Review of Systems:  Review of Systems  Constitutional: Positive for chills and malaise/fatigue. Negative for fever, weight loss and diaphoresis.  HENT: Positive for hearing loss. Negative for congestion, ear discharge and sore throat.   Eyes: Negative for blurred vision, pain, discharge and redness.  Respiratory: Positive for cough. Negative for sputum production, shortness of breath and wheezing.   Cardiovascular: Negative for chest pain, palpitations, orthopnea, claudication, leg swelling and PND.  Gastrointestinal: Positive for nausea. Negative for heartburn, vomiting, abdominal pain, diarrhea,  constipation and blood in stool.       Colostomy  Genitourinary: Positive for frequency (incontinent of bladder). Negative for dysuria, urgency and hematuria.  Musculoskeletal: Positive for back pain. Negative for falls, joint pain, myalgias and neck pain.  Skin: Negative for itching and rash.  Neurological: Negative for dizziness, tingling, tremors, sensory change, speech change, focal weakness, seizures, loss of consciousness, weakness and headaches.  Endo/Heme/Allergies: Negative for environmental allergies and polydipsia. Does not bruise/bleed easily.  Psychiatric/Behavioral: Positive for depression and memory loss. Negative for hallucinations. The patient is nervous/anxious and has insomnia.      Past Medical History  Diagnosis Date  . DJD (degenerative joint disease)   . Compression fracture     t12  . IBS (irritable bowel syndrome)   . Vaginal prolapse   . TMJ (temporomandibular joint disorder)   . Anemia   . Hearing loss   . Blood in stool   . Anxiety   . Cancer     Colon  . Unspecified essential hypertension 11/18/2012  . Coronary atherosclerosis of native coronary artery 10/25/2012  . Long term (current) use of anticoagulants 10/25/2012  . Other pulmonary embolism and infarction 10/15/2012  . Phlebitis and thrombophlebitis of other deep vessels of lower extremities 10/15/2012  . Malignant neoplasm of colon, unspecified site 09/27/2012  . 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 Vitamin D deficiency 09/27/2012  . Hypopotassemia 09/27/2012  . Major depressive disorder, single episode, unspecified 09/27/2012  .  Blepharitis, unspecified 09/27/2012  . Diffuse cystic mastopathy 09/27/2012  . Degeneration of intervertebral disc, site unspecified 09/27/2012  . Muscle weakness (generalized) 09/27/2012  . Abnormal involuntary movements(781.0) 09/27/2012  . Edema 09/27/2012  . Unspecified urinary incontinence 09/27/2012  . Colostomy status 09/27/2012   Past  Surgical History  Procedure Laterality Date  . Appendectomy    . Vaginal prolapse repair  2005 - approximate  . Cystectomy      back  . Nasal reconstruction with septal repair  1983  . Skin cancer excision    . Arthroscopy left knee  2007  . Colonoscopy    . Total abdominal hysterectomy  1978  . Hernia repair  1980 - approximate  . Eye surgery  [phone]    cataracts bilateral  . Back surgery      mid back  . Laparoscopic sigmoid colectomy  09/20/2012    Procedure: LAPAROSCOPIC SIGMOID COLECTOMY;  Surgeon: Emelia Loron, MD;  Location: [facility];  Service: General;  Laterality: N/A;  Laparoscopic possible Open Sigmoid Colectomy,Colostomy  . Colostomy  09/20/2012    Procedure: COLOSTOMY;  Surgeon: Emelia Loron, MD;  Location: [facility];  Service: General;  Laterality: N/A;   Social History:   reports that she has never smoked. She has never used smokeless tobacco. She reports that she does not drink alcohol or use illicit drugs.  Family History  Problem Relation Age of Onset  . Cancer Son     spine  . Cancer Son     Medications: Patient's Medications  New Prescriptions   No medications on file  Previous Medications   ACETAMINOPHEN (TYLENOL) 500 MG TABLET    Take 1,000 mg by mouth every 4 (four) hours as needed for pain.   ALBUTEROL (PROVENTIL) (2.5 MG/3ML) 0.083% NEBULIZER SOLUTION    Take 2.5 mg by nebulization. Use one via nebulizer every 6 hours a needed for SOB or wheezing   ANTISEPTIC ORAL RINSE (BIOTENE) LIQD    15 mLs by Mouth Rinse route as needed.   CAPECITABINE (XELODA) 500 MG TABLET    1500 mg every am and 1000 mg every pm X 14 days on, 7 days rest   DOCUSATE SODIUM (COLACE) 100 MG CAPSULE    Take 400 mg by mouth at bedtime as needed. For constipation   ENULOSE 10 GM/15ML SOLN    40 g daily.    ERYTHROMYCIN OPHTHALMIC OINTMENT    Place 1 application into both eyes at bedtime.   FEEDING SUPPLEMENT (ENSURE COMPLETE) LIQD    Take 237 mLs by mouth 2 (two) times daily  between meals.   FERROUS SULFATE 325 (65 FE) MG TABLET    Take 325 mg by mouth daily with breakfast.    LORAZEPAM (ATIVAN) 0.5 MG TABLET    Take 0.5 mg by mouth every 6 (six) hours as needed for anxiety.   METOPROLOL TARTRATE (LOPRESSOR) 25 MG TABLET    Take 1 tablet (25 mg total) by mouth 2 (two) times daily.   ONDANSETRON (ZOFRAN) 4 MG TABLET    Take 1 tablet (4 mg total) by mouth every 6 (six) hours.   POTASSIUM CHLORIDE (MICRO-K) 10 MEQ CR CAPSULE    Take two  tablest daily for potassium   RIVAROXABAN (XARELTO) 20 MG TABS TABLET    Take 20 mg by mouth daily.   SERTRALINE (ZOLOFT) 50 MG TABLET    Take 125 mg by mouth every morning.   Modified Medications   No medications on file  Discontinued Medications  No medications on file     Physical Exam: Physical Exam  Constitutional: She is oriented to person, place, and time. She appears well-developed and well-nourished.  Chills, queasy stomach, staying bed  HENT:  Head: Normocephalic and atraumatic.  Eyes: Conjunctivae and EOM are normal. Pupils are equal, round, and reactive to light.  Neck: Normal range of motion. Neck supple. No JVD present. No tracheal deviation present.  Cardiovascular: Normal rate and regular rhythm.   No murmur heard. Pulmonary/Chest: Effort normal and breath sounds normal. She has no wheezes. She has no rales.  Abdominal: Soft. Bowel sounds are normal. There is no tenderness.  Musculoskeletal: Normal range of motion. She exhibits no edema and no tenderness.  Lymphadenopathy:    She has no cervical adenopathy.  Neurological: She is alert and oriented to person, place, and time. She has normal reflexes. No cranial nerve deficit. She exhibits normal muscle tone. Coordination normal.  Skin: Skin is warm and dry. No rash noted.  Psychiatric: Her speech is normal. She is not agitated, not aggressive, not slowed, not withdrawn and not actively hallucinating. Thought content is not paranoid and not delusional. She  expresses inappropriate judgment. She does not express impulsivity. She exhibits a depressed mood. She exhibits abnormal recent memory.    Filed Vitals:   07/28/13 1301  BP: 118/58  Pulse: 70  Temp: 99 F (37.2 C)  TempSrc: Tympanic  Resp: 20      Labs reviewed: Basic Metabolic Panel:  Recent Labs  62/13/08 0442 10/12/12 0435  12/05/12 1000 12/23/12 1138 02/03/13 1146 03/24/13 1304 04/10/13 0929 05/01/13  NA 136 140  < > 144 141 142 142 144 140  K 3.8 2.9*  < > 3.9 4.4 4.1 4.4 3.5 4.0  CL 107 106  < > 108* 107 109*  --   --   --   CO2 20 24  < > 24 26 25 25 23   --   GLUCOSE 121* 121*  < > 110* 109* 103* 120 116  --   BUN 20 13  < > 18.4 17.9 19.7 23.2 17.0 19  CREATININE 0.76 0.75  < > 0.8 0.9 0.9 0.9 0.9 0.8  CALCIUM 8.3* 8.2*  < > 9.2 9.5 9.3 9.1 9.5  --   MG  --  1.3*  --   --   --   --   --   --   --   < > = values in this interval not displayed. Liver Function Tests:  Recent Labs  02/03/13 1146 03/24/13 1304 04/10/13 0929 05/01/13  AST 12 12 12  10*  ALT 11 12 11 9   ALKPHOS 80 68 68 62  BILITOT 0.57 0.52 0.75  --   PROT 6.6 6.3* 6.6  --   ALBUMIN 3.2* 3.2* 3.3*  --     Recent Labs  10/07/12 1655 10/08/12 1600 10/08/12 2040  LIPASE 17 16  --   AMYLASE  --   --  55   No results found for this basename: AMMONIA,  in the last 8760 hours CBC:  Recent Labs  03/24/13 1304 04/10/13 0929 05/01/13 06/05/13 1019  WBC 5.5 7.1 6.5 7.5  NEUTROABS 2.8 3.1  --  3.6  HGB 10.2* 10.6* 9.3* 11.2*  HCT 30.4* 30.7* 28* 33.2*  MCV 103.0* 102.7*  --  99.6  PLT 228 224 226 254   Past Procedures:  10/08/12 CXR mild pulmonary vascular congestion with pneumonitis in the left lower lung.    Assessment/Plan Urinary  tract infection, site not specified Chills, queasy stomach, urine culture P. Agglomerans >100,000c/ml, susceptible to Trimeth/Sulfa--Septra DS I bid for 7 days along with FloraStor.   Unspecified essential hypertension  takes Metoprolol 25mg  bid.         Memory loss Adequately functioning in AL  Major depressive disorder, single episode, unspecified C/o difficulty of falling asleep--usually after midnight--then only sleeps 3-4 hours-then falling asleep again after daylight-positive response since Sertraline 125mg  daily since 06/16/13 along with Lorazepam 0.5mg  q6hr prn available to her    GERD (gastroesophageal reflux disease) Stable.   Anemia continue to improve,  takes Fe 325mg , Hgb 9.2 10/03/12--Hgb 10.4 11/07/12-Hgb 9.3 05/01/13        Family/ Staff Communication:   Goals of Care: AL  Labs/tests ordered: none

## 2013-07-28 NOTE — Assessment & Plan Note (Signed)
continue to improve,  takes Fe 325mg , Hgb 9.2 10/03/12--Hgb 10.4 11/07/12-Hgb 9.3 05/01/13

## 2013-08-15 ENCOUNTER — Encounter: Payer: Self-pay | Admitting: Internal Medicine

## 2013-08-15 ENCOUNTER — Non-Acute Institutional Stay: Payer: Medicare Other | Admitting: Internal Medicine

## 2013-08-15 VITALS — BP 144/74 | HR 60 | Wt 145.0 lb

## 2013-08-15 DIAGNOSIS — D649 Anemia, unspecified: Secondary | ICD-10-CM

## 2013-08-15 DIAGNOSIS — R609 Edema, unspecified: Secondary | ICD-10-CM

## 2013-08-15 DIAGNOSIS — F329 Major depressive disorder, single episode, unspecified: Secondary | ICD-10-CM

## 2013-08-15 DIAGNOSIS — R413 Other amnesia: Secondary | ICD-10-CM

## 2013-08-15 DIAGNOSIS — N390 Urinary tract infection, site not specified: Secondary | ICD-10-CM

## 2013-08-15 DIAGNOSIS — K9403 Colostomy malfunction: Secondary | ICD-10-CM

## 2013-08-15 DIAGNOSIS — K439 Ventral hernia without obstruction or gangrene: Secondary | ICD-10-CM | POA: Insufficient documentation

## 2013-08-15 DIAGNOSIS — L720 Epidermal cyst: Secondary | ICD-10-CM

## 2013-08-15 DIAGNOSIS — I10 Essential (primary) hypertension: Secondary | ICD-10-CM

## 2013-08-15 DIAGNOSIS — L723 Sebaceous cyst: Secondary | ICD-10-CM

## 2013-08-15 DIAGNOSIS — C189 Malignant neoplasm of colon, unspecified: Secondary | ICD-10-CM

## 2013-08-15 NOTE — Progress Notes (Signed)
Subjective:    Patient ID: Erin Hansen, female    DOB: March 16, 1926, 79 y.o.   MRN: [id number]  Chief Complaint  Patient presents with  . Medical Managment of Chronic Issues    blood pressure, anemia, memory. Per nurse patient is having trouble taking care of colostomy, there is stool all over her room.    HPI Colon cancer: Patient has colon cancer. She has had resection and colostomy. She is also had some chemotherapy. She has not seen her oncologist since August 2014.   Unspecified essential hypertension: Controlled   Urinary tract infection, site not specified: Asymptomatic. Infection earlier this year was treated.   Anemia: Improved. Last hemoglobin 11 g percent   Memory loss: Stable   Major depressive disorder, single episode, unspecified: Denies tearfulness or depressed feelings. She has been treated with sertraline. She has acquired a tremor at rest. This drug is known to cause this problem in other patients   Edema: Chronic and unchanged.   Colostomy dysfunction: Leakage at colostomy site. Patient generally changes her colostomy herself. Staff has helped at times in the past.  Ventral hernia: Pericolostomy herniation. This makes attachments of colostomy bags difficult at times. She then suffers leakage.   Erin Hansen: Patient points out an area on the scalp that is painless. It has been there for months if not years. There is no drainage or ulceration.     Current Outpatient Prescriptions on File Prior to Visit  Medication Sig Dispense Refill  . acetaminophen (TYLENOL) 500 MG tablet Take 1,000 mg by mouth every 4 (four) hours as needed for pain.      Marland Kitchen albuterol (PROVENTIL) (2.5 MG/3ML) 0.083% nebulizer solution Take 2.5 mg by nebulization. Use one via nebulizer every 6 hours a needed for SOB or wheezing      . antiseptic oral rinse (BIOTENE) LIQD 15 mLs by Mouth Rinse route as needed.      . docusate sodium (COLACE) 100 MG capsule Take 400 mg by mouth at bedtime as needed. For  constipation      . ENULOSE 10 GM/15ML SOLN 40 g daily.       Marland Kitchen erythromycin ophthalmic ointment Place 1 application into both eyes at bedtime.      . feeding supplement (ENSURE COMPLETE) LIQD Take 237 mLs by mouth 2 (two) times daily between meals.      . ferrous sulfate 325 (65 FE) MG tablet Take 325 mg by mouth daily with breakfast.       . LORazepam (ATIVAN) 0.5 MG tablet Take 0.5 mg by mouth every 6 (six) hours as needed for anxiety.      . metoprolol tartrate (LOPRESSOR) 25 MG tablet Take 1 tablet (25 mg total) by mouth 2 (two) times daily.      . ondansetron (ZOFRAN) 4 MG tablet Take 1 tablet (4 mg total) by mouth every 6 (six) hours.  20 tablet  0  . potassium chloride (MICRO-K) 10 MEQ CR capsule Take two  tablest daily for potassium      . Rivaroxaban (XARELTO) 20 MG TABS tablet Take 20 mg by mouth daily.      . sertraline (ZOLOFT) 50 MG tablet Take 125 mg by mouth every morning.        No current facility-administered medications on file prior to visit.    Review of Systems  Constitutional: Positive for fatigue. Negative for fever, chills, diaphoresis, activity change and appetite change.  HENT: Positive for hearing loss. Negative for congestion, ear pain,  postnasal drip, rhinorrhea, sinus pressure, trouble swallowing and voice change.   Eyes: Negative for pain, discharge, itching and visual disturbance.  Respiratory: Positive for shortness of breath (on exertion ). Negative for cough, choking, chest tightness and wheezing.   Cardiovascular: Positive for leg swelling (trace). Negative for chest pain and palpitations.  Gastrointestinal: Negative for nausea, vomiting, abdominal pain, diarrhea, constipation and abdominal distention.       History of colon cancer. Colostomy present. Under chemotherapy with Xeloda.  Endocrine: Negative.   Genitourinary: Negative for dysuria, urgency, frequency, flank pain and pelvic pain.  Musculoskeletal: Positive for arthralgias, back pain and gait  problem. Negative for joint swelling, myalgias, neck pain and neck stiffness.       History of falls.  Skin: Negative for rash.       Wen on the scalp at the left parietal occipital junction area.  Allergic/Immunologic: Negative.   Neurological: Negative for tremors, seizures, speech difficulty, weakness, light-headedness, numbness and headaches.       Poor balance.  Hematological: Negative.   Psychiatric/Behavioral: Negative for hallucinations, behavioral problems, confusion, sleep disturbance, dysphoric mood, decreased concentration and agitation. The patient is not nervous/anxious.        Objective:BP 144/74  Pulse 60  Wt 145 lb (65.772 kg)    Physical Exam  Constitutional: She is oriented to person, place, and time. She appears well-developed and well-nourished.  HENT:  Head: Normocephalic and atraumatic.  Bilateral hearing aids. Does not use often.  Eyes: Conjunctivae and EOM are normal. Pupils are equal, round, and reactive to light.  Corrective lenses.  Neck: Normal range of motion. Neck supple. No JVD present. No tracheal deviation present.  Cardiovascular: Normal rate and regular rhythm.   No murmur heard. Pulmonary/Chest: Effort normal and breath sounds normal. She has no wheezes. She has no rales.  Abdominal: Soft. Bowel sounds are normal. There is no tenderness.  Large ventral hernia at the site of the colostomy. Nontender.  Musculoskeletal: Normal range of motion. She exhibits no edema and no tenderness.  Lymphadenopathy:    She has no cervical adenopathy.  Neurological: She is alert and oriented to person, place, and time. She has normal reflexes. No cranial nerve deficit. She exhibits normal muscle tone. Coordination normal.  Mild to moderate memory loss and confusion. Positive Romberg. Loss of vibratory sensation in the left foot. 08/15/13 MMSE 26/30. Failed clock drawing.  Skin: Skin is warm and dry. No rash noted.  Scalp wen in the left occipital parietal  junction area approximately 5 x 7 mm in diameter. Painless. No ulceration.  Psychiatric: She has a normal mood and affect. Her speech is normal and behavior is normal. Judgment and thought content normal. She is not agitated, not aggressive, not slowed, not withdrawn and not actively hallucinating. Thought content is not paranoid and not delusional. She does not express impulsivity. She exhibits abnormal recent memory.      Nursing Home on 06/16/2013  Component Date Value Range Status  . Hemoglobin 05/01/2013 9.3* 12.0 - 16.0 g/dL Final  . HCT 16/06/9603 28* 36 - 46 % Final  . Platelets 05/01/2013 226  150 - 399 K/L Final  . WBC 05/01/2013 6.5   Final  . Glucose 05/01/2013 88   Final  . BUN 05/01/2013 19  4 - 21 mg/dL Final  . Creatinine 54/05/8118 0.8  0.5 - 1.1 mg/dL Final  . Potassium 14/78/2956 4.0  3.4 - 5.3 mmol/L Final  . Sodium 05/01/2013 140  137 - 147 mmol/L Final  .  Alkaline Phosphatase 05/01/2013 62  25 - 125 U/L Final  . ALT 05/01/2013 9  7 - 35 U/L Final  . AST 05/01/2013 10* 13 - 35 U/L Final  . Bilirubin, Total 05/01/2013 0.6   Final  Appointment on 06/05/2013  Component Date Value Range Status  . CEA 06/05/2013 0.6  0.0 - 5.0 ng/mL Final  . WBC 06/05/2013 7.5  3.9 - 10.3 10e3/uL Final  . NEUT# 06/05/2013 3.6  1.5 - 6.5 10e3/uL Final  . HGB 06/05/2013 11.2* 11.6 - 15.9 g/dL Final  . HCT 16/06/9603 33.2* 34.8 - 46.6 % Final  . Platelets 06/05/2013 254  145 - 400 10e3/uL Final  . MCV 06/05/2013 99.6  79.5 - 101.0 fL Final  . MCH 06/05/2013 33.5  25.1 - 34.0 pg Final  . MCHC 06/05/2013 33.7  31.5 - 36.0 g/dL Final  . RBC 54/05/8118 3.34* 3.70 - 5.45 10e6/uL Final  . RDW 06/05/2013 15.1* 11.2 - 14.5 % Final  . lymph# 06/05/2013 3.1  0.9 - 3.3 10e3/uL Final  . MONO# 06/05/2013 0.8  0.1 - 0.9 10e3/uL Final  . Eosinophils Absolute 06/05/2013 0.1  0.0 - 0.5 10e3/uL Final  . Basophils Absolute 06/05/2013 0.0  0.0 - 0.1 10e3/uL Final  . NEUT% 06/05/2013 47.5  38.4 - 76.8  % Final  . LYMPH% 06/05/2013 41.0  14.0 - 49.7 % Final  . MONO% 06/05/2013 10.0  0.0 - 14.0 % Final  . EOS% 06/05/2013 1.4  0.0 - 7.0 % Final  . BASO% 06/05/2013 0.1  0.0 - 2.0 % Final       Assessment & Plan:  Colon cancer: Activity uncertain  Unspecified essential hypertension: Controlled  Urinary tract infection, site not specified: Resolved  Anemia: Improved  Memory loss: Unchanged. MMSE in August 2014 10 the total score of 26/30. She failed her clock drawing test. She wants to resume driving, but I'm reluctant to approve this at this point because of her memory issue and the weakness that she continues to experience in her legs. We will reevaluate driving capability at the time of her next visit.  Major depressive disorder, single episode, unspecified: Discontinued sertraline. The stroke may be related to the appearance of tremor. She does not appear to be depressed at this time.  Edema: Chronic and approximately 1+ bilaterally.  Colostomy dysfunction: Patient will be working with the staff to reduce leakage.  Ventral hernia: Chronic and unchanged. I would not recommend surgery at this time.  Wen: Benign problem. No further action needed.   Multiple drugs were discontinued this visit. Zoloft was stopped because of its association with tremor and some patients. We also discontinued KCl because she is on a diuretic and she has a normal potassium level. Zofran was discontinued because she no longer has nausea. Anemia has improved to the point that we discontinued her ferrous sulfate. She also has questions as to why she takes Biotene. She denies any significant oral discomfort or dry mouth. This drug was also discontinued.

## 2013-08-15 NOTE — Progress Notes (Signed)
Failed clock 

## 2013-08-15 NOTE — Patient Instructions (Signed)
Multiple medications were stopped. We will discuss driving again next visit

## 2013-09-15 ENCOUNTER — Encounter (INDEPENDENT_AMBULATORY_CARE_PROVIDER_SITE_OTHER): Payer: Self-pay

## 2013-09-15 ENCOUNTER — Telehealth: Payer: Self-pay | Admitting: Oncology

## 2013-09-15 ENCOUNTER — Other Ambulatory Visit: Payer: Medicare Other

## 2013-09-15 ENCOUNTER — Ambulatory Visit (HOSPITAL_BASED_OUTPATIENT_CLINIC_OR_DEPARTMENT_OTHER): Payer: Medicare Other | Admitting: Nurse Practitioner

## 2013-09-15 VITALS — BP 136/64 | HR 65 | Temp 96.6°F | Resp 19 | Ht 67.0 in | Wt 146.4 lb

## 2013-09-15 DIAGNOSIS — C187 Malignant neoplasm of sigmoid colon: Secondary | ICD-10-CM

## 2013-09-15 DIAGNOSIS — I82409 Acute embolism and thrombosis of unspecified deep veins of unspecified lower extremity: Secondary | ICD-10-CM

## 2013-09-15 DIAGNOSIS — C189 Malignant neoplasm of colon, unspecified: Secondary | ICD-10-CM

## 2013-09-15 DIAGNOSIS — I2699 Other pulmonary embolism without acute cor pulmonale: Secondary | ICD-10-CM

## 2013-09-15 NOTE — Progress Notes (Signed)
OFFICE PROGRESS NOTE  Interval history:  [name] returns for followup of colon cancer. Colostomy is functioning normally. No abdominal pain. She denies bleeding. Her son accompanies her to today's visit. He reports noticing progressive short-term memory loss. Her long-term memory is intact. She has an occasional headache. No double vision. No focal weakness. No nausea or vomiting.   Objective: Filed Vitals:   09/15/13 1037  BP: 136/64  Pulse: 65  Temp: 96.6 F (35.9 C)  Resp: 19   Oropharynx is without thrush or ulceration. No palpable cervical, supraclavicular, axillary or inguinal lymph nodes. Lungs are clear. Regular cardiac rhythm. Abdomen soft and nontender. No hepatomegaly. Left lower quadrant ostomy. No leg edema. Calves nontender. She is alert, oriented. Follows commands.   Lab Results: Lab Results  Component Value Date   WBC 7.5 06/05/2013   HGB 11.2* 06/05/2013   HCT 33.2* 06/05/2013   MCV 99.6 06/05/2013   PLT 254 06/05/2013   NEUTROABS 3.6 06/05/2013    Chemistry:    Chemistry      Component Value Date/Time   NA 140 05/01/2013   NA 144 04/10/2013 0929   NA 140 10/14/2012 0440   K 4.0 05/01/2013   K 3.5 04/10/2013 0929   CL 109* 02/03/2013 1146   CL 108 10/14/2012 0440   CO2 23 04/10/2013 0929   CO2 23 10/14/2012 0440   BUN 19 05/01/2013   BUN 17.0 04/10/2013 0929   BUN 11 10/14/2012 0440   CREATININE 0.8 05/01/2013   CREATININE 0.9 04/10/2013 0929   CREATININE 0.58 10/14/2012 0440   GLU 88 05/01/2013      Component Value Date/Time   CALCIUM 9.5 04/10/2013 0929   CALCIUM 8.2* 10/14/2012 0440   ALKPHOS 62 05/01/2013   ALKPHOS 68 04/10/2013 0929   AST 10* 05/01/2013   AST 12 04/10/2013 0929   ALT 9 05/01/2013   ALT 11 04/10/2013 0929   BILITOT 0.75 04/10/2013 0929   BILITOT 0.4 10/08/2012 1600       Studies/Results: No results found.  Medications: I have reviewed the patient's current medications.  Assessment/Plan: 1. 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 She began cycle 1 adjuvant Xeloda 11/17/2012. She completed the final cycle of adjuvant Xeloda beginning 04/13/2013. 2. Normal preoperative CEA 09/15/2012 (2.8). CEA normal at 0.6 on 06/05/2013. 3. Hospitalization 10/09/2012 through 10/14/2012 with bilateral pulmonary emboli, right lower extremity DVT. Previously on Coumadin. Anticoagulation changed to Lovenox prior to beginning to Xeloda. Now maintained on Xarelto. We will defer duration of anticoagulation to Dr. Nyoka Hansen. 4. History of diarrhea/rectal bleeding secondary to the sigmoid colon tumor. 5. Status post liver biopsy 09/14/2012 with pathology showing benign liver, negative for malignancy. 6. Liver hematoma on CT 10/08/2012 felt to likely be related to the liver biopsy. 7. Progressive short-term memory deficit. Unlikely this is related to colon cancer. Recommend continued followup with Dr. Nyoka Hansen.   Dispositon-she remains in clinical remission from colon cancer. We will followup on the CEA from today. She will return for a followup visit and CEA in 6 months. She will contact the office in the interim with any problems.  Plan reviewed with Dr. Benay Hansen.   Erin Hansen ANP/GNP-BC

## 2013-09-15 NOTE — Telephone Encounter (Signed)
Gave pt appt for lab and MD for July 2015 °

## 2013-09-16 LAB — CEA: CEA: 0.7 ng/mL (ref 0.0–5.0)

## 2013-09-19 ENCOUNTER — Telehealth: Payer: Self-pay | Admitting: *Deleted

## 2013-09-19 NOTE — Telephone Encounter (Signed)
Message copied by Brien Few on Tue Sep 19, 2013  3:51 PM ------      Message from: Ladell Pier      Created: Sat Sep 16, 2013  9:52 AM       Please call patient, cea is normal ------

## 2013-09-19 NOTE — Telephone Encounter (Signed)
Called pt with CEA results. Normal, per Dr. Sherrill. She voiced understanding. 

## 2013-09-22 ENCOUNTER — Non-Acute Institutional Stay: Payer: Medicare Other | Admitting: Nurse Practitioner

## 2013-09-22 ENCOUNTER — Encounter: Payer: Self-pay | Admitting: Nurse Practitioner

## 2013-09-22 DIAGNOSIS — R413 Other amnesia: Secondary | ICD-10-CM

## 2013-09-22 DIAGNOSIS — I2699 Other pulmonary embolism without acute cor pulmonale: Secondary | ICD-10-CM

## 2013-09-22 DIAGNOSIS — I10 Essential (primary) hypertension: Secondary | ICD-10-CM

## 2013-09-22 DIAGNOSIS — K219 Gastro-esophageal reflux disease without esophagitis: Secondary | ICD-10-CM

## 2013-09-22 DIAGNOSIS — F329 Major depressive disorder, single episode, unspecified: Secondary | ICD-10-CM

## 2013-09-22 DIAGNOSIS — D62 Acute posthemorrhagic anemia: Secondary | ICD-10-CM

## 2013-09-22 NOTE — Assessment & Plan Note (Signed)
Staff reported the patient is more confused, room had clothing, newspapers, magazine allover, removed colostomy bag+wafer, stated she doesn't know how all this happened. Also she thinks her son caused her room to become disarray. LOts of cuing for ADLs. Obtain Labs from cancer center done 09/15/13. Update MMSE and UA C/S. Staff also reported the patient's behavior has worsened since off Zoloft 08/15/13. May consider resume Zoloft if UTI is ruled out.

## 2013-09-22 NOTE — Assessment & Plan Note (Signed)
Continue Xarelto 

## 2013-09-22 NOTE — Assessment & Plan Note (Signed)
Off Iron since 08/15/13-updated CBC 09/15/13 at cancer center.

## 2013-09-22 NOTE — Assessment & Plan Note (Signed)
takes Metoprolol 25mg  bid.

## 2013-09-22 NOTE — Assessment & Plan Note (Signed)
Stable. Takes Omeprazole 20mg daily.  

## 2013-09-22 NOTE — Progress Notes (Signed)
Patient ID: Erin Hansen, female   DOB: 02-15-1926, 80 y.o.   MRN: [id number]   Code Status: DNR  Allergies  Allergen Reactions  . Codeine Nausea Only    Chief Complaint  Patient presents with  . Medical Managment of Chronic Issues    confusion.   . Acute Visit    HPI: Patient is a 80 y.o. female seen in the AL at [facility] today for  evaluation of confusion and other chronic medical conditions Problem List Items Addressed This Visit   Acute posthemorrhagic anemia - Primary     Off Iron since 08/15/13-updated CBC 09/15/13 at cancer center.     Bilateral pulmonary embolism     Continue Xarelto    GERD (gastroesophageal reflux disease)     Stable. Takes Omeprazole 20mg  daily.       Major depressive disorder, single episode, unspecified (Chronic)     Disarray room and she stays in bed most of time. May consider to resume Zoloft.     Memory loss     Staff reported the patient is more confused, room had clothing, newspapers, magazine allover, removed colostomy bag+wafer, stated she doesn't know how all this happened. Also she thinks her son caused her room to become disarray. LOts of cuing for ADLs. Obtain Labs from cancer center done 09/15/13. Update MMSE and UA C/S. Staff also reported the patient's behavior has worsened since off Zoloft 08/15/13. May consider resume Zoloft if UTI is ruled out.     Unspecified essential hypertension      takes Metoprolol 25mg  bid.               Review of Systems:  Review of Systems  Constitutional: Positive for malaise/fatigue. Negative for fever, chills, weight loss and diaphoresis.  HENT: Positive for hearing loss. Negative for congestion, ear discharge and sore throat.   Eyes: Negative for blurred vision, pain, discharge and redness.  Respiratory: Positive for cough. Negative for sputum production, shortness of breath and wheezing.   Cardiovascular: Negative for chest pain, palpitations, orthopnea, claudication, leg swelling and  PND.  Gastrointestinal: Negative for heartburn, nausea, vomiting, abdominal pain, diarrhea, constipation and blood in stool.       Colostomy  Genitourinary: Positive for frequency (incontinent of bladder). Negative for dysuria, urgency and hematuria.  Musculoskeletal: Positive for back pain. Negative for falls, joint pain, myalgias and neck pain.  Skin: Negative for itching and rash.  Neurological: Negative for dizziness, tingling, tremors, sensory change, speech change, focal weakness, seizures, loss of consciousness, weakness and headaches.  Endo/Heme/Allergies: Negative for environmental allergies and polydipsia. Does not bruise/bleed easily.  Psychiatric/Behavioral: Positive for depression and memory loss. Negative for hallucinations. The patient is nervous/anxious and has insomnia.      Past Medical History  Diagnosis Date  . DJD (degenerative joint disease)   . Compression fracture     t12  . IBS (irritable bowel syndrome)   . Vaginal prolapse   . TMJ (temporomandibular joint disorder)   . Anemia   . Hearing loss   . Blood in stool   . Anxiety   . Cancer     Colon  . Unspecified essential hypertension 11/18/2012  . Coronary atherosclerosis of native coronary artery 10/25/2012  . Long term (current) use of anticoagulants 10/25/2012  . Other pulmonary embolism and infarction 10/15/2012  . Phlebitis and thrombophlebitis of other deep vessels of lower extremities 10/15/2012  . Malignant neoplasm of colon, unspecified site 09/27/2012  . 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 Vitamin D deficiency 09/27/2012  . Hypopotassemia 09/27/2012  . Major depressive disorder, single episode, unspecified 09/27/2012  . Blepharitis, unspecified 09/27/2012  . Diffuse cystic mastopathy 09/27/2012  . Degeneration of intervertebral disc, site unspecified 09/27/2012  . Muscle weakness (generalized) 09/27/2012  . Abnormal involuntary movements(781.0) 09/27/2012  . Edema  09/27/2012  . Unspecified urinary incontinence 09/27/2012  . Colostomy status 09/27/2012   Past Surgical History  Procedure Laterality Date  . Appendectomy    . Vaginal prolapse repair  2005 - approximate  . Cystectomy      back  . Nasal reconstruction with septal repair  1983  . Skin cancer excision    . Arthroscopy left knee  2007  . Colonoscopy    . Total abdominal hysterectomy  1978  . Hernia repair  1980 - approximate  . Eye surgery  [phone]    cataracts bilateral  . Back surgery      mid back  . Laparoscopic sigmoid colectomy  09/20/2012    Procedure: LAPAROSCOPIC SIGMOID COLECTOMY;  Surgeon: Rolm Bookbinder, MD;  Location: [facility];  Service: General;  Laterality: N/A;  Laparoscopic possible Open Sigmoid Colectomy,Colostomy  . Colostomy  09/20/2012    Procedure: COLOSTOMY;  Surgeon: Rolm Bookbinder, MD;  Location: [facility];  Service: General;  Laterality: N/A;   Social History:   reports that she has never smoked. She has never used smokeless tobacco. She reports that she does not drink alcohol or use illicit drugs.  Family History  Problem Relation Age of Onset  . Cancer Son     spine  . Cancer Son     Medications: Patient's Medications  New Prescriptions   No medications on file  Previous Medications   ACETAMINOPHEN (TYLENOL) 500 MG TABLET    Take 1,000 mg by mouth every 4 (four) hours as needed for pain.   ALBUTEROL (PROVENTIL) (2.5 MG/3ML) 0.083% NEBULIZER SOLUTION    Take 2.5 mg by nebulization. Use one via nebulizer every 6 hours a needed for SOB or wheezing   BENZONATATE (TESSALON) 100 MG CAPSULE    Take by mouth 3 (three) times daily.   DOCUSATE SODIUM (COLACE) 100 MG CAPSULE    Take 400 mg by mouth at bedtime as needed. For constipation   ENULOSE 10 GM/15ML SOLN    40 g daily.    ERYTHROMYCIN OPHTHALMIC OINTMENT    Place 1 application into both eyes at bedtime.   FEEDING SUPPLEMENT (ENSURE COMPLETE) LIQD    Take 237 mLs by mouth 2 (two) times daily between  meals.   LORAZEPAM (ATIVAN) 0.5 MG TABLET    Take 0.5 mg by mouth every 6 (six) hours as needed for anxiety.   METOPROLOL TARTRATE (LOPRESSOR) 25 MG TABLET    Take 1 tablet (25 mg total) by mouth 2 (two) times daily.   OMEPRAZOLE (PRILOSEC) 20 MG CAPSULE    Take one daily   RIVAROXABAN (XARELTO) 20 MG TABS TABLET    Take 20 mg by mouth daily.  Modified Medications   No medications on file  Discontinued Medications   ANTISEPTIC ORAL RINSE (BIOTENE) LIQD    15 mLs by Mouth Rinse route as needed.   FERROUS SULFATE 325 (65 FE) MG TABLET    Take 325 mg by mouth daily with breakfast.    ONDANSETRON (ZOFRAN) 4 MG TABLET    Take 1 tablet (4 mg total) by mouth every 6 (six) hours.   POTASSIUM CHLORIDE (MICRO-K) 10 MEQ  CR CAPSULE    Take two  tablest daily for potassium   SERTRALINE (ZOLOFT) 50 MG TABLET    Take 125 mg by mouth every morning.      Physical Exam: Physical Exam  Constitutional: She is oriented to person, place, and time. She appears well-developed and well-nourished.  HENT:  Head: Normocephalic and atraumatic.  Eyes: Conjunctivae and EOM are normal. Pupils are equal, round, and reactive to light.  Neck: Normal range of motion. Neck supple. No JVD present. No tracheal deviation present.  Cardiovascular: Normal rate and regular rhythm.   No murmur heard. Pulmonary/Chest: Effort normal and breath sounds normal. She has no wheezes. She has no rales.  Abdominal: Soft. Bowel sounds are normal. There is no tenderness.  Musculoskeletal: Normal range of motion. She exhibits no edema and no tenderness.  Lymphadenopathy:    She has no cervical adenopathy.  Neurological: She is alert and oriented to person, place, and time. She has normal reflexes. No cranial nerve deficit. She exhibits normal muscle tone. Coordination normal.  Skin: Skin is warm and dry. No rash noted.  Psychiatric: Her affect is inappropriate. Her speech is delayed. She is not agitated, not aggressive, not slowed, not  withdrawn and not actively hallucinating. Thought content is not paranoid and not delusional. Cognition and memory are impaired. She expresses inappropriate judgment. She does not express impulsivity. She exhibits a depressed mood. She exhibits abnormal recent memory.  Pulled off colostomy bag and milking the stoma 09/21/13   (type .physexam) Filed Vitals:   09/22/13 1415  BP: 132/72  Pulse: 65  Temp: 97.5 F (36.4 C)  TempSrc: Tympanic  Resp: 20      Labs reviewed: Basic Metabolic Panel:  Recent Labs  10/11/12 0442 10/12/12 0435  12/05/12 1000 12/23/12 1138 02/03/13 1146 03/24/13 1304 04/10/13 0929 05/01/13  NA 136 140  < > 144 141 142 142 144 140  K 3.8 2.9*  < > 3.9 4.4 4.1 4.4 3.5 4.0  CL 107 106  < > 108* 107 109*  --   --   --   CO2 20 24  < > 24 26 25 25 23   --   GLUCOSE 121* 121*  < > 110* 109* 103* 120 116  --   BUN 20 13  < > 18.4 17.9 19.7 23.2 17.0 19  CREATININE 0.76 0.75  < > 0.8 0.9 0.9 0.9 0.9 0.8  CALCIUM 8.3* 8.2*  < > 9.2 9.5 9.3 9.1 9.5  --   MG  --  1.3*  --   --   --   --   --   --   --   < > = values in this interval not displayed. Liver Function Tests:  Recent Labs  02/03/13 1146 03/24/13 1304 04/10/13 0929 05/01/13  AST 12 12 12  10*  ALT 11 12 11 9   ALKPHOS 80 68 68 62  BILITOT 0.57 0.52 0.75  --   PROT 6.6 6.3* 6.6  --   ALBUMIN 3.2* 3.2* 3.3*  --     Recent Labs  10/07/12 1655 10/08/12 1600 10/08/12 2040  LIPASE 17 16  --   AMYLASE  --   --  55   CBC:  Recent Labs  03/24/13 1304 04/10/13 0929 05/01/13 06/05/13 1019  WBC 5.5 7.1 6.5 7.5  NEUTROABS 2.8 3.1  --  3.6  HGB 10.2* 10.6* 9.3* 11.2*  HCT 30.4* 30.7* 28* 33.2*  MCV 103.0* 102.7*  --  99.6  PLT  228 224 226 254   Assessment/Plan Acute posthemorrhagic anemia Off Iron since 08/15/13-updated CBC 09/15/13 at cancer center.   Bilateral pulmonary embolism Continue Xarelto  GERD (gastroesophageal reflux disease) Stable. Takes Omeprazole 20mg  daily.      Unspecified essential hypertension  takes Metoprolol 25mg  bid.          Memory loss Staff reported the patient is more confused, room had clothing, newspapers, magazine allover, removed colostomy bag+wafer, stated she doesn't know how all this happened. Also she thinks her son caused her room to become disarray. LOts of cuing for ADLs. Obtain Labs from cancer center done 09/15/13. Update MMSE and UA C/S. Staff also reported the patient's behavior has worsened since off Zoloft 08/15/13. May consider resume Zoloft if UTI is ruled out.   Major depressive disorder, single episode, unspecified Disarray room and she stays in bed most of time. May consider to resume Zoloft.     Family/ Staff Communication: observe the patient.   Goals of Care: AL  Labs/tests ordered: UA C/S, MMSE

## 2013-09-22 NOTE — Assessment & Plan Note (Addendum)
Disarray room and she stays in bed most of time. May consider to resume Zoloft.

## 2013-10-10 ENCOUNTER — Encounter: Payer: Self-pay | Admitting: Nurse Practitioner

## 2013-10-10 ENCOUNTER — Non-Acute Institutional Stay: Payer: Medicare Other | Admitting: Nurse Practitioner

## 2013-10-10 DIAGNOSIS — F329 Major depressive disorder, single episode, unspecified: Secondary | ICD-10-CM

## 2013-10-10 DIAGNOSIS — D649 Anemia, unspecified: Secondary | ICD-10-CM

## 2013-10-10 DIAGNOSIS — R413 Other amnesia: Secondary | ICD-10-CM

## 2013-10-10 DIAGNOSIS — K219 Gastro-esophageal reflux disease without esophagitis: Secondary | ICD-10-CM

## 2013-10-10 DIAGNOSIS — I10 Essential (primary) hypertension: Secondary | ICD-10-CM

## 2013-10-10 DIAGNOSIS — Z7901 Long term (current) use of anticoagulants: Secondary | ICD-10-CM

## 2013-10-10 NOTE — Assessment & Plan Note (Signed)
R+L PE and RLE DVT--Xarelto   

## 2013-10-10 NOTE — Assessment & Plan Note (Signed)
Disarray room and she stays in bed most of time. May consider to resume Zoloft.  

## 2013-10-10 NOTE — Progress Notes (Signed)
Patient ID: Erin Hansen, female   DOB: 05-Feb-1926, 80 y.o.   MRN: [id number]   Code Status: DNR  Allergies  Allergen Reactions  . Codeine Nausea Only    Chief Complaint  Patient presents with  . Medical Managment of Chronic Issues    memory  . Acute Visit    HPI: Patient is a 80 y.o. female seen in the AL at [facility] today for  evaluation of confusion, memory lapses, SLUMs 15/30,  and other chronic medical conditions Problem List Items Addressed This Visit   Anemia     continue to improve,  takes Fe 325mg , Hgb 9.2 10/03/12--Hgb 10.4 11/07/12-Hgb 9.3 05/01/13. Update CBC          GERD (gastroesophageal reflux disease)     Stable. Takes Omeprazole 20mg  daily.         Long term (current) use of anticoagulants      R+L PE and RLE DVT--Xarelto      Major depressive disorder, single episode, unspecified (Chronic)     Disarray room and she stays in bed most of time. May consider to resume Zoloft.       Memory loss - Primary     Staff reported the patient is more confused, room had clothing, newspapers, magazine allover, removed colostomy bag+wafer, stated she doesn't know how all this happened. Also she thinks her son caused her room to become disarray. LOts of cuing for ADLs. SLUMs 15/30 and UA C/S  09/29/13 showed no growth. Staff also reported the patient's behavior has worsened since off Zoloft 08/15/13. Will preserve memory 1st with Namenda. May consider mood stabilizer as indicated. Update CBC, CMP, TSH      Unspecified essential hypertension      takes Metoprolol 25mg  bid.                 Review of Systems:  Review of Systems  Constitutional: Positive for malaise/fatigue. Negative for fever, chills, weight loss and diaphoresis.  HENT: Positive for hearing loss. Negative for congestion, ear discharge and sore throat.   Eyes: Negative for blurred vision, pain, discharge and redness.  Respiratory: Positive for cough. Negative for sputum production,  shortness of breath and wheezing.   Cardiovascular: Negative for chest pain, palpitations, orthopnea, claudication, leg swelling and PND.  Gastrointestinal: Negative for heartburn, nausea, vomiting, abdominal pain, diarrhea, constipation and blood in stool.       Colostomy  Genitourinary: Positive for frequency (incontinent of bladder). Negative for dysuria, urgency and hematuria.  Musculoskeletal: Positive for back pain. Negative for falls, joint pain, myalgias and neck pain.  Skin: Negative for itching and rash.  Neurological: Negative for dizziness, tingling, tremors, sensory change, speech change, focal weakness, seizures, loss of consciousness, weakness and headaches.  Endo/Heme/Allergies: Negative for environmental allergies and polydipsia. Does not bruise/bleed easily.  Psychiatric/Behavioral: Positive for depression and memory loss. Negative for hallucinations. The patient is nervous/anxious and has insomnia.      Past Medical History  Diagnosis Date  . DJD (degenerative joint disease)   . Compression fracture     t12  . IBS (irritable bowel syndrome)   . Vaginal prolapse   . TMJ (temporomandibular joint disorder)   . Anemia   . Hearing loss   . Blood in stool   . Anxiety   . Cancer     Colon  . Unspecified essential hypertension 11/18/2012  . Coronary atherosclerosis of native coronary artery 10/25/2012  . Long term (current) use of anticoagulants 10/25/2012  .  Other pulmonary embolism and infarction 10/15/2012  . Phlebitis and thrombophlebitis of other deep vessels of lower extremities 10/15/2012  . Malignant neoplasm of colon, unspecified site 09/27/2012  . 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 Vitamin D deficiency 09/27/2012  . Hypopotassemia 09/27/2012  . Major depressive disorder, single episode, unspecified 09/27/2012  . Blepharitis, unspecified 09/27/2012  . Diffuse cystic mastopathy 09/27/2012  . Degeneration of intervertebral disc, site  unspecified 09/27/2012  . Muscle weakness (generalized) 09/27/2012  . Abnormal involuntary movements(781.0) 09/27/2012  . Edema 09/27/2012  . Unspecified urinary incontinence 09/27/2012  . Colostomy status 09/27/2012   Past Surgical History  Procedure Laterality Date  . Appendectomy    . Vaginal prolapse repair  2005 - approximate  . Cystectomy      back  . Nasal reconstruction with septal repair  1983  . Skin cancer excision    . Arthroscopy left knee  2007  . Colonoscopy    . Total abdominal hysterectomy  1978  . Hernia repair  1980 - approximate  . Eye surgery  [phone]    cataracts bilateral  . Back surgery      mid back  . Laparoscopic sigmoid colectomy  09/20/2012    Procedure: LAPAROSCOPIC SIGMOID COLECTOMY;  Surgeon: Emelia Loron, MD;  Location: [facility];  Service: General;  Laterality: N/A;  Laparoscopic possible Open Sigmoid Colectomy,Colostomy  . Colostomy  09/20/2012    Procedure: COLOSTOMY;  Surgeon: Emelia Loron, MD;  Location: [facility];  Service: General;  Laterality: N/A;   Social History:   reports that she has never smoked. She has never used smokeless tobacco. She reports that she does not drink alcohol or use illicit drugs.  Family History  Problem Relation Age of Onset  . Cancer Son     spine  . Cancer Son     Medications: Patient's Medications  New Prescriptions   No medications on file  Previous Medications   ACETAMINOPHEN (TYLENOL) 500 MG TABLET    Take 1,000 mg by mouth every 4 (four) hours as needed for pain.   ALBUTEROL (PROVENTIL) (2.5 MG/3ML) 0.083% NEBULIZER SOLUTION    Take 2.5 mg by nebulization. Use one via nebulizer every 6 hours a needed for SOB or wheezing   BENZONATATE (TESSALON) 100 MG CAPSULE    Take by mouth 3 (three) times daily.   DOCUSATE SODIUM (COLACE) 100 MG CAPSULE    Take 400 mg by mouth at bedtime as needed. For constipation   ENULOSE 10 GM/15ML SOLN    40 g daily.    ERYTHROMYCIN OPHTHALMIC OINTMENT    Place 1  application into both eyes at bedtime.   FEEDING SUPPLEMENT (ENSURE COMPLETE) LIQD    Take 237 mLs by mouth 2 (two) times daily between meals.   LORAZEPAM (ATIVAN) 0.5 MG TABLET    Take 0.5 mg by mouth every 6 (six) hours as needed for anxiety.   MEMANTINE (NAMENDA) 10 MG TABLET    Take 28 mg by mouth daily.   METOPROLOL TARTRATE (LOPRESSOR) 25 MG TABLET    Take 1 tablet (25 mg total) by mouth 2 (two) times daily.   OMEPRAZOLE (PRILOSEC) 20 MG CAPSULE    Take one daily   RIVAROXABAN (XARELTO) 20 MG TABS TABLET    Take 20 mg by mouth daily.  Modified Medications   No medications on file  Discontinued Medications   No medications on file     Physical Exam: Physical Exam  Constitutional: She is  oriented to person, place, and time. She appears well-developed and well-nourished.  HENT:  Head: Normocephalic and atraumatic.  Eyes: Conjunctivae and EOM are normal. Pupils are equal, round, and reactive to light.  Neck: Normal range of motion. Neck supple. No JVD present. No tracheal deviation present.  Cardiovascular: Normal rate and regular rhythm.   No murmur heard. Pulmonary/Chest: Effort normal and breath sounds normal. She has no wheezes. She has no rales.  Abdominal: Soft. Bowel sounds are normal. There is no tenderness.  Musculoskeletal: Normal range of motion. She exhibits no edema and no tenderness.  Lymphadenopathy:    She has no cervical adenopathy.  Neurological: She is alert and oriented to person, place, and time. She has normal reflexes. No cranial nerve deficit. She exhibits normal muscle tone. Coordination normal.  Skin: Skin is warm and dry. No rash noted.  Psychiatric: Her affect is inappropriate. Her speech is delayed. She is not agitated, not aggressive, not slowed, not withdrawn and not actively hallucinating. Thought content is not paranoid and not delusional. Cognition and memory are impaired. She expresses inappropriate judgment. She does not express impulsivity. She  exhibits a depressed mood. She exhibits abnormal recent memory.  Pulled off colostomy bag and milking the stoma 09/21/13   (type .physexam) Filed Vitals:   10/10/13 1422  BP: 148/92  Pulse: 66  Temp: 96.6 F (35.9 C)  TempSrc: Tympanic  Resp: 18      Labs reviewed: Basic Metabolic Panel:  Recent Labs  10/11/12 0442 10/12/12 0435  12/05/12 1000 12/23/12 1138 02/03/13 1146 03/24/13 1304 04/10/13 0929 05/01/13  NA 136 140  < > 144 141 142 142 144 140  K 3.8 2.9*  < > 3.9 4.4 4.1 4.4 3.5 4.0  CL 107 106  < > 108* 107 109*  --   --   --   CO2 20 24  < > 24 26 25 25 23   --   GLUCOSE 121* 121*  < > 110* 109* 103* 120 116  --   BUN 20 13  < > 18.4 17.9 19.7 23.2 17.0 19  CREATININE 0.76 0.75  < > 0.8 0.9 0.9 0.9 0.9 0.8  CALCIUM 8.3* 8.2*  < > 9.2 9.5 9.3 9.1 9.5  --   MG  --  1.3*  --   --   --   --   --   --   --   < > = values in this interval not displayed. Liver Function Tests:  Recent Labs  02/03/13 1146 03/24/13 1304 04/10/13 0929 05/01/13  AST 12 12 12  10*  ALT 11 12 11 9   ALKPHOS 80 68 68 62  BILITOT 0.57 0.52 0.75  --   PROT 6.6 6.3* 6.6  --   ALBUMIN 3.2* 3.2* 3.3*  --    CBC:  Recent Labs  03/24/13 1304 04/10/13 0929 05/01/13 06/05/13 1019  WBC 5.5 7.1 6.5 7.5  NEUTROABS 2.8 3.1  --  3.6  HGB 10.2* 10.6* 9.3* 11.2*  HCT 30.4* 30.7* 28* 33.2*  MCV 103.0* 102.7*  --  99.6  PLT 228 224 226 254   Assessment/Plan Memory loss Staff reported the patient is more confused, room had clothing, newspapers, magazine allover, removed colostomy bag+wafer, stated she doesn't know how all this happened. Also she thinks her son caused her room to become disarray. LOts of cuing for ADLs. SLUMs 15/30 and UA C/S  09/29/13 showed no growth. Staff also reported the patient's behavior has worsened since off Zoloft  08/15/13. Will preserve memory 1st with Namenda. May consider mood stabilizer as indicated. Update CBC, CMP, TSH    GERD (gastroesophageal reflux  disease) Stable. Takes Omeprazole 20mg  daily.       Long term (current) use of anticoagulants  R+L PE and RLE DVT--Xarelto    Unspecified essential hypertension  takes Metoprolol 25mg  bid.            Major depressive disorder, single episode, unspecified Disarray room and she stays in bed most of time. May consider to resume Zoloft.     Anemia continue to improve,  takes Fe 325mg , Hgb 9.2 10/03/12--Hgb 10.4 11/07/12-Hgb 9.3 05/01/13. Update CBC          Family/ Staff Communication: observe the patient.   Goals of Care: AL  Labs/tests ordered: 09/29/13 urine culture no growth. CBC. CMP, TSH

## 2013-10-10 NOTE — Assessment & Plan Note (Signed)
continue to improve,  takes Fe 325mg , Hgb 9.2 10/03/12--Hgb 10.4 11/07/12-Hgb 9.3 05/01/13. Update CBC

## 2013-10-10 NOTE — Assessment & Plan Note (Signed)
takes Metoprolol 25mg bid.         

## 2013-10-10 NOTE — Assessment & Plan Note (Addendum)
Staff reported the patient is more confused, room had clothing, newspapers, magazine allover, removed colostomy bag+wafer, stated she doesn't know how all this happened. Also she thinks her son caused her room to become disarray. LOts of cuing for ADLs. SLUMs 15/30 and UA C/S  09/29/13 showed no growth. Staff also reported the patient's behavior has worsened since off Zoloft 08/15/13. Will preserve memory 1st with Namenda. May consider mood stabilizer as indicated. Update CBC, CMP, TSH

## 2013-10-10 NOTE — Assessment & Plan Note (Signed)
Stable. Takes Omeprazole 20mg daily.  

## 2013-10-12 LAB — BASIC METABOLIC PANEL
BUN: 18 mg/dL (ref 4–21)
Creatinine: 0.7 mg/dL (ref 0.5–1.1)
GLUCOSE: 89 mg/dL
Potassium: 4 mmol/L (ref 3.4–5.3)
Sodium: 141 mmol/L (ref 137–147)

## 2013-10-12 LAB — CBC AND DIFFERENTIAL
HCT: 30 % — AB (ref 36–46)
Hemoglobin: 10 g/dL — AB (ref 12.0–16.0)
Platelets: 231 10*3/uL (ref 150–399)
WBC: 5.9 10*3/mL

## 2013-10-12 LAB — TSH: TSH: 1.98 u[IU]/mL (ref 0.41–5.90)

## 2013-10-12 LAB — HEPATIC FUNCTION PANEL
ALT: 9 U/L (ref 7–35)
AST: 12 U/L — AB (ref 13–35)
Alkaline Phosphatase: 52 U/L (ref 25–125)
Bilirubin, Total: 0.6 mg/dL

## 2013-10-20 ENCOUNTER — Encounter (INDEPENDENT_AMBULATORY_CARE_PROVIDER_SITE_OTHER): Payer: Medicare Other | Admitting: General Surgery

## 2013-10-24 ENCOUNTER — Encounter (INDEPENDENT_AMBULATORY_CARE_PROVIDER_SITE_OTHER): Payer: Medicare Other | Admitting: General Surgery

## 2013-11-03 ENCOUNTER — Encounter (INDEPENDENT_AMBULATORY_CARE_PROVIDER_SITE_OTHER): Payer: Medicare Other | Admitting: General Surgery

## 2013-11-03 ENCOUNTER — Non-Acute Institutional Stay: Payer: Medicare Other | Admitting: Nurse Practitioner

## 2013-11-03 ENCOUNTER — Encounter: Payer: Self-pay | Admitting: Nurse Practitioner

## 2013-11-03 DIAGNOSIS — I10 Essential (primary) hypertension: Secondary | ICD-10-CM

## 2013-11-03 DIAGNOSIS — R413 Other amnesia: Secondary | ICD-10-CM

## 2013-11-03 DIAGNOSIS — D649 Anemia, unspecified: Secondary | ICD-10-CM

## 2013-11-03 DIAGNOSIS — K219 Gastro-esophageal reflux disease without esophagitis: Secondary | ICD-10-CM

## 2013-11-03 DIAGNOSIS — M549 Dorsalgia, unspecified: Secondary | ICD-10-CM | POA: Insufficient documentation

## 2013-11-03 HISTORY — PX: VERTEBROPLASTY: SHX113

## 2013-11-03 NOTE — Assessment & Plan Note (Signed)
Staff reported the patient is more confused, room had clothing, newspapers, magazine allover, removed colostomy bag+wafer, stated she doesn't know how all this happened. Also she thinks her son caused her room to become disarray. LOts of cuing for ADLs. SLUMs 15/30 and UA C/S  09/29/13 showed no growth. Staff also reported the patient's behavior has worsened since off Zoloft 08/15/13. Tolerated memory 1st with Namenda. May consider mood stabilizer as indicated. Update CBC, CMP, TSH 10/12/13 unremarkable except Hgb 10.0. Seems better with Ativan hs to help her to rest.

## 2013-11-03 NOTE — Assessment & Plan Note (Signed)
Controlled, takes Metoprolol 25mg bid.   

## 2013-11-03 NOTE — Assessment & Plan Note (Signed)
Hgb 10.0 10/12/13

## 2013-11-03 NOTE — Progress Notes (Signed)
Patient ID: Erin Hansen, female   DOB: 01-May-1926, 80 y.o.   MRN: [id number]   Code Status: DNR  Allergies  Allergen Reactions  . Codeine Nausea Only    Chief Complaint  Patient presents with  . Medical Managment of Chronic Issues    acute back pain.   . Acute Visit    HPI: Patient is a 80 y.o. female seen in the AL at [facility] today for  evaluation of mid back pain, confusion, memory lapses, SLUMs 15/30,  and other chronic medical conditions Problem List Items Addressed This Visit   Unspecified essential hypertension     Controlled, takes Metoprolol 25mg  bid.       Pain of mid back     Onset about 2-3 days ago, new.  Mid back on and off-positional especially when she is bed to reach over for things. Not re-produced upon today's examination. X-ray of thoracic spine to evaluate further.      Memory loss     Staff reported the patient is more confused, room had clothing, newspapers, magazine allover, removed colostomy bag+wafer, stated she doesn't know how all this happened. Also she thinks her son caused her room to become disarray. LOts of cuing for ADLs. SLUMs 15/30 and UA C/S  09/29/13 showed no growth. Staff also reported the patient's behavior has worsened since off Zoloft 08/15/13. Tolerated memory 1st with Namenda. May consider mood stabilizer as indicated. Update CBC, CMP, TSH 10/12/13 unremarkable except Hgb 10.0. Seems better with Ativan hs to help her to rest.        GERD (gastroesophageal reflux disease)     Stable. Takes Omeprazole 20mg  daily.       Anemia - Primary     Hgb 10.0 10/12/13       Review of Systems:  Review of Systems  Constitutional: Positive for malaise/fatigue. Negative for fever, chills, weight loss and diaphoresis.  HENT: Positive for hearing loss. Negative for congestion, ear discharge and sore throat.   Eyes: Negative for blurred vision, pain, discharge and redness.  Respiratory: Positive for cough. Negative for sputum production,  shortness of breath and wheezing.   Cardiovascular: Negative for chest pain, palpitations, orthopnea, claudication, leg swelling and PND.  Gastrointestinal: Negative for heartburn, nausea, vomiting, abdominal pain, diarrhea, constipation and blood in stool.       Colostomy  Genitourinary: Positive for frequency (incontinent of bladder). Negative for dysuria, urgency and hematuria.  Musculoskeletal: Positive for back pain. Negative for falls, joint pain, myalgias and neck pain.       Mid back on and off-positional especially when she is bed to reach over for things. Not re-produced upon today's examination.   Skin: Negative for itching and rash.  Neurological: Negative for dizziness, tingling, tremors, sensory change, speech change, focal weakness, seizures, loss of consciousness, weakness and headaches.  Endo/Heme/Allergies: Negative for environmental allergies and polydipsia. Does not bruise/bleed easily.  Psychiatric/Behavioral: Positive for depression and memory loss. Negative for hallucinations. The patient is nervous/anxious and has insomnia.      Past Medical History  Diagnosis Date  . DJD (degenerative joint disease)   . Compression fracture     t12  . IBS (irritable bowel syndrome)   . Vaginal prolapse   . TMJ (temporomandibular joint disorder)   . Anemia   . Hearing loss   . Blood in stool   . Anxiety   . Cancer     Colon  . Unspecified essential hypertension 11/18/2012  . Coronary atherosclerosis  of native coronary artery 10/25/2012  . Long term (current) use of anticoagulants 10/25/2012  . Other pulmonary embolism and infarction 10/15/2012  . Phlebitis and thrombophlebitis of other deep vessels of lower extremities 10/15/2012  . Malignant neoplasm of colon, unspecified site 09/27/2012  . 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 Vitamin D deficiency 09/27/2012  . Hypopotassemia 09/27/2012  . Major depressive disorder, single episode, unspecified  09/27/2012  . Blepharitis, unspecified 09/27/2012  . Diffuse cystic mastopathy 09/27/2012  . Degeneration of intervertebral disc, site unspecified 09/27/2012  . Muscle weakness (generalized) 09/27/2012  . Abnormal involuntary movements(781.0) 09/27/2012  . Edema 09/27/2012  . Unspecified urinary incontinence 09/27/2012  . Colostomy status 09/27/2012   Past Surgical History  Procedure Laterality Date  . Appendectomy    . Vaginal prolapse repair  2005 - approximate  . Cystectomy      back  . Nasal reconstruction with septal repair  1983  . Skin cancer excision    . Arthroscopy left knee  2007  . Colonoscopy    . Total abdominal hysterectomy  1978  . Hernia repair  1980 - approximate  . Eye surgery  [phone]    cataracts bilateral  . Back surgery      mid back  . Laparoscopic sigmoid colectomy  09/20/2012    Procedure: LAPAROSCOPIC SIGMOID COLECTOMY;  Surgeon: Emelia Loron, MD;  Location: [facility];  Service: General;  Laterality: N/A;  Laparoscopic possible Open Sigmoid Colectomy,Colostomy  . Colostomy  09/20/2012    Procedure: COLOSTOMY;  Surgeon: Emelia Loron, MD;  Location: [facility];  Service: General;  Laterality: N/A;   Social History:   reports that she has never smoked. She has never used smokeless tobacco. She reports that she does not drink alcohol or use illicit drugs.  Family History  Problem Relation Age of Onset  . Cancer Son     spine  . Cancer Son     Medications: Patient's Medications  New Prescriptions   No medications on file  Previous Medications   ACETAMINOPHEN (TYLENOL) 500 MG TABLET    Take 1,000 mg by mouth every 4 (four) hours as needed for pain.   ALBUTEROL (PROVENTIL) (2.5 MG/3ML) 0.083% NEBULIZER SOLUTION    Take 2.5 mg by nebulization. Use one via nebulizer every 6 hours a needed for SOB or wheezing   BENZONATATE (TESSALON) 100 MG CAPSULE    Take by mouth 3 (three) times daily.   DOCUSATE SODIUM (COLACE) 100 MG CAPSULE    Take 400 mg by mouth at  bedtime as needed. For constipation   ENULOSE 10 GM/15ML SOLN    40 g daily.    ERYTHROMYCIN OPHTHALMIC OINTMENT    Place 1 application into both eyes at bedtime.   FEEDING SUPPLEMENT (ENSURE COMPLETE) LIQD    Take 237 mLs by mouth 2 (two) times daily between meals.   LORAZEPAM (ATIVAN) 0.5 MG TABLET    Take 0.5 mg by mouth every 6 (six) hours as needed for anxiety.   MEMANTINE (NAMENDA) 10 MG TABLET    Take 28 mg by mouth daily.   METOPROLOL TARTRATE (LOPRESSOR) 25 MG TABLET    Take 1 tablet (25 mg total) by mouth 2 (two) times daily.   OMEPRAZOLE (PRILOSEC) 20 MG CAPSULE    Take one daily   RIVAROXABAN (XARELTO) 20 MG TABS TABLET    Take 20 mg by mouth daily.  Modified Medications   No medications on file  Discontinued Medications  No medications on file     Physical Exam: Physical Exam  Constitutional: She is oriented to person, place, and time. She appears well-developed and well-nourished.  HENT:  Head: Normocephalic and atraumatic.  Eyes: Conjunctivae and EOM are normal. Pupils are equal, round, and reactive to light.  Neck: Normal range of motion. Neck supple. No JVD present. No tracheal deviation present.  Cardiovascular: Normal rate and regular rhythm.   No murmur heard. Pulmonary/Chest: Effort normal and breath sounds normal. She has no wheezes. She has no rales.  Abdominal: Soft. Bowel sounds are normal. There is no tenderness.  Musculoskeletal: Normal range of motion. She exhibits no edema and no tenderness.  Mid back on and off-positional especially when she is bed to reach over for things. Not re-produced upon today's examination.    Lymphadenopathy:    She has no cervical adenopathy.  Neurological: She is alert and oriented to person, place, and time. She has normal reflexes. No cranial nerve deficit. She exhibits normal muscle tone. Coordination normal.  Skin: Skin is warm and dry. No rash noted.  Psychiatric: Her affect is inappropriate. Her speech is delayed. She  is not agitated, not aggressive, not slowed, not withdrawn and not actively hallucinating. Thought content is not paranoid and not delusional. Cognition and memory are impaired. She expresses inappropriate judgment. She does not express impulsivity. She exhibits a depressed mood. She exhibits abnormal recent memory.  Pulled off colostomy bag and milking the stoma 09/21/13   (type .physexam) Filed Vitals:   11/03/13 1518  BP: 100/68  Pulse: 58  Temp: 97.8 F (36.6 C)  TempSrc: Tympanic  Resp: 18      Labs reviewed: Basic Metabolic Panel:  Recent Labs  12/05/12 1000 12/23/12 1138 02/03/13 1146 03/24/13 1304 04/10/13 0929 05/01/13 10/12/13  NA 144 141 142 142 144 140 141  K 3.9 4.4 4.1 4.4 3.5 4.0 4.0  CL 108* 107 109*  --   --   --   --   CO2 24 26 25 25 23   --   --   GLUCOSE 110* 109* 103* 120 116  --   --   BUN 18.4 17.9 19.7 23.2 17.0 19 18  CREATININE 0.8 0.9 0.9 0.9 0.9 0.8 0.7  CALCIUM 9.2 9.5 9.3 9.1 9.5  --   --   TSH  --   --   --   --   --   --  1.98   Liver Function Tests:  Recent Labs  02/03/13 1146 03/24/13 1304 04/10/13 0929 05/01/13 10/12/13  AST 12 12 12  10* 12*  ALT 11 12 11 9 9   ALKPHOS 80 68 68 62 52  BILITOT 0.57 0.52 0.75  --   --   PROT 6.6 6.3* 6.6  --   --   ALBUMIN 3.2* 3.2* 3.3*  --   --    CBC:  Recent Labs  03/24/13 1304 04/10/13 0929 05/01/13 06/05/13 1019 10/12/13  WBC 5.5 7.1 6.5 7.5 5.9  NEUTROABS 2.8 3.1  --  3.6  --   HGB 10.2* 10.6* 9.3* 11.2* 10.0*  HCT 30.4* 30.7* 28* 33.2* 30*  MCV 103.0* 102.7*  --  99.6  --   PLT 228 224 226 254 231   Assessment/Plan Anemia Hgb 10.0 10/12/13  Pain of mid back Onset about 2-3 days ago, new.  Mid back on and off-positional especially when she is bed to reach over for things. Not re-produced upon today's examination. X-ray of thoracic spine to evaluate  further.    Unspecified essential hypertension Controlled, takes Metoprolol 25mg  bid.     Memory loss Staff reported the  patient is more confused, room had clothing, newspapers, magazine allover, removed colostomy bag+wafer, stated she doesn't know how all this happened. Also she thinks her son caused her room to become disarray. LOts of cuing for ADLs. SLUMs 15/30 and UA C/S  09/29/13 showed no growth. Staff also reported the patient's behavior has worsened since off Zoloft 08/15/13. Tolerated memory 1st with Namenda. May consider mood stabilizer as indicated. Update CBC, CMP, TSH 10/12/13 unremarkable except Hgb 10.0. Seems better with Ativan hs to help her to rest.      GERD (gastroesophageal reflux disease) Stable. Takes Omeprazole 20mg  daily.       Family/ Staff Communication: observe the patient.   Goals of Care: SNF for higher level of care.   Labs/tests ordered: X-ray thoracic spine.

## 2013-11-03 NOTE — Assessment & Plan Note (Signed)
Stable. Takes Omeprazole 20mg daily.  

## 2013-11-03 NOTE — Assessment & Plan Note (Signed)
Onset about 2-3 days ago, new.  Mid back on and off-positional especially when she is bed to reach over for things. Not re-produced upon today's examination. X-ray of thoracic spine to evaluate further.

## 2013-11-17 ENCOUNTER — Non-Acute Institutional Stay: Payer: Medicare Other | Admitting: Nurse Practitioner

## 2013-11-17 ENCOUNTER — Encounter: Payer: Self-pay | Admitting: Nurse Practitioner

## 2013-11-17 DIAGNOSIS — R413 Other amnesia: Secondary | ICD-10-CM

## 2013-11-17 DIAGNOSIS — D649 Anemia, unspecified: Secondary | ICD-10-CM

## 2013-11-17 DIAGNOSIS — I10 Essential (primary) hypertension: Secondary | ICD-10-CM

## 2013-11-17 DIAGNOSIS — F329 Major depressive disorder, single episode, unspecified: Secondary | ICD-10-CM

## 2013-11-17 DIAGNOSIS — K219 Gastro-esophageal reflux disease without esophagitis: Secondary | ICD-10-CM

## 2013-11-17 DIAGNOSIS — R110 Nausea: Secondary | ICD-10-CM

## 2013-11-17 NOTE — Progress Notes (Signed)
Patient ID: Erin Hansen, female   DOB: 1926-01-16, 80 y.o.   MRN: [id number]   Code Status: DNR  Allergies  Allergen Reactions  . Codeine Nausea Only    Chief Complaint  Patient presents with  . Dementia  . Medical Managment of Chronic Issues  . Acute Visit    depression, nausea    HPI: Patient is a 80 y.o. female seen in the AL at [facility] today for  evaluation of Nausea, depression(no energy, feeling down, tearful), memory lapses, SLUMs 15/30,  and other chronic medical conditions Problem List Items Addressed This Visit   Unspecified essential hypertension     Controlled, takes Metoprolol 25mg  bid.       Nausea alone     Will obtain CBC, CMP, UA C/S, schedule Zofran 4mg  tid for 3 days, observe the patient.     Memory loss     Staff reported the patient is more confused, room had clothing, newspapers, magazine allover, removed colostomy bag+wafer, stated she doesn't know how all this happened. Also she thinks her son caused her room to become disarray. LOts of cuing for ADLs. SLUMs 15/30 and UA C/S  09/29/13 showed no growth. Staff also reported the patient's behavior has worsened since off Zoloft 08/15/13. Tolerated Namenda. May consider mood stabilizer as indicated. Update CBC, CMP, TSH 10/12/13 unremarkable except Hgb 10.0. ? Efficacy of Lorazepam.        Major depressive disorder, single episode, unspecified - Primary (Chronic)     C/o not feeling well-no energy, depressed(off Zoloft 07/2012), not sleep at night, trouble with memory(recently started on Namenda), c/o nausea since this am. Will try Cymbalta 30mg  daily. Check CBC, CMP, UA C/S, Zofran tid for 3 days. Observe the patient.     Relevant Medications      DULoxetine (CYMBALTA) 30 MG capsule   GERD (gastroesophageal reflux disease)     Takes Omeprazole 20mg  daily. C/o nausea today. Will schedule Zofran 4mg  tid for 3 days. Workup: CBC, CMP, UA C/S     Anemia     Hgb 10.0 10/12/13, update CBC         Review of Systems:  Review of Systems  Constitutional: Positive for malaise/fatigue. Negative for fever, chills, weight loss and diaphoresis.  HENT: Positive for hearing loss. Negative for congestion, ear discharge and sore throat.   Eyes: Negative for blurred vision, pain, discharge and redness.  Respiratory: Positive for cough. Negative for sputum production, shortness of breath and wheezing.   Cardiovascular: Negative for chest pain, palpitations, orthopnea, claudication, leg swelling and PND.  Gastrointestinal: Positive for nausea. Negative for heartburn, vomiting, abdominal pain, diarrhea, constipation and blood in stool.       Colostomy  Genitourinary: Positive for frequency (incontinent of bladder). Negative for dysuria, urgency and hematuria.  Musculoskeletal: Positive for back pain. Negative for falls, joint pain, myalgias and neck pain.       Mid back on and off-positional especially when she is bed to reach over for things. Not re-produced upon today's examination.   Skin: Negative for itching and rash.  Neurological: Negative for dizziness, tingling, tremors, sensory change, speech change, focal weakness, seizures, loss of consciousness, weakness and headaches.  Endo/Heme/Allergies: Negative for environmental allergies and polydipsia. Does not bruise/bleed easily.  Psychiatric/Behavioral: Positive for depression and memory loss. Negative for hallucinations. The patient is nervous/anxious and has insomnia.      Past Medical History  Diagnosis Date  . DJD (degenerative joint disease)   . Compression fracture  t12  . IBS (irritable bowel syndrome)   . Vaginal prolapse   . TMJ (temporomandibular joint disorder)   . Anemia   . Hearing loss   . Blood in stool   . Anxiety   . Cancer     Colon  . Unspecified essential hypertension 11/18/2012  . Coronary atherosclerosis of native coronary artery 10/25/2012  . Long term (current) use of anticoagulants 10/25/2012  . Other  pulmonary embolism and infarction 10/15/2012  . Phlebitis and thrombophlebitis of other deep vessels of lower extremities 10/15/2012  . Malignant neoplasm of colon, unspecified site 09/27/2012  . 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 Vitamin D deficiency 09/27/2012  . Hypopotassemia 09/27/2012  . Major depressive disorder, single episode, unspecified 09/27/2012  . Blepharitis, unspecified 09/27/2012  . Diffuse cystic mastopathy 09/27/2012  . Degeneration of intervertebral disc, site unspecified 09/27/2012  . Muscle weakness (generalized) 09/27/2012  . Abnormal involuntary movements(781.0) 09/27/2012  . Edema 09/27/2012  . Unspecified urinary incontinence 09/27/2012  . Colostomy status 09/27/2012   Past Surgical History  Procedure Laterality Date  . Appendectomy    . Vaginal prolapse repair  2005 - approximate  . Cystectomy      back  . Nasal reconstruction with septal repair  1983  . Skin cancer excision    . Arthroscopy left knee  2007  . Colonoscopy    . Total abdominal hysterectomy  1978  . Hernia repair  1980 - approximate  . Eye surgery  [phone]    cataracts bilateral  . Back surgery      mid back  . Laparoscopic sigmoid colectomy  09/20/2012    Procedure: LAPAROSCOPIC SIGMOID COLECTOMY;  Surgeon: Rolm Bookbinder, MD;  Location: [facility];  Service: General;  Laterality: N/A;  Laparoscopic possible Open Sigmoid Colectomy,Colostomy  . Colostomy  09/20/2012    Procedure: COLOSTOMY;  Surgeon: Rolm Bookbinder, MD;  Location: [facility];  Service: General;  Laterality: N/A;   Social History:   reports that she has never smoked. She has never used smokeless tobacco. She reports that she does not drink alcohol or use illicit drugs.  Family History  Problem Relation Age of Onset  . Cancer Son     spine  . Cancer Son     Medications: Patient's Medications  New Prescriptions   No medications on file  Previous Medications   ACETAMINOPHEN (TYLENOL) 500 MG  TABLET    Take 1,000 mg by mouth every 4 (four) hours as needed for pain.   ALBUTEROL (PROVENTIL) (2.5 MG/3ML) 0.083% NEBULIZER SOLUTION    Take 2.5 mg by nebulization. Use one via nebulizer every 6 hours a needed for SOB or wheezing   BENZONATATE (TESSALON) 100 MG CAPSULE    Take by mouth 3 (three) times daily.   DOCUSATE SODIUM (COLACE) 100 MG CAPSULE    Take 400 mg by mouth at bedtime as needed. For constipation   DULOXETINE (CYMBALTA) 30 MG CAPSULE    Take 30 mg by mouth daily.   ENULOSE 10 GM/15ML SOLN    40 g daily.    ERYTHROMYCIN OPHTHALMIC OINTMENT    Place 1 application into both eyes at bedtime.   FEEDING SUPPLEMENT (ENSURE COMPLETE) LIQD    Take 237 mLs by mouth 2 (two) times daily between meals.   LORAZEPAM (ATIVAN) 0.5 MG TABLET    Take 0.5 mg by mouth every 6 (six) hours as needed for anxiety.   MEMANTINE (NAMENDA) 10 MG TABLET  Take 28 mg by mouth daily.   METOPROLOL TARTRATE (LOPRESSOR) 25 MG TABLET    Take 1 tablet (25 mg total) by mouth 2 (two) times daily.   OMEPRAZOLE (PRILOSEC) 20 MG CAPSULE    Take one daily   RIVAROXABAN (XARELTO) 20 MG TABS TABLET    Take 20 mg by mouth daily.  Modified Medications   No medications on file  Discontinued Medications   No medications on file     Physical Exam: Physical Exam  Constitutional: She is oriented to person, place, and time. She appears well-developed and well-nourished.  HENT:  Head: Normocephalic and atraumatic.  Eyes: Conjunctivae and EOM are normal. Pupils are equal, round, and reactive to light.  Neck: Normal range of motion. Neck supple. No JVD present. No tracheal deviation present.  Cardiovascular: Normal rate and regular rhythm.   No murmur heard. Pulmonary/Chest: Effort normal and breath sounds normal. She has no wheezes. She has no rales.  Abdominal: Soft. Bowel sounds are normal. There is no tenderness.  Musculoskeletal: Normal range of motion. She exhibits no edema and no tenderness.  Mid back on and  off-positional especially when she is bed to reach over for things. Not re-produced upon today's examination.    Lymphadenopathy:    She has no cervical adenopathy.  Neurological: She is alert and oriented to person, place, and time. She has normal reflexes. No cranial nerve deficit. She exhibits normal muscle tone. Coordination normal.  Skin: Skin is warm and dry. No rash noted.  Psychiatric: Her affect is inappropriate. Her speech is delayed. She is not agitated, not aggressive, not slowed, not withdrawn and not actively hallucinating. Thought content is not paranoid and not delusional. Cognition and memory are impaired. She expresses inappropriate judgment. She does not express impulsivity. She exhibits a depressed mood. She exhibits abnormal recent memory.  Pulled off colostomy bag and milking the stoma 09/21/13. Tearful.    (type .physexam) Filed Vitals:   11/17/13 1539  BP: 98/68  Pulse: 79  Temp: 97.9 F (36.6 C)  TempSrc: Tympanic  Resp: 16      Labs reviewed: Basic Metabolic Panel:  Recent Labs  12/05/12 1000 12/23/12 1138 02/03/13 1146 03/24/13 1304 04/10/13 0929 05/01/13 10/12/13  NA 144 141 142 142 144 140 141  K 3.9 4.4 4.1 4.4 3.5 4.0 4.0  CL 108* 107 109*  --   --   --   --   CO2 24 26 25 25 23   --   --   GLUCOSE 110* 109* 103* 120 116  --   --   BUN 18.4 17.9 19.7 23.2 17.0 19 18  CREATININE 0.8 0.9 0.9 0.9 0.9 0.8 0.7  CALCIUM 9.2 9.5 9.3 9.1 9.5  --   --   TSH  --   --   --   --   --   --  1.98   Liver Function Tests:  Recent Labs  02/03/13 1146 03/24/13 1304 04/10/13 0929 05/01/13 10/12/13  AST 12 12 12  10* 12*  ALT 11 12 11 9 9   ALKPHOS 80 68 68 62 52  BILITOT 0.57 0.52 0.75  --   --   PROT 6.6 6.3* 6.6  --   --   ALBUMIN 3.2* 3.2* 3.3*  --   --    CBC:  Recent Labs  03/24/13 1304 04/10/13 0929 05/01/13 06/05/13 1019 10/12/13  WBC 5.5 7.1 6.5 7.5 5.9  NEUTROABS 2.8 3.1  --  3.6  --   HGB  10.2* 10.6* 9.3* 11.2* 10.0*  HCT 30.4* 30.7*  28* 33.2* 30*  MCV 103.0* 102.7*  --  99.6  --   PLT 228 224 226 254 231   Assessment/Plan Major depressive disorder, single episode, unspecified C/o not feeling well-no energy, depressed(off Zoloft 07/2012), not sleep at night, trouble with memory(recently started on Namenda), c/o nausea since this am. Will try Cymbalta 30mg  daily. Check CBC, CMP, UA C/S, Zofran tid for 3 days. Observe the patient.   Nausea alone Will obtain CBC, CMP, UA C/S, schedule Zofran 4mg  tid for 3 days, observe the patient.   GERD (gastroesophageal reflux disease) Takes Omeprazole 20mg  daily. C/o nausea today. Will schedule Zofran 4mg  tid for 3 days. Workup: CBC, CMP, UA C/S   Memory loss Staff reported the patient is more confused, room had clothing, newspapers, magazine allover, removed colostomy bag+wafer, stated she doesn't know how all this happened. Also she thinks her son caused her room to become disarray. LOts of cuing for ADLs. SLUMs 15/30 and UA C/S  09/29/13 showed no growth. Staff also reported the patient's behavior has worsened since off Zoloft 08/15/13. Tolerated Namenda. May consider mood stabilizer as indicated. Update CBC, CMP, TSH 10/12/13 unremarkable except Hgb 10.0. ? Efficacy of Lorazepam.      Anemia Hgb 10.0 10/12/13, update CBC   Unspecified essential hypertension Controlled, takes Metoprolol 25mg  bid.       Family/ Staff Communication: observe the patient.   Goals of Care: SNF for higher level of care.   Labs/tests ordered: CBC, CMP, UA C/S

## 2013-11-18 DIAGNOSIS — R110 Nausea: Secondary | ICD-10-CM | POA: Insufficient documentation

## 2013-11-18 NOTE — Assessment & Plan Note (Addendum)
Will obtain CBC, CMP, UA C/S, schedule Zofran 4mg  tid for 3 days, observe the patient.

## 2013-11-18 NOTE — Assessment & Plan Note (Signed)
Takes Omeprazole 20mg  daily. C/o nausea today. Will schedule Zofran 4mg  tid for 3 days. Workup: CBC, CMP, UA C/S

## 2013-11-18 NOTE — Assessment & Plan Note (Signed)
Staff reported the patient is more confused, room had clothing, newspapers, magazine allover, removed colostomy bag+wafer, stated she doesn't know how all this happened. Also she thinks her son caused her room to become disarray. LOts of cuing for ADLs. SLUMs 15/30 and UA C/S  09/29/13 showed no growth. Staff also reported the patient's behavior has worsened since off Zoloft 08/15/13. Tolerated Namenda. May consider mood stabilizer as indicated. Update CBC, CMP, TSH 10/12/13 unremarkable except Hgb 10.0. ? Efficacy of Lorazepam.

## 2013-11-18 NOTE — Assessment & Plan Note (Signed)
C/o not feeling well-no energy, depressed(off Zoloft 07/2012), not sleep at night, trouble with memory(recently started on Namenda), c/o nausea since this am. Will try Cymbalta 30mg  daily. Check CBC, CMP, UA C/S, Zofran tid for 3 days. Observe the patient.

## 2013-11-18 NOTE — Assessment & Plan Note (Signed)
Hgb 10.0 10/12/13, update CBC

## 2013-11-18 NOTE — Assessment & Plan Note (Signed)
Controlled, takes Metoprolol 25mg bid.   

## 2013-11-20 LAB — BASIC METABOLIC PANEL
BUN: 18 mg/dL (ref 4–21)
Creatinine: 0.8 mg/dL (ref 0.5–1.1)
Glucose: 100 mg/dL
Potassium: 3.8 mmol/L (ref 3.4–5.3)
Sodium: 141 mmol/L (ref 137–147)

## 2013-11-20 LAB — CBC AND DIFFERENTIAL
HEMATOCRIT: 33 % — AB (ref 36–46)
HEMOGLOBIN: 11.1 g/dL — AB (ref 12.0–16.0)
PLATELETS: 255 10*3/uL (ref 150–399)
WBC: 6.6 10^3/mL

## 2013-11-20 LAB — HEPATIC FUNCTION PANEL
ALT: 11 U/L (ref 7–35)
AST: 12 U/L — AB (ref 13–35)
Alkaline Phosphatase: 60 U/L (ref 25–125)
BILIRUBIN, TOTAL: 0.6 mg/dL

## 2013-11-24 ENCOUNTER — Encounter (INDEPENDENT_AMBULATORY_CARE_PROVIDER_SITE_OTHER): Payer: Self-pay | Admitting: General Surgery

## 2013-11-24 ENCOUNTER — Ambulatory Visit (INDEPENDENT_AMBULATORY_CARE_PROVIDER_SITE_OTHER): Payer: Medicare Other | Admitting: General Surgery

## 2013-11-24 VITALS — BP 102/60 | HR 74 | Resp 16 | Wt 147.0 lb

## 2013-11-24 DIAGNOSIS — K439 Ventral hernia without obstruction or gangrene: Secondary | ICD-10-CM

## 2013-11-24 NOTE — Progress Notes (Signed)
Subjective:     Patient ID: Erin Hansen, female   DOB: 09-Sep-1925, 80 y.o.   MRN: [id number]  HPI This is an 80 year old female who presented with a sigmoid colon cancer. She was taken to the operating room and underwent a laparoscopic sigmoid colectomy with an end colostomy due to her incontinence issues preoperatively. Her pathology showed a stage III 6 cm colorectal adenocarcinoma focally extending into pericolonic connective tissue; negative margins; lymphovascular invasion present; no perineural invasion; one of 15 lymph nodes positive for metastatic carcinoma (pT3, pN1a).  She also has had bilateral pulmonary emboli. She is at several experience with leaking of her bag.  She also has a bulge near her colostomy site. Her colostomy is functioning well and she has no other complaints today. She has some occasional abdominal pains.  She has been using some stool softeners also. This area may have been getting bigger   Review of Systems     Objective:   Physical Exam Reducible parastomal hernia no real difference that I can tell, colostomy functioning well. She has a good seal around it. Abdomen nontender     Assessment:     Stage III colon cancer  Parastomal hernia with colostomy     Plan:     We discussed the fact that she has a parastomal hernia. The standard would be to repair this. However I think for her to this would not be the best idea. I think that the repair and the risks involved with it far outweigh any benefit to she's going to get from the repair. The quality of life issues are a concern and I do not think that this would be beneficial for her. She is a small risk of needing an emergency surgery or this may get worse. I think that continued observation would be the best plan. I think that an abdominal binder where there is a cut out at the site of the ostomy may also be very helpful. I would also continue the use of stool softeners as needed for her. I will be happy to see her  back as needed.

## 2013-11-28 ENCOUNTER — Non-Acute Institutional Stay (SKILLED_NURSING_FACILITY): Payer: Medicare Other | Admitting: Nurse Practitioner

## 2013-11-28 ENCOUNTER — Encounter: Payer: Self-pay | Admitting: Nurse Practitioner

## 2013-11-28 DIAGNOSIS — K439 Ventral hernia without obstruction or gangrene: Secondary | ICD-10-CM

## 2013-11-28 DIAGNOSIS — I10 Essential (primary) hypertension: Secondary | ICD-10-CM

## 2013-11-28 DIAGNOSIS — R413 Other amnesia: Secondary | ICD-10-CM

## 2013-11-28 DIAGNOSIS — IMO0002 Reserved for concepts with insufficient information to code with codable children: Secondary | ICD-10-CM

## 2013-11-28 DIAGNOSIS — D649 Anemia, unspecified: Secondary | ICD-10-CM

## 2013-11-28 DIAGNOSIS — K219 Gastro-esophageal reflux disease without esophagitis: Secondary | ICD-10-CM

## 2013-11-28 DIAGNOSIS — Z7901 Long term (current) use of anticoagulants: Secondary | ICD-10-CM

## 2013-11-28 DIAGNOSIS — F329 Major depressive disorder, single episode, unspecified: Secondary | ICD-10-CM

## 2013-11-28 DIAGNOSIS — K589 Irritable bowel syndrome without diarrhea: Secondary | ICD-10-CM

## 2013-11-28 NOTE — Progress Notes (Signed)
Patient ID: Erin Hansen, female   DOB: 10/04/1925, 80 y.o.   MRN: [id number]   Code Status: DNR  Allergies  Allergen Reactions  . Codeine Nausea Only    Chief Complaint  Patient presents with  . Medical Managment of Chronic Issues    HPI: Patient is a 80 y.o. female seen in the SNF at [facility] today for  evaluation of colostomy site discomfort, depression(no energy, feeling down, tearful), memory lapses, SLUMs 15/30,  and other chronic medical conditions Problem List Items Addressed This Visit   Anemia     Hgb 10.0 10/12/13,    Degeneration of intervertebral disc, site unspecified     Mild back pain, Tylenol prn is adequate.     GERD (gastroesophageal reflux disease)     Takes Omeprazole 20mg  daily. No c/o nausea today.      Irritable bowel syndrome     Colace prn and daily available to her.     Long term (current) use of anticoagulants     R+L PE and RLE DVT--Xarelto      Major depressive disorder, single episode, unspecified (Chronic)     C/o not feeling well-no energy, depressed(off Zoloft 07/2012), not sleep at night, trouble with memory(recently started on Namenda), c/o nausea since this am. Improved, continue Cymbalta 30mg  daily.    Memory loss     Staff reported the patient is more confused, room had clothing, newspapers, magazine allover, removed colostomy bag+wafer, stated she doesn't know how all this happened. Also she thinks her son caused her room to become disarray. LOts of cuing for ADLs. SLUMs 15/30 and UA C/S  09/29/13 showed no growth. Staff also reported the patient's behavior has worsened since off Zoloft 08/15/13. Tolerated Namenda. Update CBC, CMP, TSH 10/12/13 unremarkable except Hgb 10.0. Her mood is better controlled since Cymbalta 30mg  11/18/13        Unspecified essential hypertension     Controlled, takes Metoprolol 25mg  bid.       Ventral hernia - Primary     Reducible parastomal hernia-colostomy functioning well, abd non tender  11/24/13: R>B surgical repair. Small risk of needing an emmergency surgery. Continue to observe. abd binder may be helpful. Stool softener as needed.        Review of Systems:  Review of Systems  Constitutional: Positive for malaise/fatigue. Negative for fever, chills, weight loss and diaphoresis.  HENT: Positive for hearing loss. Negative for congestion, ear discharge and sore throat.   Eyes: Negative for blurred vision, pain, discharge and redness.  Respiratory: Positive for cough. Negative for sputum production, shortness of breath and wheezing.   Cardiovascular: Negative for chest pain, palpitations, orthopnea, claudication, leg swelling and PND.  Gastrointestinal: Positive for nausea. Negative for heartburn, vomiting, abdominal pain, diarrhea, constipation and blood in stool.       Colostomy  Genitourinary: Positive for frequency (incontinent of bladder). Negative for dysuria, urgency and hematuria.  Musculoskeletal: Positive for back pain. Negative for falls, joint pain, myalgias and neck pain.       Mid back on and off-positional especially when she is bed to reach over for things. Not re-produced upon today's examination.   Skin: Negative for itching and rash.  Neurological: Negative for dizziness, tingling, tremors, sensory change, speech change, focal weakness, seizures, loss of consciousness, weakness and headaches.  Endo/Heme/Allergies: Negative for environmental allergies and polydipsia. Does not bruise/bleed easily.  Psychiatric/Behavioral: Positive for depression and memory loss. Negative for hallucinations. The patient is nervous/anxious and has insomnia.  Past Medical History  Diagnosis Date  . DJD (degenerative joint disease)   . Compression fracture     t12  . IBS (irritable bowel syndrome)   . Vaginal prolapse   . TMJ (temporomandibular joint disorder)   . Anemia   . Hearing loss   . Blood in stool   . Anxiety   . Cancer     Colon  . Unspecified essential  hypertension 11/18/2012  . Coronary atherosclerosis of native coronary artery 10/25/2012  . Long term (current) use of anticoagulants 10/25/2012  . Other pulmonary embolism and infarction 10/15/2012  . Phlebitis and thrombophlebitis of other deep vessels of lower extremities 10/15/2012  . Malignant neoplasm of colon, unspecified site 09/27/2012  . 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 Vitamin D deficiency 09/27/2012  . Hypopotassemia 09/27/2012  . Major depressive disorder, single episode, unspecified 09/27/2012  . Blepharitis, unspecified 09/27/2012  . Diffuse cystic mastopathy 09/27/2012  . Degeneration of intervertebral disc, site unspecified 09/27/2012  . Muscle weakness (generalized) 09/27/2012  . Abnormal involuntary movements(781.0) 09/27/2012  . Edema 09/27/2012  . Unspecified urinary incontinence 09/27/2012  . Colostomy status 09/27/2012   Past Surgical History  Procedure Laterality Date  . Appendectomy    . Vaginal prolapse repair  2005 - approximate  . Cystectomy      back  . Nasal reconstruction with septal repair  1983  . Skin cancer excision    . Arthroscopy left knee  2007  . Colonoscopy    . Total abdominal hysterectomy  1978  . Hernia repair  1980 - approximate  . Eye surgery  [phone]    cataracts bilateral  . Back surgery      mid back  . Laparoscopic sigmoid colectomy  09/20/2012    Procedure: LAPAROSCOPIC SIGMOID COLECTOMY;  Surgeon: Rolm Bookbinder, MD;  Location: [facility];  Service: General;  Laterality: N/A;  Laparoscopic possible Open Sigmoid Colectomy,Colostomy  . Colostomy  09/20/2012    Procedure: COLOSTOMY;  Surgeon: Rolm Bookbinder, MD;  Location: [facility];  Service: General;  Laterality: N/A;   Social History:   reports that she has never smoked. She has never used smokeless tobacco. She reports that she does not drink alcohol or use illicit drugs.  Family History  Problem Relation Age of Onset  . Cancer Son     spine  .  Cancer Son     Medications: Patient's Medications  New Prescriptions   No medications on file  Previous Medications   ACETAMINOPHEN (TYLENOL) 500 MG TABLET    Take 1,000 mg by mouth every 4 (four) hours as needed for pain.   ALBUTEROL (PROVENTIL) (2.5 MG/3ML) 0.083% NEBULIZER SOLUTION    Take 2.5 mg by nebulization. Use one via nebulizer every 6 hours a needed for SOB or wheezing   BENZONATATE (TESSALON) 100 MG CAPSULE    Take by mouth 3 (three) times daily.   DOCUSATE SODIUM (COLACE) 100 MG CAPSULE    Take 400 mg by mouth at bedtime as needed. For constipation   DULOXETINE (CYMBALTA) 30 MG CAPSULE    Take 30 mg by mouth daily.   ENULOSE 10 GM/15ML SOLN    40 g daily.    ERYTHROMYCIN OPHTHALMIC OINTMENT    Place 1 application into both eyes at bedtime.   FEEDING SUPPLEMENT (ENSURE COMPLETE) LIQD    Take 237 mLs by mouth 2 (two) times daily between meals.   LORAZEPAM (ATIVAN) 0.5 MG TABLET    Take 0.5  mg by mouth every 6 (six) hours as needed for anxiety.   MEMANTINE (NAMENDA) 10 MG TABLET    Take 28 mg by mouth daily.   METOPROLOL TARTRATE (LOPRESSOR) 25 MG TABLET    Take 1 tablet (25 mg total) by mouth 2 (two) times daily.   OMEPRAZOLE (PRILOSEC) 20 MG CAPSULE    Take one daily   RIVAROXABAN (XARELTO) 20 MG TABS TABLET    Take 20 mg by mouth daily.  Modified Medications   No medications on file  Discontinued Medications   No medications on file     Physical Exam: Physical Exam  Constitutional: She is oriented to person, place, and time. She appears well-developed and well-nourished.  HENT:  Head: Normocephalic and atraumatic.  Eyes: Conjunctivae and EOM are normal. Pupils are equal, round, and reactive to light.  Neck: Normal range of motion. Neck supple. No JVD present. No tracheal deviation present.  Cardiovascular: Normal rate and regular rhythm.   No murmur heard. Pulmonary/Chest: Effort normal and breath sounds normal. She has no wheezes. She has no rales.  Abdominal:  Soft. Bowel sounds are normal. There is no tenderness.  Musculoskeletal: Normal range of motion. She exhibits no edema and no tenderness.  Mid back on and off-positional especially when she is bed to reach over for things. Not re-produced upon today's examination.    Lymphadenopathy:    She has no cervical adenopathy.  Neurological: She is alert and oriented to person, place, and time. She has normal reflexes. No cranial nerve deficit. She exhibits normal muscle tone. Coordination normal.  Skin: Skin is warm and dry. No rash noted.  Psychiatric: Her affect is inappropriate. Her speech is delayed. She is not agitated, not aggressive, not slowed, not withdrawn and not actively hallucinating. Thought content is not paranoid and not delusional. Cognition and memory are impaired. She expresses inappropriate judgment. She does not express impulsivity. She exhibits a depressed mood. She exhibits abnormal recent memory.  Pulled off colostomy bag and milking the stoma 09/21/13. Tearful.    (type .physexam) Filed Vitals:   11/28/13 1539  BP: 136/72  Pulse: 76  Temp: 98.7 F (37.1 C)  TempSrc: Tympanic  Resp: 20      Labs reviewed: Basic Metabolic Panel:  Recent Labs  12/05/12 1000 12/23/12 1138 02/03/13 1146 03/24/13 1304 04/10/13 0929 05/01/13 10/12/13  NA 144 141 142 142 144 140 141  K 3.9 4.4 4.1 4.4 3.5 4.0 4.0  CL 108* 107 109*  --   --   --   --   CO2 24 26 25 25 23   --   --   GLUCOSE 110* 109* 103* 120 116  --   --   BUN 18.4 17.9 19.7 23.2 17.0 19 18  CREATININE 0.8 0.9 0.9 0.9 0.9 0.8 0.7  CALCIUM 9.2 9.5 9.3 9.1 9.5  --   --   TSH  --   --   --   --   --   --  1.98   Liver Function Tests:  Recent Labs  02/03/13 1146 03/24/13 1304 04/10/13 0929 05/01/13 10/12/13  AST 12 12 12  10* 12*  ALT 11 12 11 9 9   ALKPHOS 80 68 68 62 52  BILITOT 0.57 0.52 0.75  --   --   PROT 6.6 6.3* 6.6  --   --   ALBUMIN 3.2* 3.2* 3.3*  --   --    CBC:  Recent Labs  03/24/13 1304  04/10/13 0929 05/01/13 06/05/13  1019 10/12/13  WBC 5.5 7.1 6.5 7.5 5.9  NEUTROABS 2.8 3.1  --  3.6  --   HGB 10.2* 10.6* 9.3* 11.2* 10.0*  HCT 30.4* 30.7* 28* 33.2* 30*  MCV 103.0* 102.7*  --  99.6  --   PLT 228 224 226 254 231   Assessment/Plan Ventral hernia Reducible parastomal hernia-colostomy functioning well, abd non tender 11/24/13: R>B surgical repair. Small risk of needing an emmergency surgery. Continue to observe. abd binder may be helpful. Stool softener as needed.   Unspecified essential hypertension Controlled, takes Metoprolol 25mg  bid.     Major depressive disorder, single episode, unspecified C/o not feeling well-no energy, depressed(off Zoloft 07/2012), not sleep at night, trouble with memory(recently started on Namenda), c/o nausea since this am. Improved, continue Cymbalta 30mg  daily.  Memory loss Staff reported the patient is more confused, room had clothing, newspapers, magazine allover, removed colostomy bag+wafer, stated she doesn't know how all this happened. Also she thinks her son caused her room to become disarray. LOts of cuing for ADLs. SLUMs 15/30 and UA C/S  09/29/13 showed no growth. Staff also reported the patient's behavior has worsened since off Zoloft 08/15/13. Tolerated Namenda. Update CBC, CMP, TSH 10/12/13 unremarkable except Hgb 10.0. Her mood is better controlled since Cymbalta 30mg  11/18/13      GERD (gastroesophageal reflux disease) Takes Omeprazole 20mg  daily. No c/o nausea today.    Long term (current) use of anticoagulants R+L PE and RLE DVT--Xarelto    Anemia Hgb 10.0 10/12/13,  Degeneration of intervertebral disc, site unspecified Mild back pain, Tylenol prn is adequate.   Irritable bowel syndrome Colace prn and daily available to her.     Family/ Staff Communication: observe the patient.   Goals of Care: SNF for higher level of care.   Labs/tests ordered: none

## 2013-12-03 NOTE — Assessment & Plan Note (Signed)
Controlled, takes Metoprolol 25mg bid.   

## 2013-12-03 NOTE — Assessment & Plan Note (Signed)
Colace prn and daily available to her.

## 2013-12-03 NOTE — Assessment & Plan Note (Signed)
Mild back pain, Tylenol prn is adequate.

## 2013-12-03 NOTE — Assessment & Plan Note (Signed)
C/o not feeling well-no energy, depressed(off Zoloft 07/2012), not sleep at night, trouble with memory(recently started on Namenda), c/o nausea since this am. Improved, continue Cymbalta 30mg  daily.

## 2013-12-03 NOTE — Assessment & Plan Note (Signed)
R+L PE and RLE DVT--Xarelto   

## 2013-12-03 NOTE — Assessment & Plan Note (Signed)
Takes Omeprazole 20mg daily. No c/o nausea today.    

## 2013-12-03 NOTE — Assessment & Plan Note (Signed)
Hgb 10.0 10/12/13,

## 2013-12-03 NOTE — Assessment & Plan Note (Signed)
Reducible parastomal hernia-colostomy functioning well, abd non tender 11/24/13: R>B surgical repair. Small risk of needing an emmergency surgery. Continue to observe. abd binder may be helpful. Stool softener as needed.

## 2013-12-03 NOTE — Assessment & Plan Note (Signed)
Staff reported the patient is more confused, room had clothing, newspapers, magazine allover, removed colostomy bag+wafer, stated she doesn't know how all this happened. Also she thinks her son caused her room to become disarray. LOts of cuing for ADLs. SLUMs 15/30 and UA C/S  09/29/13 showed no growth. Staff also reported the patient's behavior has worsened since off Zoloft 08/15/13. Tolerated Namenda. Update CBC, CMP, TSH 10/12/13 unremarkable except Hgb 10.0. Her mood is better controlled since Cymbalta 30mg  11/18/13

## 2013-12-19 ENCOUNTER — Encounter: Payer: Self-pay | Admitting: Internal Medicine

## 2013-12-29 ENCOUNTER — Non-Acute Institutional Stay (SKILLED_NURSING_FACILITY): Payer: Medicare Other | Admitting: Nurse Practitioner

## 2013-12-29 ENCOUNTER — Encounter: Payer: Self-pay | Admitting: Nurse Practitioner

## 2013-12-29 DIAGNOSIS — R110 Nausea: Secondary | ICD-10-CM

## 2013-12-29 DIAGNOSIS — M549 Dorsalgia, unspecified: Secondary | ICD-10-CM

## 2013-12-29 DIAGNOSIS — F329 Major depressive disorder, single episode, unspecified: Secondary | ICD-10-CM

## 2013-12-29 DIAGNOSIS — I10 Essential (primary) hypertension: Secondary | ICD-10-CM

## 2013-12-29 DIAGNOSIS — K219 Gastro-esophageal reflux disease without esophagitis: Secondary | ICD-10-CM

## 2013-12-29 DIAGNOSIS — Z7901 Long term (current) use of anticoagulants: Secondary | ICD-10-CM

## 2013-12-29 DIAGNOSIS — R413 Other amnesia: Secondary | ICD-10-CM

## 2013-12-29 DIAGNOSIS — K589 Irritable bowel syndrome without diarrhea: Secondary | ICD-10-CM

## 2013-12-29 NOTE — Progress Notes (Signed)
Patient ID: Erin Hansen, female   DOB: 1925/12/17, 80 y.o.   MRN: [id number]   Code Status: DNR  Allergies  Allergen Reactions  . Codeine Nausea Only    Chief Complaint  Patient presents with  . Medical Management of Chronic Issues    HPI: Patient is a 80 y.o. female seen in the SNF at [facility] today for  evaluation of chronic medical conditions Problem List Items Addressed This Visit   GERD (gastroesophageal reflux disease) - Primary     Takes Omeprazole 20mg  daily. No c/o nausea today.       Irritable bowel syndrome     Takes Enulose 35ml daily. Prn Colace available to her.     Long term (current) use of anticoagulants     R+L PE and RLE DVT--Xarelto     Major depressive disorder, single episode, unspecified (Chronic)     Much better since Cymbalta started. Smiling and no further complaining of aches/pains. Sleeps and rests better at night. Prn Lorazepam available to her.     Memory loss     Stabilized since Namenda and Cymbalta started.     Nausea alone     Resolved.     Pain of mid back     No problem. Prn Tylenol available to her.     Unspecified essential hypertension     Controlled, takes Metoprolol 25mg  bid.          Review of Systems:  Review of Systems  Constitutional: Negative for fever, chills, weight loss, malaise/fatigue and diaphoresis.  HENT: Positive for hearing loss. Negative for congestion, ear discharge and sore throat.   Eyes: Negative for blurred vision, pain, discharge and redness.  Respiratory: Negative for cough, sputum production, shortness of breath and wheezing.   Cardiovascular: Negative for chest pain, palpitations, orthopnea, claudication, leg swelling and PND.  Gastrointestinal: Negative for heartburn, nausea, vomiting, abdominal pain, diarrhea, constipation and blood in stool.       Colostomy  Genitourinary: Positive for frequency (incontinent of bladder). Negative for dysuria, urgency and hematuria.  Musculoskeletal:  Positive for back pain. Negative for falls, joint pain, myalgias and neck pain.       Mid back on and off-positional especially when she is bed to reach over for things. Not re-produced upon today's examination.   Skin: Negative for itching and rash.  Neurological: Negative for dizziness, tingling, tremors, sensory change, speech change, focal weakness, seizures, loss of consciousness, weakness and headaches.  Endo/Heme/Allergies: Negative for environmental allergies and polydipsia. Does not bruise/bleed easily.  Psychiatric/Behavioral: Positive for depression and memory loss. Negative for hallucinations. The patient is nervous/anxious and has insomnia.        Improved.      Past Medical History  Diagnosis Date  . DJD (degenerative joint disease)   . Compression fracture     t12  . IBS (irritable bowel syndrome)   . Vaginal prolapse   . TMJ (temporomandibular joint disorder)   . Anemia   . Hearing loss   . Blood in stool   . Anxiety   . Cancer     Colon  . Unspecified essential hypertension 11/18/2012  . Coronary atherosclerosis of native coronary artery 10/25/2012  . Long term (current) use of anticoagulants 10/25/2012  . Other pulmonary embolism and infarction 10/15/2012  . Phlebitis and thrombophlebitis of other deep vessels of lower extremities 10/15/2012  . Malignant neoplasm of colon, unspecified site 09/27/2012  . 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 Vitamin D deficiency 09/27/2012  . Hypopotassemia 09/27/2012  . Major depressive disorder, single episode, unspecified 09/27/2012  . Blepharitis, unspecified 09/27/2012  . Diffuse cystic mastopathy 09/27/2012  . Degeneration of intervertebral disc, site unspecified 09/27/2012  . Muscle weakness (generalized) 09/27/2012  . Abnormal involuntary movements(781.0) 09/27/2012  . Edema 09/27/2012  . Unspecified urinary incontinence 09/27/2012  . Colostomy status 09/27/2012   Past Surgical History  Procedure  Laterality Date  . Appendectomy    . Vaginal prolapse repair  2005 - approximate  . Cystectomy      back  . Nasal reconstruction with septal repair  1983  . Skin cancer excision    . Arthroscopy left knee  2007  . Colonoscopy    . Total abdominal hysterectomy  1978  . Hernia repair  1980 - approximate  . Eye surgery  [phone]    cataracts bilateral  . Back surgery      mid back  . Laparoscopic sigmoid colectomy  09/20/2012    Procedure: LAPAROSCOPIC SIGMOID COLECTOMY;  Surgeon: Rolm Bookbinder, MD;  Location: [facility];  Service: General;  Laterality: N/A;  Laparoscopic possible Open Sigmoid Colectomy,Colostomy  . Colostomy  09/20/2012    Procedure: COLOSTOMY;  Surgeon: Rolm Bookbinder, MD;  Location: [facility];  Service: General;  Laterality: N/A;   Social History:   reports that she has never smoked. She has never used smokeless tobacco. She reports that she does not drink alcohol or use illicit drugs.  Family History  Problem Relation Age of Onset  . Cancer Son     spine  . Cancer Son     Medications: Patient's Medications  New Prescriptions   No medications on file  Previous Medications   ACETAMINOPHEN (TYLENOL) 500 MG TABLET    Take 1,000 mg by mouth every 4 (four) hours as needed for pain.   ALBUTEROL (PROVENTIL) (2.5 MG/3ML) 0.083% NEBULIZER SOLUTION    Take 2.5 mg by nebulization. Use one via nebulizer every 6 hours a needed for SOB or wheezing   BENZONATATE (TESSALON) 100 MG CAPSULE    Take by mouth 3 (three) times daily.   DOCUSATE SODIUM (COLACE) 100 MG CAPSULE    Take 400 mg by mouth at bedtime as needed. For constipation   DULOXETINE (CYMBALTA) 30 MG CAPSULE    Take 30 mg by mouth daily.   ENULOSE 10 GM/15ML SOLN    40 g daily.    ERYTHROMYCIN OPHTHALMIC OINTMENT    Place 1 application into both eyes at bedtime.   FEEDING SUPPLEMENT (ENSURE COMPLETE) LIQD    Take 237 mLs by mouth 2 (two) times daily between meals.   LORAZEPAM (ATIVAN) 0.5 MG TABLET    Take 0.5 mg  by mouth every 6 (six) hours as needed for anxiety.   MEMANTINE (NAMENDA) 10 MG TABLET    Take 28 mg by mouth daily.   METOPROLOL TARTRATE (LOPRESSOR) 25 MG TABLET    Take 1 tablet (25 mg total) by mouth 2 (two) times daily.   OMEPRAZOLE (PRILOSEC) 20 MG CAPSULE    Take one daily   RIVAROXABAN (XARELTO) 20 MG TABS TABLET    Take 20 mg by mouth daily.  Modified Medications   No medications on file  Discontinued Medications   No medications on file     Physical Exam: Physical Exam  Constitutional: She is oriented to person, place, and time. She appears well-developed and well-nourished.  HENT:  Head: Normocephalic and atraumatic.  Eyes: Conjunctivae and EOM  are normal. Pupils are equal, round, and reactive to light.  Neck: Normal range of motion. Neck supple. No JVD present. No tracheal deviation present.  Cardiovascular: Normal rate and regular rhythm.   No murmur heard. Pulmonary/Chest: Effort normal and breath sounds normal. She has no wheezes. She has no rales.  Abdominal: Soft. Bowel sounds are normal. There is no tenderness.  Musculoskeletal: Normal range of motion. She exhibits no edema and no tenderness.     Lymphadenopathy:    She has no cervical adenopathy.  Neurological: She is alert and oriented to person, place, and time. She has normal reflexes. No cranial nerve deficit. She exhibits normal muscle tone. Coordination normal.  Skin: Skin is warm and dry. No rash noted.  Psychiatric: Her affect is inappropriate. Her speech is delayed. She is not agitated, not aggressive, not slowed, not withdrawn and not actively hallucinating. Thought content is not paranoid and not delusional. Cognition and memory are impaired. She expresses inappropriate judgment. She does not express impulsivity. She exhibits a depressed mood. She exhibits abnormal recent memory.  Pulled off colostomy bag and milking the stoma 09/21/13. Tearful. Smiling and pleasant today.    (type .physexam) Filed  Vitals:   12/29/13 1230  BP: 114/76  Pulse: 75  Temp: 97.6 F (36.4 C)  TempSrc: Tympanic  Resp: 18      Labs reviewed: Basic Metabolic Panel:  Recent Labs  02/03/13 1146 03/24/13 1304 04/10/13 0929 05/01/13 10/12/13 11/20/13  NA 142 142 144 140 141 141  K 4.1 4.4 3.5 4.0 4.0 3.8  CL 109*  --   --   --   --   --   CO2 25 25 23   --   --   --   GLUCOSE 103* 120 116  --   --   --   BUN 19.7 23.2 17.0 19 18 18   CREATININE 0.9 0.9 0.9 0.8 0.7 0.8  CALCIUM 9.3 9.1 9.5  --   --   --   TSH  --   --   --   --  1.98  --    Liver Function Tests:  Recent Labs  02/03/13 1146 03/24/13 1304 04/10/13 0929 05/01/13 10/12/13 11/20/13  AST 12 12 12  10* 12* 12*  ALT 11 12 11 9 9 11   ALKPHOS 80 68 68 62 52 60  BILITOT 0.57 0.52 0.75  --   --   --   PROT 6.6 6.3* 6.6  --   --   --   ALBUMIN 3.2* 3.2* 3.3*  --   --   --    CBC:  Recent Labs  03/24/13 1304 04/10/13 0929  06/05/13 1019 10/12/13 11/20/13  WBC 5.5 7.1  < > 7.5 5.9 6.6  NEUTROABS 2.8 3.1  --  3.6  --   --   HGB 10.2* 10.6*  < > 11.2* 10.0* 11.1*  HCT 30.4* 30.7*  < > 33.2* 30* 33*  MCV 103.0* 102.7*  --  99.6  --   --   PLT 228 224  < > 254 231 255  < > = values in this interval not displayed. Assessment/Plan GERD (gastroesophageal reflux disease) Takes Omeprazole 20mg  daily. No c/o nausea today.     Major depressive disorder, single episode, unspecified Much better since Cymbalta started. Smiling and no further complaining of aches/pains. Sleeps and rests better at night. Prn Lorazepam available to her.   Memory loss Stabilized since Namenda and Cymbalta started.   Nausea alone Resolved.  Unspecified essential hypertension Controlled, takes Metoprolol 25mg  bid.     Irritable bowel syndrome Takes Enulose 71ml daily. Prn Colace available to her.   Long term (current) use of anticoagulants R+L PE and RLE DVT--Xarelto   Pain of mid back No problem. Prn Tylenol available to her.     Family/  Staff Communication: observe the patient.   Goals of Care: SNF   Labs/tests ordered: none

## 2013-12-29 NOTE — Assessment & Plan Note (Signed)
Takes Omeprazole 20mg  daily. No c/o nausea today.

## 2013-12-29 NOTE — Assessment & Plan Note (Addendum)
Much better since Cymbalta started. Smiling and no further complaining of aches/pains. Sleeps and rests better at night. Prn Lorazepam available to her.

## 2013-12-29 NOTE — Assessment & Plan Note (Signed)
Controlled, takes Metoprolol 25mg bid.   

## 2013-12-29 NOTE — Assessment & Plan Note (Signed)
Resolved

## 2013-12-29 NOTE — Assessment & Plan Note (Signed)
R+L PE and RLE DVT--Xarelto   

## 2013-12-29 NOTE — Assessment & Plan Note (Signed)
No problem. Prn Tylenol available to her.

## 2013-12-29 NOTE — Assessment & Plan Note (Signed)
Stabilized since Namenda and Cymbalta started.   

## 2013-12-29 NOTE — Assessment & Plan Note (Addendum)
Takes Enulose 72ml daily. Prn Colace available to her.

## 2014-01-09 ENCOUNTER — Non-Acute Institutional Stay (SKILLED_NURSING_FACILITY): Payer: Medicare Other | Admitting: Nurse Practitioner

## 2014-01-09 ENCOUNTER — Encounter: Payer: Self-pay | Admitting: Nurse Practitioner

## 2014-01-09 DIAGNOSIS — M549 Dorsalgia, unspecified: Secondary | ICD-10-CM

## 2014-01-09 DIAGNOSIS — R413 Other amnesia: Secondary | ICD-10-CM

## 2014-01-09 DIAGNOSIS — F329 Major depressive disorder, single episode, unspecified: Secondary | ICD-10-CM

## 2014-01-09 DIAGNOSIS — K219 Gastro-esophageal reflux disease without esophagitis: Secondary | ICD-10-CM

## 2014-01-09 DIAGNOSIS — Z7901 Long term (current) use of anticoagulants: Secondary | ICD-10-CM

## 2014-01-09 NOTE — Assessment & Plan Note (Signed)
No problem. Prn Tylenol available to her.  

## 2014-01-09 NOTE — Assessment & Plan Note (Signed)
R+L PE and RLE DVT--Xarelto   

## 2014-01-09 NOTE — Assessment & Plan Note (Addendum)
uch better since Cymbalta started. Smiling and no further complaining of aches/pains. Sleeps and rests better at night. Prn Lorazepam available to her. Now staff reported the patient's depression has relapsed-doesn't feel like to eat or doing anything. Will update CBC, CMP, UA C/S. Increase Cymbalta to 60mg  daily for now. Observe the patient. Psych consult. Sponge bath unless the patient requests a shower-difficulty with self imaging since her colostomy. Admitted her depression and early am awake.

## 2014-01-09 NOTE — Assessment & Plan Note (Signed)
Takes Omeprazole 20mg  daily. Stable.

## 2014-01-09 NOTE — Assessment & Plan Note (Signed)
Stabilized since Namenda and Cymbalta started.

## 2014-01-09 NOTE — Progress Notes (Signed)
Patient ID: STAR RESLER, female   DOB: Sep 21, 1925, 80 y.o.   MRN: [id number]   Code Status: DNR  Allergies  Allergen Reactions  . Codeine Nausea Only    Chief Complaint  Patient presents with  . Medical Management of Chronic Issues  . Acute Visit    depression.     HPI: Patient is a 80 y.o. female seen in the SNF at [facility] today for  evaluation of depression and chronic medical conditions Problem List Items Addressed This Visit   Major depressive disorder, single episode, unspecified - Primary (Chronic)     uch better since Cymbalta started. Smiling and no further complaining of aches/pains. Sleeps and rests better at night. Prn Lorazepam available to her. Now staff reported the patient's depression has relapsed-doesn't feel like to eat or doing anything. Will update CBC, CMP, UA C/S. Increase Cymbalta to 60mg  daily for now. Observe the patient. Psych consult. Sponge bath unless the patient requests a shower-difficulty with self imaging since her colostomy. Admitted her depression and early am awake.      Relevant Medications      DULoxetine (CYMBALTA) 60 MG capsule   Long term (current) use of anticoagulants     R+L PE and RLE DVT--Xarelto      Memory loss     Stabilized since Namenda and Cymbalta started.      GERD (gastroesophageal reflux disease)     Takes Omeprazole 20mg  daily. Stable.     Pain of mid back     No problem. Prn Tylenol available to her.         Review of Systems:  Review of Systems  Constitutional: Negative for fever, chills, weight loss, malaise/fatigue and diaphoresis.  HENT: Positive for hearing loss. Negative for congestion, ear discharge and sore throat.   Eyes: Negative for blurred vision, pain, discharge and redness.  Respiratory: Negative for cough, sputum production, shortness of breath and wheezing.   Cardiovascular: Negative for chest pain, palpitations, orthopnea, claudication, leg swelling and PND.  Gastrointestinal:  Negative for heartburn, nausea, vomiting, abdominal pain, diarrhea, constipation and blood in stool.       Colostomy  Genitourinary: Positive for frequency (incontinent of bladder). Negative for dysuria, urgency and hematuria.  Musculoskeletal: Positive for back pain. Negative for falls, joint pain, myalgias and neck pain.       Mid back on and off-positional especially when she is bed to reach over for things. Not re-produced upon today's examination.   Skin: Negative for itching and rash.  Neurological: Negative for dizziness, tingling, tremors, sensory change, speech change, focal weakness, seizures, loss of consciousness, weakness and headaches.  Endo/Heme/Allergies: Negative for environmental allergies and polydipsia. Does not bruise/bleed easily.  Psychiatric/Behavioral: Positive for depression and memory loss. Negative for hallucinations. The patient is nervous/anxious and has insomnia.        Relapsed after initial improvement.      Past Medical History  Diagnosis Date  . DJD (degenerative joint disease)   . Compression fracture     t12  . IBS (irritable bowel syndrome)   . Vaginal prolapse   . TMJ (temporomandibular joint disorder)   . Anemia   . Hearing loss   . Blood in stool   . Anxiety   . Cancer     Colon  . Unspecified essential hypertension 11/18/2012  . Coronary atherosclerosis of native coronary artery 10/25/2012  . Long term (current) use of anticoagulants 10/25/2012  . Other pulmonary embolism and infarction 10/15/2012  .  Phlebitis and thrombophlebitis of other deep vessels of lower extremities 10/15/2012  . Malignant neoplasm of colon, unspecified site 09/27/2012  . 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 Vitamin D deficiency 09/27/2012  . Hypopotassemia 09/27/2012  . Major depressive disorder, single episode, unspecified 09/27/2012  . Blepharitis, unspecified 09/27/2012  . Diffuse cystic mastopathy 09/27/2012  . Degeneration of  intervertebral disc, site unspecified 09/27/2012  . Muscle weakness (generalized) 09/27/2012  . Abnormal involuntary movements(781.0) 09/27/2012  . Edema 09/27/2012  . Unspecified urinary incontinence 09/27/2012  . Colostomy status 09/27/2012   Past Surgical History  Procedure Laterality Date  . Appendectomy    . Vaginal prolapse repair  2005 - approximate  . Cystectomy      back  . Nasal reconstruction with septal repair  1983  . Skin cancer excision    . Arthroscopy left knee  2007  . Colonoscopy    . Total abdominal hysterectomy  1978  . Hernia repair  1980 - approximate  . Eye surgery  [phone]    cataracts bilateral  . Back surgery      mid back  . Laparoscopic sigmoid colectomy  09/20/2012    Procedure: LAPAROSCOPIC SIGMOID COLECTOMY;  Surgeon: Rolm Bookbinder, MD;  Location: [facility];  Service: General;  Laterality: N/A;  Laparoscopic possible Open Sigmoid Colectomy,Colostomy  . Colostomy  09/20/2012    Procedure: COLOSTOMY;  Surgeon: Rolm Bookbinder, MD;  Location: [facility];  Service: General;  Laterality: N/A;   Social History:   reports that she has never smoked. She has never used smokeless tobacco. She reports that she does not drink alcohol or use illicit drugs.  Family History  Problem Relation Age of Onset  . Cancer Son     spine  . Cancer Son     Medications: Patient's Medications  New Prescriptions   No medications on file  Previous Medications   ACETAMINOPHEN (TYLENOL) 500 MG TABLET    Take 1,000 mg by mouth every 4 (four) hours as needed for pain.   ALBUTEROL (PROVENTIL) (2.5 MG/3ML) 0.083% NEBULIZER SOLUTION    Take 2.5 mg by nebulization. Use one via nebulizer every 6 hours a needed for SOB or wheezing   BENZONATATE (TESSALON) 100 MG CAPSULE    Take by mouth 3 (three) times daily.   DOCUSATE SODIUM (COLACE) 100 MG CAPSULE    Take 400 mg by mouth at bedtime as needed. For constipation   DULOXETINE (CYMBALTA) 60 MG CAPSULE    Take 60 mg by mouth daily.    ENULOSE 10 GM/15ML SOLN    40 g daily.    ERYTHROMYCIN OPHTHALMIC OINTMENT    Place 1 application into both eyes at bedtime.   FEEDING SUPPLEMENT (ENSURE COMPLETE) LIQD    Take 237 mLs by mouth 2 (two) times daily between meals.   LORAZEPAM (ATIVAN) 0.5 MG TABLET    Take 0.5 mg by mouth every 6 (six) hours as needed for anxiety.   MEMANTINE (NAMENDA) 10 MG TABLET    Take 28 mg by mouth daily.   METOPROLOL TARTRATE (LOPRESSOR) 25 MG TABLET    Take 1 tablet (25 mg total) by mouth 2 (two) times daily.   OMEPRAZOLE (PRILOSEC) 20 MG CAPSULE    Take one daily   RIVAROXABAN (XARELTO) 20 MG TABS TABLET    Take 20 mg by mouth daily.  Modified Medications   No medications on file  Discontinued Medications   DULOXETINE (CYMBALTA) 30 MG CAPSULE  Take 30 mg by mouth daily.     Physical Exam: Physical Exam  Constitutional: She is oriented to person, place, and time. She appears well-developed and well-nourished.  HENT:  Head: Normocephalic and atraumatic.  Eyes: Conjunctivae and EOM are normal. Pupils are equal, round, and reactive to light.  Neck: Normal range of motion. Neck supple. No JVD present. No tracheal deviation present.  Cardiovascular: Normal rate and regular rhythm.   No murmur heard. Pulmonary/Chest: Effort normal and breath sounds normal. She has no wheezes. She has no rales.  Abdominal: Soft. Bowel sounds are normal. There is no tenderness.  Musculoskeletal: Normal range of motion. She exhibits no edema and no tenderness.     Lymphadenopathy:    She has no cervical adenopathy.  Neurological: She is alert and oriented to person, place, and time. She has normal reflexes. No cranial nerve deficit. She exhibits normal muscle tone. Coordination normal.  Skin: Skin is warm and dry. No rash noted.  Psychiatric: Her affect is inappropriate. Her speech is delayed. She is not agitated, not aggressive, not slowed, not withdrawn and not actively hallucinating. Thought content is not paranoid  and not delusional. Cognition and memory are impaired. She expresses inappropriate judgment. She does not express impulsivity. She exhibits a depressed mood. She exhibits abnormal recent memory.  Pulled off colostomy bag and milking the stoma 09/21/13. Doesn't feel like to eat or doing anything.    (type .physexam) Filed Vitals:   01/09/14 1046  BP: 120/68  Pulse: 76  Temp: 98.1 F (36.7 C)  TempSrc: Tympanic  Resp: 16      Labs reviewed: Basic Metabolic Panel:  Recent Labs  02/03/13 1146 03/24/13 1304 04/10/13 0929 05/01/13 10/12/13 11/20/13  NA 142 142 144 140 141 141  K 4.1 4.4 3.5 4.0 4.0 3.8  CL 109*  --   --   --   --   --   CO2 25 25 23   --   --   --   GLUCOSE 103* 120 116  --   --   --   BUN 19.7 23.2 17.0 19 18 18   CREATININE 0.9 0.9 0.9 0.8 0.7 0.8  CALCIUM 9.3 9.1 9.5  --   --   --   TSH  --   --   --   --  1.98  --    Liver Function Tests:  Recent Labs  02/03/13 1146 03/24/13 1304 04/10/13 0929 05/01/13 10/12/13 11/20/13  AST 12 12 12  10* 12* 12*  ALT 11 12 11 9 9 11   ALKPHOS 80 68 68 62 52 60  BILITOT 0.57 0.52 0.75  --   --   --   PROT 6.6 6.3* 6.6  --   --   --   ALBUMIN 3.2* 3.2* 3.3*  --   --   --    CBC:  Recent Labs  03/24/13 1304 04/10/13 0929  06/05/13 1019 10/12/13 11/20/13  WBC 5.5 7.1  < > 7.5 5.9 6.6  NEUTROABS 2.8 3.1  --  3.6  --   --   HGB 10.2* 10.6*  < > 11.2* 10.0* 11.1*  HCT 30.4* 30.7*  < > 33.2* 30* 33*  MCV 103.0* 102.7*  --  99.6  --   --   PLT 228 224  < > 254 231 255  < > = values in this interval not displayed.  Past Procedures:  11/03/13 X-ray T spine: marked vertebral body compression deformity at the approximate T12 level  with 90% loss of body height. The patient is status post vertebroplasty at T12.  Assessment/Plan Major depressive disorder, single episode, unspecified uch better since Cymbalta started. Smiling and no further complaining of aches/pains. Sleeps and rests better at night. Prn Lorazepam  available to her. Now staff reported the patient's depression has relapsed-doesn't feel like to eat or doing anything. Will update CBC, CMP, UA C/S. Increase Cymbalta to 60mg  daily for now. Observe the patient. Psych consult. Sponge bath unless the patient requests a shower-difficulty with self imaging since her colostomy. Admitted her depression and early am awake.    GERD (gastroesophageal reflux disease) Takes Omeprazole 20mg  daily. Stable.   Long term (current) use of anticoagulants R+L PE and RLE DVT--Xarelto    Pain of mid back No problem. Prn Tylenol available to her.    Memory loss Stabilized since Namenda and Cymbalta started.      Family/ Staff Communication: observe the patient.   Goals of Care: SNF   Labs/tests ordered: CBC, CMP, UA C/S

## 2014-01-11 LAB — HEPATIC FUNCTION PANEL
ALK PHOS: 64 U/L (ref 25–125)
ALT: 14 U/L (ref 7–35)
AST: 13 U/L (ref 13–35)
Bilirubin, Total: 0.3 mg/dL

## 2014-01-11 LAB — CBC AND DIFFERENTIAL
HEMATOCRIT: 31 % — AB (ref 36–46)
HEMOGLOBIN: 10.2 g/dL — AB (ref 12.0–16.0)
PLATELETS: 251 10*3/uL (ref 150–399)
WBC: 7 10^3/mL

## 2014-01-11 LAB — BASIC METABOLIC PANEL
BUN: 21 mg/dL (ref 4–21)
Creatinine: 0.8 mg/dL (ref 0.5–1.1)
Glucose: 101 mg/dL
Potassium: 3.9 mmol/L (ref 3.4–5.3)
Sodium: 140 mmol/L (ref 137–147)

## 2014-01-12 ENCOUNTER — Other Ambulatory Visit: Payer: Self-pay | Admitting: Nurse Practitioner

## 2014-01-16 ENCOUNTER — Other Ambulatory Visit: Payer: Self-pay | Admitting: Nurse Practitioner

## 2014-01-19 ENCOUNTER — Encounter: Payer: Self-pay | Admitting: *Deleted

## 2014-02-06 ENCOUNTER — Encounter: Payer: Self-pay | Admitting: Nurse Practitioner

## 2014-02-06 ENCOUNTER — Non-Acute Institutional Stay (SKILLED_NURSING_FACILITY): Payer: Medicare Other | Admitting: Nurse Practitioner

## 2014-02-06 DIAGNOSIS — R059 Cough, unspecified: Secondary | ICD-10-CM

## 2014-02-06 DIAGNOSIS — I10 Essential (primary) hypertension: Secondary | ICD-10-CM

## 2014-02-06 DIAGNOSIS — I2699 Other pulmonary embolism without acute cor pulmonale: Secondary | ICD-10-CM

## 2014-02-06 DIAGNOSIS — R413 Other amnesia: Secondary | ICD-10-CM

## 2014-02-06 DIAGNOSIS — R5381 Other malaise: Secondary | ICD-10-CM

## 2014-02-06 DIAGNOSIS — F329 Major depressive disorder, single episode, unspecified: Secondary | ICD-10-CM

## 2014-02-06 DIAGNOSIS — R5383 Other fatigue: Principal | ICD-10-CM

## 2014-02-06 DIAGNOSIS — K219 Gastro-esophageal reflux disease without esophagitis: Secondary | ICD-10-CM

## 2014-02-06 DIAGNOSIS — R05 Cough: Secondary | ICD-10-CM

## 2014-02-06 NOTE — Assessment & Plan Note (Signed)
Controlled, takes Metoprolol 25mg bid.   

## 2014-02-06 NOTE — Assessment & Plan Note (Signed)
Stable, off PPI  

## 2014-02-06 NOTE — Progress Notes (Signed)
Patient ID: Erin Hansen, female   DOB: May 27, 1926, 80 y.o.   MRN: [id number]   Code Status: DNR  Allergies  Allergen Reactions  . Codeine Nausea Only    Chief Complaint  Patient presents with  . Medical Management of Chronic Issues  . Acute Visit    coughs, malaise, confusion.     HPI: Patient is a 80 y.o. female seen in the SNF at [facility] today for  evaluation of cough, malaise, confusion, and chronic medical conditions Problem List Items Addressed This Visit   Bilateral pulmonary embolism     Chronic anticoagulation with Coumadin.     Cough     Non productive for 2-3 days. Afebrile and no O2 desaturation. Will obtain CXR. Symptomatic management with Mucinex 600mg  bid x 5days.     GERD (gastroesophageal reflux disease)     Stable, off PPI    Major depressive disorder, single episode, unspecified (Chronic)     much better since Cymbalta increased and Namenda started. Prn Lorazepam 0.5mg  q6hr available to her.       Memory loss     takes Namenda and Cymbalta. More confusion 2-3 days. No focal neurological deficits. Will obtain CXR, UA, CBC, and BMP    Other malaise and fatigue - Primary     Associated with cough and increased confusion. Will obtain CBC, BMP, UA, and CXR.     Unspecified essential hypertension     Controlled, takes Metoprolol 25mg  bid.          Review of Systems:  Review of Systems  Constitutional: Positive for malaise/fatigue. Negative for fever, chills, weight loss and diaphoresis.  HENT: Positive for hearing loss. Negative for congestion, ear discharge and sore throat.   Eyes: Negative for blurred vision, pain, discharge and redness.  Respiratory: Positive for cough. Negative for sputum production, shortness of breath and wheezing.   Cardiovascular: Negative for chest pain, palpitations, orthopnea, claudication, leg swelling and PND.  Gastrointestinal: Negative for heartburn, nausea, vomiting, abdominal pain, diarrhea, constipation and  blood in stool.       Colostomy  Genitourinary: Positive for frequency (incontinent of bladder). Negative for dysuria, urgency and hematuria.  Musculoskeletal: Positive for back pain. Negative for falls, joint pain, myalgias and neck pain.       Mid back on and off-positional especially when she is bed to reach over for things. Not re-produced upon today's examination.   Skin: Negative for itching and rash.  Neurological: Negative for dizziness, tingling, tremors, sensory change, speech change, focal weakness, seizures, loss of consciousness, weakness and headaches.  Endo/Heme/Allergies: Negative for environmental allergies and polydipsia. Does not bruise/bleed easily.  Psychiatric/Behavioral: Positive for depression and memory loss. Negative for hallucinations. The patient is nervous/anxious and has insomnia.        Increased confusion. Better mood overall.      Past Medical History  Diagnosis Date  . DJD (degenerative joint disease)   . Compression fracture     t12  . IBS (irritable bowel syndrome)   . Vaginal prolapse   . TMJ (temporomandibular joint disorder)   . Anemia   . Hearing loss   . Blood in stool   . Anxiety   . Cancer     Colon  . Unspecified essential hypertension 11/18/2012  . Coronary atherosclerosis of native coronary artery 10/25/2012  . Long term (current) use of anticoagulants 10/25/2012  . Other pulmonary embolism and infarction 10/15/2012  . Phlebitis and thrombophlebitis of other deep vessels of lower  extremities 10/15/2012  . Malignant neoplasm of colon, unspecified site 09/27/2012  . 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 Vitamin D deficiency 09/27/2012  . Hypopotassemia 09/27/2012  . Major depressive disorder, single episode, unspecified 09/27/2012  . Blepharitis, unspecified 09/27/2012  . Diffuse cystic mastopathy 09/27/2012  . Degeneration of intervertebral disc, site unspecified 09/27/2012  . Muscle weakness (generalized)  09/27/2012  . Abnormal involuntary movements(781.0) 09/27/2012  . Edema 09/27/2012  . Unspecified urinary incontinence 09/27/2012  . Colostomy status 09/27/2012   Past Surgical History  Procedure Laterality Date  . Appendectomy    . Vaginal prolapse repair  2005 - approximate  . Cystectomy      back  . Nasal reconstruction with septal repair  1983  . Skin cancer excision    . Arthroscopy left knee  2007  . Colonoscopy    . Total abdominal hysterectomy  1978  . Hernia repair  1980 - approximate  . Eye surgery  [phone]    cataracts bilateral  . Back surgery      mid back  . Laparoscopic sigmoid colectomy  09/20/2012    Procedure: LAPAROSCOPIC SIGMOID COLECTOMY;  Surgeon: Rolm Bookbinder, MD;  Location: [facility];  Service: General;  Laterality: N/A;  Laparoscopic possible Open Sigmoid Colectomy,Colostomy  . Colostomy  09/20/2012    Procedure: COLOSTOMY;  Surgeon: Rolm Bookbinder, MD;  Location: [facility];  Service: General;  Laterality: N/A;   Social History:   reports that she has never smoked. She has never used smokeless tobacco. She reports that she does not drink alcohol or use illicit drugs.  Family History  Problem Relation Age of Onset  . Cancer Son     spine  . Cancer Son     Medications: Patient's Medications  New Prescriptions   No medications on file  Previous Medications   ACETAMINOPHEN (TYLENOL) 500 MG TABLET    Take 1,000 mg by mouth every 4 (four) hours as needed for pain.   ALBUTEROL (PROVENTIL) (2.5 MG/3ML) 0.083% NEBULIZER SOLUTION    Take 2.5 mg by nebulization. Use one via nebulizer every 6 hours a needed for SOB or wheezing   BENZONATATE (TESSALON) 100 MG CAPSULE    Tale 1 capsule by mouth twice daily as needed for cough   DULOXETINE (CYMBALTA) 60 MG CAPSULE    Take 60 mg by mouth daily.   ENULOSE 10 GM/15ML SOLN    40 g daily.    FEEDING SUPPLEMENT (ENSURE COMPLETE) LIQD    Take 237 mLs by mouth 2 (two) times daily between meals.   LORAZEPAM (ATIVAN) 0.5  MG TABLET    Take 0.5 mg by mouth every 6 (six) hours as needed for anxiety.   MEMANTINE (NAMENDA) 10 MG TABLET    Take 28 mg by mouth daily.   METOPROLOL TARTRATE (LOPRESSOR) 25 MG TABLET    Take 1 tablet (25 mg total) by mouth 2 (two) times daily.   OMEPRAZOLE (PRILOSEC) 20 MG CAPSULE    Take one daily   RIVAROXABAN (XARELTO) 20 MG TABS TABLET    Take 20 mg by mouth daily.  Modified Medications   No medications on file  Discontinued Medications   No medications on file     Physical Exam: Physical Exam  Constitutional: She is oriented to person, place, and time. She appears well-developed and well-nourished.  HENT:  Head: Normocephalic and atraumatic.  Eyes: Conjunctivae and EOM are normal. Pupils are equal, round, and reactive to light.  Neck:  Normal range of motion. Neck supple. No JVD present. No tracheal deviation present.  Cardiovascular: Normal rate and regular rhythm.   No murmur heard. Pulmonary/Chest: Effort normal and breath sounds normal. She has no wheezes. She has no rales.  Abdominal: Soft. Bowel sounds are normal. There is no tenderness.  Musculoskeletal: Normal range of motion. She exhibits no edema and no tenderness.     Lymphadenopathy:    She has no cervical adenopathy.  Neurological: She is alert and oriented to person, place, and time. She has normal reflexes. No cranial nerve deficit. She exhibits normal muscle tone. Coordination normal.  Skin: Skin is warm and dry. No rash noted.  Psychiatric: Her affect is inappropriate. Her speech is delayed. She is not agitated, not aggressive, not slowed, not withdrawn and not actively hallucinating. Thought content is not paranoid and not delusional. Cognition and memory are impaired. She expresses inappropriate judgment. She does not express impulsivity. She exhibits a depressed mood. She exhibits abnormal recent memory.  Pulled off colostomy bag and milking the stoma 09/21/13. Doesn't feel like to eat or doing anything.      (type .physexam) Filed Vitals:   02/06/14 1437  BP: 144/64  Pulse: 70  Temp: 99.2 F (37.3 C)  TempSrc: Tympanic  Resp: 20      Labs reviewed: Basic Metabolic Panel:  Recent Labs  03/24/13 1304 04/10/13 0929  05/01/13 10/12/13 11/20/13 01/11/14  NA 142 144  < > 140 141 141 140  K 4.4 3.5  < > 4.0 4.0 3.8 3.9  CO2 25 23  --   --   --   --   --   GLUCOSE 120 116  --   --   --   --   --   BUN 23.2 17.0  < > 19 18 18 21   CREATININE 0.9 0.9  < > 0.8 0.7 0.8 0.8  CALCIUM 9.1 9.5  --   --   --   --   --   TSH  --   --   --   --  1.98  --   --   < > = values in this interval not displayed. Liver Function Tests:  Recent Labs  03/24/13 1304 04/10/13 0929  10/12/13 11/20/13 01/11/14  AST 12 12  < > 12* 12* 13  ALT 12 11  < > 9 11 14   ALKPHOS 68 68  < > 52 60 64  BILITOT 0.52 0.75  --   --   --   --   PROT 6.3* 6.6  --   --   --   --   ALBUMIN 3.2* 3.3*  --   --   --   --   < > = values in this interval not displayed. CBC:  Recent Labs  03/24/13 1304 04/10/13 0929  06/05/13 1019 10/12/13 11/20/13 01/11/14  WBC 5.5 7.1  < > 7.5 5.9 6.6 7.0  NEUTROABS 2.8 3.1  --  3.6  --   --   --   HGB 10.2* 10.6*  < > 11.2* 10.0* 11.1* 10.2*  HCT 30.4* 30.7*  < > 33.2* 30* 33* 31*  MCV 103.0* 102.7*  --  99.6  --   --   --   PLT 228 224  < > 254 231 255 251  < > = values in this interval not displayed.  Past Procedures:  11/03/13 X-ray T spine: marked vertebral body compression deformity at the approximate T12 level with 90%  loss of body height. The patient is status post vertebroplasty at T12.  Assessment/Plan Other malaise and fatigue Associated with cough and increased confusion. Will obtain CBC, BMP, UA, and CXR.   Memory loss takes Namenda and Cymbalta. More confusion 2-3 days. No focal neurological deficits. Will obtain CXR, UA, CBC, and BMP  Cough Non productive for 2-3 days. Afebrile and no O2 desaturation. Will obtain CXR. Symptomatic management with Mucinex 600mg   bid x 5days.   Major depressive disorder, single episode, unspecified much better since Cymbalta increased and Namenda started. Prn Lorazepam 0.5mg  q6hr available to her.     Bilateral pulmonary embolism Chronic anticoagulation with Coumadin.   GERD (gastroesophageal reflux disease) Stable, off PPI  Unspecified essential hypertension Controlled, takes Metoprolol 25mg  bid.       Family/ Staff Communication: observe the patient.   Goals of Care: SNF   Labs/tests ordered: CBC, CMP, UA C/S, CXR

## 2014-02-06 NOTE — Assessment & Plan Note (Signed)
Chronic anticoagulation with Coumadin 

## 2014-02-06 NOTE — Assessment & Plan Note (Signed)
takes Namenda and Cymbalta. More confusion 2-3 days. No focal neurological deficits. Will obtain CXR, UA, CBC, and BMP

## 2014-02-06 NOTE — Assessment & Plan Note (Signed)
Non productive for 2-3 days. Afebrile and no O2 desaturation. Will obtain CXR. Symptomatic management with Mucinex 600mg  bid x 5days.

## 2014-02-06 NOTE — Assessment & Plan Note (Signed)
Associated with cough and increased confusion. Will obtain CBC, BMP, UA, and CXR.

## 2014-02-06 NOTE — Assessment & Plan Note (Signed)
much better since Cymbalta increased and Namenda started. Prn Lorazepam 0.5mg  q6hr available to her.

## 2014-02-07 LAB — BASIC METABOLIC PANEL
BUN: 22 mg/dL — AB (ref 4–21)
CREATININE: 0.8 mg/dL (ref 0.5–1.1)
GLUCOSE: 105 mg/dL
POTASSIUM: 3.4 mmol/L (ref 3.4–5.3)
Sodium: 141 mmol/L (ref 137–147)

## 2014-02-09 ENCOUNTER — Other Ambulatory Visit: Payer: Self-pay | Admitting: Nurse Practitioner

## 2014-02-09 ENCOUNTER — Encounter: Payer: Self-pay | Admitting: Nurse Practitioner

## 2014-02-09 DIAGNOSIS — E876 Hypokalemia: Secondary | ICD-10-CM | POA: Insufficient documentation

## 2014-02-09 MED ORDER — POTASSIUM CHLORIDE CRYS ER 20 MEQ PO TBCR: 10.0000 meq | EXTENDED_RELEASE_TABLET | Freq: Every day | ORAL | Status: DC

## 2014-02-15 LAB — BASIC METABOLIC PANEL
BUN: 14 mg/dL (ref 4–21)
CREATININE: 0.7 mg/dL (ref 0.5–1.1)
Glucose: 93 mg/dL
Potassium: 4.2 mmol/L (ref 3.4–5.3)
Sodium: 140 mmol/L (ref 137–147)

## 2014-02-16 ENCOUNTER — Other Ambulatory Visit: Payer: Self-pay | Admitting: Nurse Practitioner

## 2014-02-20 ENCOUNTER — Other Ambulatory Visit: Payer: Self-pay | Admitting: Nurse Practitioner

## 2014-02-20 MED ORDER — DOCUSATE SODIUM 100 MG PO CAPS: 100.0000 mg | ORAL_CAPSULE | Freq: Two times a day (BID) | ORAL | Status: DC

## 2014-02-27 ENCOUNTER — Non-Acute Institutional Stay (SKILLED_NURSING_FACILITY): Payer: Medicare Other | Admitting: Nurse Practitioner

## 2014-02-27 ENCOUNTER — Encounter: Payer: Self-pay | Admitting: Nurse Practitioner

## 2014-02-27 DIAGNOSIS — K5900 Constipation, unspecified: Secondary | ICD-10-CM | POA: Insufficient documentation

## 2014-02-27 DIAGNOSIS — Y92129 Unspecified place in nursing home as the place of occurrence of the external cause: Secondary | ICD-10-CM

## 2014-02-27 DIAGNOSIS — K219 Gastro-esophageal reflux disease without esophagitis: Secondary | ICD-10-CM

## 2014-02-27 DIAGNOSIS — Z7901 Long term (current) use of anticoagulants: Secondary | ICD-10-CM

## 2014-02-27 DIAGNOSIS — W19XXXA Unspecified fall, initial encounter: Secondary | ICD-10-CM | POA: Insufficient documentation

## 2014-02-27 DIAGNOSIS — E876 Hypokalemia: Secondary | ICD-10-CM

## 2014-02-27 DIAGNOSIS — W19XXXS Unspecified fall, sequela: Secondary | ICD-10-CM

## 2014-02-27 DIAGNOSIS — R05 Cough: Secondary | ICD-10-CM

## 2014-02-27 DIAGNOSIS — T7589XS Other specified effects of external causes, sequela: Secondary | ICD-10-CM

## 2014-02-27 DIAGNOSIS — I10 Essential (primary) hypertension: Secondary | ICD-10-CM

## 2014-02-27 DIAGNOSIS — R413 Other amnesia: Secondary | ICD-10-CM

## 2014-02-27 DIAGNOSIS — R059 Cough, unspecified: Secondary | ICD-10-CM

## 2014-02-27 NOTE — Assessment & Plan Note (Signed)
Managed. Continue Colace 100mg  bid.

## 2014-02-27 NOTE — Assessment & Plan Note (Signed)
Well supplemented. Serum K 4.2. Continue Kcl 90meq daily.

## 2014-02-27 NOTE — Assessment & Plan Note (Signed)
takes Namenda 28nmg and Cymbalta 60mg . No focal neurological deficits.

## 2014-02-27 NOTE — Progress Notes (Signed)
This encounter was created in error - please disregard.

## 2014-02-27 NOTE — Assessment & Plan Note (Signed)
Stable, off PPI

## 2014-02-27 NOTE — Assessment & Plan Note (Signed)
Resolved

## 2014-02-27 NOTE — Assessment & Plan Note (Signed)
The patient slid down to the floor beside her bed. No apparent injury.

## 2014-02-27 NOTE — Assessment & Plan Note (Signed)
R+L PE and RLE DVT--Xarelto

## 2014-02-27 NOTE — Assessment & Plan Note (Signed)
Controlled, takes Metoprolol 25mg bid.   

## 2014-02-27 NOTE — Progress Notes (Signed)
Patient ID: Erin Hansen, female   DOB: 10-14-1925, 80 y.o.   MRN: [id number]   Code Status: DNR  Allergies  Allergen Reactions  . Codeine Nausea Only    Chief Complaint  Patient presents with  . Medical Management of Chronic Issues  . Acute Visit    s/p fall    HPI: Patient is a 80 y.o. female seen in the SNF at [facility] today for  evaluation of s/p fall and chronic medical conditions Problem List Items Addressed This Visit   Unspecified essential hypertension     Controlled, takes Metoprolol 25mg  bid.       Unspecified constipation     Managed. Continue Colace 100mg  bid.     Memory loss     takes Namenda 28nmg and Cymbalta 60mg . No focal neurological deficits.     Long term (current) use of anticoagulants     R+L PE and RLE DVT--Xarelto      Hypokalemia - Primary     Well supplemented. Serum K 4.2. Continue Kcl 84meq daily.     GERD (gastroesophageal reflux disease)     Stable, off PPI     Fall at nursing home     The patient slid down to the floor beside her bed. No apparent injury.     Cough     Resolved.        Review of Systems:  Review of Systems  Constitutional: Positive for malaise/fatigue. Negative for fever, chills, weight loss and diaphoresis.  HENT: Positive for hearing loss. Negative for congestion, ear discharge and sore throat.   Eyes: Negative for blurred vision, pain, discharge and redness.  Respiratory: Positive for cough. Negative for sputum production, shortness of breath and wheezing.   Cardiovascular: Negative for chest pain, palpitations, orthopnea, claudication, leg swelling and PND.  Gastrointestinal: Negative for heartburn, nausea, vomiting, abdominal pain, diarrhea, constipation and blood in stool.       Colostomy  Genitourinary: Positive for frequency (incontinent of bladder). Negative for dysuria, urgency and hematuria.  Musculoskeletal: Positive for back pain and falls. Negative for joint pain, myalgias and neck pain.        Mid back on and off-positional especially when she is bed to reach over for things. Not re-produced upon today's examination.   Skin: Negative for itching and rash.  Neurological: Negative for dizziness, tingling, tremors, sensory change, speech change, focal weakness, seizures, loss of consciousness, weakness and headaches.  Endo/Heme/Allergies: Negative for environmental allergies and polydipsia. Does not bruise/bleed easily.  Psychiatric/Behavioral: Positive for depression and memory loss. Negative for hallucinations. The patient is nervous/anxious and has insomnia.        Increased confusion. Better mood overall.      Past Medical History  Diagnosis Date  . DJD (degenerative joint disease)   . Compression fracture     t12  . IBS (irritable bowel syndrome)   . Vaginal prolapse   . TMJ (temporomandibular joint disorder)   . Anemia   . Hearing loss   . Blood in stool   . Anxiety   . Cancer     Colon  . Unspecified essential hypertension 11/18/2012  . Coronary atherosclerosis of native coronary artery 10/25/2012  . Long term (current) use of anticoagulants 10/25/2012  . Other pulmonary embolism and infarction 10/15/2012  . Phlebitis and thrombophlebitis of other deep vessels of lower extremities 10/15/2012  . Malignant neoplasm of colon, unspecified site 09/27/2012  . 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  Vitamin D deficiency 09/27/2012  . Hypopotassemia 09/27/2012  . Major depressive disorder, single episode, unspecified 09/27/2012  . Blepharitis, unspecified 09/27/2012  . Diffuse cystic mastopathy 09/27/2012  . Degeneration of intervertebral disc, site unspecified 09/27/2012  . Muscle weakness (generalized) 09/27/2012  . Abnormal involuntary movements(781.0) 09/27/2012  . Edema 09/27/2012  . Unspecified urinary incontinence 09/27/2012  . Colostomy status 09/27/2012   Past Surgical History  Procedure Laterality Date  . Appendectomy    . Vaginal  prolapse repair  2005 - approximate  . Cystectomy      back  . Nasal reconstruction with septal repair  1983  . Skin cancer excision    . Arthroscopy left knee  2007  . Colonoscopy    . Total abdominal hysterectomy  1978  . Hernia repair  1980 - approximate  . Eye surgery  [phone]    cataracts bilateral  . Back surgery      mid back  . Laparoscopic sigmoid colectomy  09/20/2012    Procedure: LAPAROSCOPIC SIGMOID COLECTOMY;  Surgeon: Rolm Bookbinder, MD;  Location: [facility];  Service: General;  Laterality: N/A;  Laparoscopic possible Open Sigmoid Colectomy,Colostomy  . Colostomy  09/20/2012    Procedure: COLOSTOMY;  Surgeon: Rolm Bookbinder, MD;  Location: [facility];  Service: General;  Laterality: N/A;   Social History:   reports that she has never smoked. She has never used smokeless tobacco. She reports that she does not drink alcohol or use illicit drugs.  Family History  Problem Relation Age of Onset  . Cancer Son     spine  . Cancer Son     Medications: Patient's Medications  New Prescriptions   No medications on file  Previous Medications   ACETAMINOPHEN (TYLENOL) 500 MG TABLET    Take 1,000 mg by mouth every 4 (four) hours as needed for pain.   ALBUTEROL (PROVENTIL) (2.5 MG/3ML) 0.083% NEBULIZER SOLUTION    Take 2.5 mg by nebulization. Use one via nebulizer every 6 hours a needed for SOB or wheezing   BENZONATATE (TESSALON) 100 MG CAPSULE    Tale 1 capsule by mouth twice daily as needed for cough   DOCUSATE SODIUM (COLACE) 100 MG CAPSULE    Take 1 capsule (100 mg total) by mouth 2 (two) times daily.   DULOXETINE (CYMBALTA) 60 MG CAPSULE    Take 60 mg by mouth daily.   ENULOSE 10 GM/15ML SOLN    40 g daily.    FEEDING SUPPLEMENT (ENSURE COMPLETE) LIQD    Take 237 mLs by mouth 2 (two) times daily between meals.   LORAZEPAM (ATIVAN) 0.5 MG TABLET    Take 0.5 mg by mouth every 6 (six) hours as needed for anxiety.   MEMANTINE (NAMENDA) 10 MG TABLET    Take 28 mg by mouth  daily.   METOPROLOL TARTRATE (LOPRESSOR) 25 MG TABLET    Take 1 tablet (25 mg total) by mouth 2 (two) times daily.   OMEPRAZOLE (PRILOSEC) 20 MG CAPSULE    Take one daily   POTASSIUM CHLORIDE SA (K-DUR,KLOR-CON) 20 MEQ TABLET    Take 0.5 tablets (10 mEq total) by mouth daily.   RIVAROXABAN (XARELTO) 20 MG TABS TABLET    Take 20 mg by mouth daily.  Modified Medications   No medications on file  Discontinued Medications   No medications on file     Physical Exam: Physical Exam  Constitutional: She is oriented to person, place, and time. She appears well-developed and well-nourished.  HENT:  Head: Normocephalic  and atraumatic.  Eyes: Conjunctivae and EOM are normal. Pupils are equal, round, and reactive to light.  Neck: Normal range of motion. Neck supple. No JVD present. No tracheal deviation present.  Cardiovascular: Normal rate and regular rhythm.   No murmur heard. Pulmonary/Chest: Effort normal and breath sounds normal. She has no wheezes. She has no rales.  Abdominal: Soft. Bowel sounds are normal. There is no tenderness.  Musculoskeletal: Normal range of motion. She exhibits no edema and no tenderness.  Ambulates with walker.   Lymphadenopathy:    She has no cervical adenopathy.  Neurological: She is alert and oriented to person, place, and time. She has normal reflexes. No cranial nerve deficit. She exhibits normal muscle tone. Coordination normal.  Skin: Skin is warm and dry. No rash noted.  Psychiatric: Her affect is inappropriate. Her speech is delayed. She is not agitated, not aggressive, not slowed, not withdrawn and not actively hallucinating. Thought content is not paranoid and not delusional. Cognition and memory are impaired. She expresses inappropriate judgment. She does not express impulsivity. She exhibits a depressed mood. She exhibits abnormal recent memory.  Pulled off colostomy bag and milking the stoma 09/21/13. Doesn't feel like to eat or doing anything.    (type  .physexam) Filed Vitals:   02/27/14 1100  BP: 158/66  Pulse: 70  Temp: 97.9 F (36.6 C)  TempSrc: Tympanic  Resp: 18      Labs reviewed: Basic Metabolic Panel:  Recent Labs  03/24/13 1304 04/10/13 0929  05/01/13 10/12/13  01/11/14 02/07/14 02/15/14  NA 142 144  < > 140 141  < > 140 141 140  K 4.4 3.5  < > 4.0 4.0  < > 3.9 3.4 4.2  CO2 25 23  --   --   --   --   --   --   --   GLUCOSE 120 116  --   --   --   --   --   --   --   BUN 23.2 17.0  < > 19 18  < > 21 22* 14  CREATININE 0.9 0.9  < > 0.8 0.7  < > 0.8 0.8 0.7  CALCIUM 9.1 9.5  --   --   --   --   --   --   --   TSH  --   --   --   --  1.98  --   --   --   --   < > = values in this interval not displayed. Liver Function Tests:  Recent Labs  03/24/13 1304 04/10/13 0929  10/12/13 11/20/13 01/11/14  AST 12 12  < > 12* 12* 13  ALT 12 11  < > 9 11 14   ALKPHOS 68 68  < > 52 60 64  BILITOT 0.52 0.75  --   --   --   --   PROT 6.3* 6.6  --   --   --   --   ALBUMIN 3.2* 3.3*  --   --   --   --   < > = values in this interval not displayed. CBC:  Recent Labs  03/24/13 1304 04/10/13 0929  06/05/13 1019 10/12/13 11/20/13 01/11/14  WBC 5.5 7.1  < > 7.5 5.9 6.6 7.0  NEUTROABS 2.8 3.1  --  3.6  --   --   --   HGB 10.2* 10.6*  < > 11.2* 10.0* 11.1* 10.2*  HCT 30.4* 30.7*  < >  33.2* 30* 33* 31*  MCV 103.0* 102.7*  --  99.6  --   --   --   PLT 228 224  < > 254 231 255 251  < > = values in this interval not displayed.  Past Procedures:  11/03/13 X-ray T spine: marked vertebral body compression deformity at the approximate T12 level with 90% loss of body height. The patient is status post vertebroplasty at T12.   02/06/14 CXR no active cardiopulmonary disease.  Assessment/Plan Hypokalemia Well supplemented. Serum K 4.2. Continue Kcl 64meq daily.   Unspecified constipation Managed. Continue Colace 100mg  bid.   GERD (gastroesophageal reflux disease) Stable, off PPI   Memory loss takes Namenda 28nmg and Cymbalta  60mg . No focal neurological deficits.   Long term (current) use of anticoagulants R+L PE and RLE DVT--Xarelto    Cough Resolved.   Unspecified essential hypertension Controlled, takes Metoprolol 25mg  bid.     Fall at nursing home The patient slid down to the floor beside her bed. No apparent injury.     Family/ Staff Communication: observe the patient.   Goals of Care: SNF   Labs/tests ordered: none

## 2014-03-16 ENCOUNTER — Other Ambulatory Visit: Payer: Medicare Other

## 2014-03-16 ENCOUNTER — Encounter: Payer: Self-pay | Admitting: Nurse Practitioner

## 2014-03-16 ENCOUNTER — Non-Acute Institutional Stay (SKILLED_NURSING_FACILITY): Payer: Medicare Other | Admitting: Nurse Practitioner

## 2014-03-16 ENCOUNTER — Ambulatory Visit (HOSPITAL_BASED_OUTPATIENT_CLINIC_OR_DEPARTMENT_OTHER): Payer: Medicare Other | Admitting: Oncology

## 2014-03-16 VITALS — BP 132/66 | HR 65 | Temp 98.5°F | Resp 18 | Ht 67.0 in | Wt 148.4 lb

## 2014-03-16 DIAGNOSIS — T7589XS Other specified effects of external causes, sequela: Secondary | ICD-10-CM

## 2014-03-16 DIAGNOSIS — I10 Essential (primary) hypertension: Secondary | ICD-10-CM

## 2014-03-16 DIAGNOSIS — E876 Hypokalemia: Secondary | ICD-10-CM

## 2014-03-16 DIAGNOSIS — C189 Malignant neoplasm of colon, unspecified: Secondary | ICD-10-CM

## 2014-03-16 DIAGNOSIS — K219 Gastro-esophageal reflux disease without esophagitis: Secondary | ICD-10-CM

## 2014-03-16 DIAGNOSIS — W19XXXS Unspecified fall, sequela: Secondary | ICD-10-CM

## 2014-03-16 DIAGNOSIS — Z86711 Personal history of pulmonary embolism: Secondary | ICD-10-CM

## 2014-03-16 DIAGNOSIS — R413 Other amnesia: Secondary | ICD-10-CM

## 2014-03-16 DIAGNOSIS — Z86718 Personal history of other venous thrombosis and embolism: Secondary | ICD-10-CM

## 2014-03-16 DIAGNOSIS — C187 Malignant neoplasm of sigmoid colon: Secondary | ICD-10-CM

## 2014-03-16 DIAGNOSIS — Y92129 Unspecified place in nursing home as the place of occurrence of the external cause: Secondary | ICD-10-CM

## 2014-03-16 DIAGNOSIS — K5900 Constipation, unspecified: Secondary | ICD-10-CM

## 2014-03-16 DIAGNOSIS — F333 Major depressive disorder, recurrent, severe with psychotic symptoms: Secondary | ICD-10-CM

## 2014-03-16 NOTE — Assessment & Plan Note (Signed)
takes Namenda 28nmg and Cymbalta 60mg . No focal neurological deficits. Memory deficit and confusion.

## 2014-03-16 NOTE — Assessment & Plan Note (Signed)
Frequent falling due to her lack of safety awareness related to her dementia and her increased frailty and possible AR psychotropic meds. Intensive supervision needed.

## 2014-03-16 NOTE — Assessment & Plan Note (Addendum)
Stable, takes Omeprazole 20mg 

## 2014-03-16 NOTE — Assessment & Plan Note (Signed)
Low blood pressure measurements noted. Will Bp bid for 1 wek-then re evaluate, decreased  Metoprolol 12.5mg  bid.

## 2014-03-16 NOTE — Assessment & Plan Note (Signed)
much better since Cymbalta increased and Namenda started and Abiliy 5mg  since 02/27/14. Her auditory hallucination has improved. Abilify started when she heard her father's voice. Now she stated that she misses him so much that sometimes she mistook somebody else voice as her father's. Prn Lorazepam 0.5mg  q6hr available to her. Pending Psych consult 04/06/14

## 2014-03-16 NOTE — Assessment & Plan Note (Signed)
Managed. Continue Colace 100mg  bid.

## 2014-03-16 NOTE — Progress Notes (Addendum)
  [facility] OFFICE PROGRESS NOTE   Diagnosis:  Colon cancer.  INTERVAL HISTORY:   Ms. Mccardle returns as scheduled. She is accompanied by her son. He reports she has been moved from assisted-living to skilled nursing. She continues to have memory problems. She reports the colostomy is functioning well. She denies any bloody or black stools. No abdominal pain. She has a good appetite. She denies shortness of breath. Her main complaint is being "tired".  Objective:  Vital signs in last 24 hours:  Blood pressure 132/66, pulse 65, temperature 98.5 F (36.9 C), temperature source Oral, resp. rate 18, height 5\' 7"  (1.702 m), weight 148 lb 6.4 oz (67.314 kg).    HEENT: No thrush or ulcerations. Lymphatics: No palpable cervical, supraclavicular, axillary or inguinal lymph nodes. Resp: Lungs clear. Cardio: Regular cardiac rhythm. GI: Abdomen soft and nontender. No hepatomegaly. Left lower quadrant colostomy. Soft, reducible parastomal hernia. Vascular: No leg edema.   Lab Results:  Lab Results  Component Value Date   WBC 7.0 01/11/2014   HGB 10.2* 01/11/2014   HCT 31* 01/11/2014   MCV 99.6 06/05/2013   PLT 251 01/11/2014   NEUTROABS 3.6 06/05/2013    Imaging:  No results found.  Medications: I have reviewed the patient's current medications.  Assessment/Plan: 1. 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 She began cycle 1 adjuvant Xeloda 11/17/2012. She completed the final cycle of adjuvant Xeloda beginning 04/13/2013. 2. Normal preoperative CEA 09/15/2012 (2.8). CEA normal at 0.6 on 06/05/2013. 3. Hospitalization 10/09/2012 through  10/14/2012 with bilateral pulmonary emboli, right lower extremity DVT. Previously on Coumadin. Anticoagulation changed to Lovenox prior to beginning to Xeloda. Now maintained on Xarelto.  4. History of diarrhea/rectal bleeding secondary to the sigmoid colon tumor. 5. Status post liver biopsy 09/14/2012 with pathology showing benign liver, negative for malignancy. 6. Liver hematoma on CT 10/08/2012 felt to likely be related to the liver biopsy. 7. Progressive short-term memory deficit. Unlikely this is related to colon cancer. Recommend continued followup with Dr. Nyoka Cowden.   Disposition: Ms. Onofre remains in clinical remission from colon cancer. We will followup on the CEA from today.   She is greater than one year out from the DVT/PE that occurred following the colon surgery. She is currently maintained on Xarelto. We will defer the decision regarding continuation of anticoagulation to Dr. Nyoka Cowden.  She will return for a followup visit and CEA in 6 months.   Patient seen with Dr. Benay Spice.  Ned Card ANP/GNP-BC   03/16/2014  4:36 PM   This was a shared visit with Ned Card. Ms. Brunson was interviewed and examined.  We will ask Dr. Nyoka Cowden to decide on continuing anticoagulation therapy.  Julieanne Manson, M.D.

## 2014-03-16 NOTE — Assessment & Plan Note (Signed)
Well supplemented. Serum K 4.2. Continue Kcl 94meq daily. Update BMP.

## 2014-03-16 NOTE — Progress Notes (Signed)
Patient ID: Erin Hansen, female   DOB: [birth year], 80 y.o.   MRN: [id number]   Code Status: DNR  Allergies  Allergen Reactions  . Codeine Nausea Only    Chief Complaint  Patient presents with  . Medical Management of Chronic Issues  . Acute Visit    depression with psychotic features, low Bps    HPI: Patient is a 80 y.o. female seen in the SNF at [facility] today for  evaluation of s/p depression with psychotic features, low blood pressure, and chronic medical conditions Problem List Items Addressed This Visit   Severe major depression with psychotic features - Primary     much better since Cymbalta increased and Namenda started and Abiliy 5mg  since 02/27/14. Her auditory hallucination has improved. Abilify started when she heard her father's voice. Now she stated that she misses him so much that sometimes she mistook somebody else voice as her father's. Prn Lorazepam 0.5mg  q6hr available to her. Pending Psych consult 04/06/14       Unspecified essential hypertension     Low blood pressure measurements noted. Will Bp bid for 1 wek-then re evaluate, decreased  Metoprolol 12.5mg  bid.      Memory loss     takes Namenda 28nmg and Cymbalta 60mg . No focal neurological deficits. Memory deficit and confusion.       GERD (gastroesophageal reflux disease)     Stable, takes Omeprazole 20mg      Hypokalemia     Well supplemented. Serum K 4.2. Continue Kcl 75meq daily. Update BMP.      Unspecified constipation     Managed. Continue Colace 100mg  bid.      Fall at nursing home     Frequent falling due to her lack of safety awareness related to her dementia and her increased frailty and possible AR psychotropic meds. Intensive supervision needed.        Review of Systems:  Review of Systems  Constitutional: Positive for malaise/fatigue. Negative for fever, chills, weight loss and diaphoresis.  HENT: Positive for hearing loss. Negative for congestion, ear discharge and sore  throat.   Eyes: Negative for blurred vision, pain, discharge and redness.  Respiratory: Positive for cough. Negative for sputum production, shortness of breath and wheezing.   Cardiovascular: Negative for chest pain, palpitations, orthopnea, claudication, leg swelling and PND.  Gastrointestinal: Negative for heartburn, nausea, vomiting, abdominal pain, diarrhea, constipation and blood in stool.       Colostomy  Genitourinary: Positive for frequency (incontinent of bladder). Negative for dysuria, urgency and hematuria.  Musculoskeletal: Positive for back pain and falls. Negative for joint pain, myalgias and neck pain.       Mid back on and off-positional especially when she is bed to reach over for things. Not re-produced upon today's examination.   Skin: Negative for itching and rash.  Neurological: Negative for dizziness, tingling, tremors, sensory change, speech change, focal weakness, seizures, loss of consciousness, weakness and headaches.  Endo/Heme/Allergies: Negative for environmental allergies and polydipsia. Does not bruise/bleed easily.  Psychiatric/Behavioral: Positive for depression and memory loss. Negative for hallucinations. The patient is nervous/anxious and has insomnia.        Increased confusion. Better mood overall.      Past Medical History  Diagnosis Date  . DJD (degenerative joint disease)   . Compression fracture     t12  . IBS (irritable bowel syndrome)   . Vaginal prolapse   . TMJ (temporomandibular joint disorder)   . Anemia   .  Hearing loss   . Blood in stool   . Anxiety   . Cancer     Colon  . Unspecified essential hypertension 11/18/2012  . Coronary atherosclerosis of native coronary artery 10/25/2012  . Long term (current) use of anticoagulants 10/25/2012  . Other pulmonary embolism and infarction 10/15/2012  . Phlebitis and thrombophlebitis of other deep vessels of lower extremities 10/15/2012  . Malignant neoplasm of colon, unspecified site 09/27/2012  .  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 Vitamin D deficiency 09/27/2012  . Hypopotassemia 09/27/2012  . Major depressive disorder, single episode, unspecified 09/27/2012  . Blepharitis, unspecified 09/27/2012  . Diffuse cystic mastopathy 09/27/2012  . Degeneration of intervertebral disc, site unspecified 09/27/2012  . Muscle weakness (generalized) 09/27/2012  . Abnormal involuntary movements(781.0) 09/27/2012  . Edema 09/27/2012  . Unspecified urinary incontinence 09/27/2012  . Colostomy status 09/27/2012   Past Surgical History  Procedure Laterality Date  . Appendectomy    . Vaginal prolapse repair  2005 - approximate  . Cystectomy      back  . Nasal reconstruction with septal repair  1983  . Skin cancer excision    . Arthroscopy left knee  2007  . Colonoscopy    . Total abdominal hysterectomy  1978  . Hernia repair  1980 - approximate  . Eye surgery  [phone]    cataracts bilateral  . Back surgery      mid back  . Laparoscopic sigmoid colectomy  09/20/2012    Procedure: LAPAROSCOPIC SIGMOID COLECTOMY;  Surgeon: Rolm Bookbinder, MD;  Location: [facility];  Service: General;  Laterality: N/A;  Laparoscopic possible Open Sigmoid Colectomy,Colostomy  . Colostomy  09/20/2012    Procedure: COLOSTOMY;  Surgeon: Rolm Bookbinder, MD;  Location: [facility];  Service: General;  Laterality: N/A;   Social History:   reports that she has never smoked. She has never used smokeless tobacco. She reports that she does not drink alcohol or use illicit drugs.  Family History  Problem Relation Age of Onset  . Cancer Son     spine  . Cancer Son     Medications: Patient's Medications  New Prescriptions   No medications on file  Previous Medications   ACETAMINOPHEN (TYLENOL) 500 MG TABLET    Take 1,000 mg by mouth every 4 (four) hours as needed for pain.   ALBUTEROL (PROVENTIL) (2.5 MG/3ML) 0.083% NEBULIZER SOLUTION    Take 2.5 mg by nebulization. Use one via nebulizer  every 6 hours a needed for SOB or wheezing   ARIPIPRAZOLE (ABILIFY) 5 MG TABLET    Take 5 mg by mouth daily.   BENZONATATE (TESSALON) 100 MG CAPSULE    Tale 1 capsule by mouth twice daily as needed for cough   DOCUSATE SODIUM (COLACE) 100 MG CAPSULE    Take 1 capsule (100 mg total) by mouth 2 (two) times daily.   DULOXETINE (CYMBALTA) 60 MG CAPSULE    Take 60 mg by mouth daily.   ENULOSE 10 GM/15ML SOLN    40 g daily.    FEEDING SUPPLEMENT (ENSURE COMPLETE) LIQD    Take 237 mLs by mouth 2 (two) times daily between meals.   LORAZEPAM (ATIVAN) 0.5 MG TABLET    Take 0.5 mg by mouth every 6 (six) hours as needed for anxiety.   MEMANTINE (NAMENDA) 10 MG TABLET    Take 28 mg by mouth daily.   METOPROLOL TARTRATE (LOPRESSOR) 25 MG TABLET    Take 1 tablet (25  mg total) by mouth 2 (two) times daily.   OMEPRAZOLE (PRILOSEC) 20 MG CAPSULE    Take one daily   POTASSIUM CHLORIDE SA (K-DUR,KLOR-CON) 20 MEQ TABLET    Take 0.5 tablets (10 mEq total) by mouth daily.   RIVAROXABAN (XARELTO) 20 MG TABS TABLET    Take 20 mg by mouth daily.  Modified Medications   No medications on file  Discontinued Medications   No medications on file     Physical Exam: Physical Exam  Constitutional: She is oriented to person, place, and time. She appears well-developed and well-nourished.  HENT:  Head: Normocephalic and atraumatic.  Eyes: Conjunctivae and EOM are normal. Pupils are equal, round, and reactive to light.  Neck: Normal range of motion. Neck supple. No JVD present. No tracheal deviation present.  Cardiovascular: Normal rate and regular rhythm.   No murmur heard. Pulmonary/Chest: Effort normal and breath sounds normal. She has no wheezes. She has no rales.  Abdominal: Soft. Bowel sounds are normal. There is no tenderness.  Musculoskeletal: Normal range of motion. She exhibits no edema and no tenderness.  Ambulates with walker.   Lymphadenopathy:    She has no cervical adenopathy.  Neurological: She is  alert and oriented to person, place, and time. She has normal reflexes. No cranial nerve deficit. She exhibits normal muscle tone. Coordination normal.  Skin: Skin is warm and dry. No rash noted.  Psychiatric: Her affect is inappropriate. Her speech is delayed. She is not agitated, not aggressive, not slowed, not withdrawn and not actively hallucinating. Thought content is not paranoid and not delusional. Cognition and memory are impaired. She expresses inappropriate judgment. She does not express impulsivity. She exhibits a depressed mood. She exhibits abnormal recent memory.  Pulled off colostomy bag and milking the stoma 09/21/13. Doesn't feel like to eat or doing anything.    (type .physexam) Filed Vitals:   03/16/14 1043  BP: 90/50  Pulse: 67  Temp: 97.8 F (36.6 C)  TempSrc: Tympanic  Resp: 16      Labs reviewed: Basic Metabolic Panel:  Recent Labs  03/24/13 1304 04/10/13 0929  05/01/13 10/12/13  01/11/14 02/07/14 02/15/14  NA 142 144  < > 140 141  < > 140 141 140  K 4.4 3.5  < > 4.0 4.0  < > 3.9 3.4 4.2  CO2 25 23  --   --   --   --   --   --   --   GLUCOSE 120 116  --   --   --   --   --   --   --   BUN 23.2 17.0  < > 19 18  < > 21 22* 14  CREATININE 0.9 0.9  < > 0.8 0.7  < > 0.8 0.8 0.7  CALCIUM 9.1 9.5  --   --   --   --   --   --   --   TSH  --   --   --   --  1.98  --   --   --   --   < > = values in this interval not displayed. Liver Function Tests:  Recent Labs  03/24/13 1304 04/10/13 0929  10/12/13 11/20/13 01/11/14  AST 12 12  < > 12* 12* 13  ALT 12 11  < > 9 11 14   ALKPHOS 68 68  < > 52 60 64  BILITOT 0.52 0.75  --   --   --   --  PROT 6.3* 6.6  --   --   --   --   ALBUMIN 3.2* 3.3*  --   --   --   --   < > = values in this interval not displayed. CBC:  Recent Labs  03/24/13 1304 04/10/13 0929  06/05/13 1019 10/12/13 11/20/13 01/11/14  WBC 5.5 7.1  < > 7.5 5.9 6.6 7.0  NEUTROABS 2.8 3.1  --  3.6  --   --   --   HGB 10.2* 10.6*  < > 11.2* 10.0*  11.1* 10.2*  HCT 30.4* 30.7*  < > 33.2* 30* 33* 31*  MCV 103.0* 102.7*  --  99.6  --   --   --   PLT 228 224  < > 254 231 255 251  < > = values in this interval not displayed.  Past Procedures:  11/03/13 X-ray T spine: marked vertebral body compression deformity at the approximate T12 level with 90% loss of body height. The patient is status post vertebroplasty at T12.   02/06/14 CXR no active cardiopulmonary disease.  Assessment/Plan Severe major depression with psychotic features much better since Cymbalta increased and Namenda started and Abiliy 5mg  since 02/27/14. Her auditory hallucination has improved. Abilify started when she heard her father's voice. Now she stated that she misses him so much that sometimes she mistook somebody else voice as her father's. Prn Lorazepam 0.5mg  q6hr available to her. Pending Psych consult 04/06/14     Unspecified essential hypertension Low blood pressure measurements noted. Will Bp bid for 1 wek-then re evaluate, decreased  Metoprolol 12.5mg  bid.    GERD (gastroesophageal reflux disease) Stable, takes Omeprazole 20mg    Hypokalemia Well supplemented. Serum K 4.2. Continue Kcl 69meq daily. Update BMP.    Unspecified constipation Managed. Continue Colace 100mg  bid.    Memory loss takes Namenda 28nmg and Cymbalta 60mg . No focal neurological deficits. Memory deficit and confusion.     Fall at nursing home Frequent falling due to her lack of safety awareness related to her dementia and her increased frailty and possible AR psychotropic meds. Intensive supervision needed.     Family/ Staff Communication: observe the patient.   Goals of Care: SNF   Labs/tests ordered: BMP

## 2014-03-17 LAB — CEA: CEA: 1.6 ng/mL (ref 0.0–5.0)

## 2014-03-19 LAB — BASIC METABOLIC PANEL
BUN: 14 mg/dL (ref 4–21)
Creatinine: 0.6 mg/dL (ref 0.5–1.1)
GLUCOSE: 95 mg/dL
POTASSIUM: 4.1 mmol/L (ref 3.4–5.3)
Sodium: 141 mmol/L (ref 137–147)

## 2014-03-20 ENCOUNTER — Telehealth: Payer: Self-pay | Admitting: *Deleted

## 2014-03-20 ENCOUNTER — Other Ambulatory Visit: Payer: Self-pay | Admitting: Nurse Practitioner

## 2014-03-20 DIAGNOSIS — Z7901 Long term (current) use of anticoagulants: Secondary | ICD-10-CM

## 2014-03-20 NOTE — Telephone Encounter (Signed)
Message copied by Brien Few on Tue Mar 20, 2014  9:59 AM ------      Message from: Betsy Coder B      Created: Mon Mar 19, 2014  6:18 PM       Please call son, CEA is normal, f/u as scheduled ------

## 2014-03-20 NOTE — Telephone Encounter (Signed)
Called pt's son with CEA results: Normal, per Dr. Benay Spice.

## 2014-03-30 ENCOUNTER — Non-Acute Institutional Stay (SKILLED_NURSING_FACILITY): Payer: Medicare Other | Admitting: Nurse Practitioner

## 2014-03-30 ENCOUNTER — Encounter: Payer: Self-pay | Admitting: Nurse Practitioner

## 2014-03-30 DIAGNOSIS — I10 Essential (primary) hypertension: Secondary | ICD-10-CM

## 2014-03-30 DIAGNOSIS — K219 Gastro-esophageal reflux disease without esophagitis: Secondary | ICD-10-CM

## 2014-03-30 DIAGNOSIS — K5900 Constipation, unspecified: Secondary | ICD-10-CM

## 2014-03-30 DIAGNOSIS — Y92129 Unspecified place in nursing home as the place of occurrence of the external cause: Secondary | ICD-10-CM

## 2014-03-30 DIAGNOSIS — Z5189 Encounter for other specified aftercare: Secondary | ICD-10-CM

## 2014-03-30 DIAGNOSIS — E876 Hypokalemia: Secondary | ICD-10-CM

## 2014-03-30 DIAGNOSIS — Z7901 Long term (current) use of anticoagulants: Secondary | ICD-10-CM

## 2014-03-30 DIAGNOSIS — W19XXXA Unspecified fall, initial encounter: Secondary | ICD-10-CM

## 2014-03-30 DIAGNOSIS — W19XXXD Unspecified fall, subsequent encounter: Secondary | ICD-10-CM

## 2014-03-30 DIAGNOSIS — R413 Other amnesia: Secondary | ICD-10-CM

## 2014-03-30 DIAGNOSIS — F333 Major depressive disorder, recurrent, severe with psychotic symptoms: Secondary | ICD-10-CM

## 2014-03-30 NOTE — Progress Notes (Signed)
Patient ID: Erin Hansen, female   DOB: 04-21-1926, 80 y.o.   MRN: [id number]   Code Status: DNR  Allergies  Allergen Reactions  . Codeine Nausea Only    Chief Complaint  Patient presents with  . Medical Management of Chronic Issues  . Acute Visit    fall    HPI: Patient is a 80 y.o. female seen in the SNF at [facility] today for  evaluation of s/p fall 03/29/14, depression with psychotic features, low blood pressure, and chronic medical conditions Problem List Items Addressed This Visit   Severe major depression with psychotic features     much better since Cymbalta increased and Namenda started and Abiliy 5mg  since 02/27/14. Her auditory hallucination has improved. Abilify started when she heard her father's voice. Now she stated that she misses him so much that sometimes she mistook somebody else voice as her father's. Prn Lorazepam 0.5mg  q6hr available to her. Pending Psych consult 04/06/14       Long term (current) use of anticoagulants     R+L PE and RLE DVT--Xarelto. May consider dc Xarelto if frequent falling continues.       Unspecified essential hypertension     Controlled, continue Metoprolol 12.5mg  bid.       Memory loss     takes Namenda 28nmg and Cymbalta 60mg . No focal neurological deficits. Memory deficit and confusion. Easily redirected from her looking for her deceased mom, dad, or husband behaviors.        GERD (gastroesophageal reflux disease)     Stable, takes Omeprazole 20mg       Hypokalemia     Well supplemented. Serum K 4.2. Continue Kcl 8meq daily.      Unspecified constipation     Managed. Continue Colace 100mg  bid.       Fall at nursing home - Primary     Lack of safety awareness and increased frailty. Intensive supervision needed.        Review of Systems:  Review of Systems  Constitutional: Positive for malaise/fatigue. Negative for fever, chills, weight loss and diaphoresis.  HENT: Positive for hearing loss. Negative for  congestion, ear discharge and sore throat.   Eyes: Negative for blurred vision, pain, discharge and redness.  Respiratory: Positive for cough. Negative for sputum production, shortness of breath and wheezing.   Cardiovascular: Negative for chest pain, palpitations, orthopnea, claudication, leg swelling and PND.  Gastrointestinal: Negative for heartburn, nausea, vomiting, abdominal pain, diarrhea, constipation and blood in stool.       Colostomy  Genitourinary: Positive for frequency (incontinent of bladder). Negative for dysuria, urgency and hematuria.  Musculoskeletal: Positive for back pain and falls. Negative for joint pain, myalgias and neck pain.       Mid back on and off-no c/o today [place] 03/29/14 w/o apparent injury.   Skin: Negative for itching and rash.  Neurological: Negative for dizziness, tingling, tremors, sensory change, speech change, focal weakness, seizures, loss of consciousness, weakness and headaches.  Endo/Heme/Allergies: Negative for environmental allergies and polydipsia. Does not bruise/bleed easily.  Psychiatric/Behavioral: Positive for depression and memory loss. Negative for hallucinations. The patient is nervous/anxious and has insomnia.        Increased confusion. Better mood overall.      Past Medical History  Diagnosis Date  . DJD (degenerative joint disease)   . Compression fracture     t12  . IBS (irritable bowel syndrome)   . Vaginal prolapse   . TMJ (temporomandibular joint disorder)   .  Anemia   . Hearing loss   . Blood in stool   . Anxiety   . Cancer     Colon  . Unspecified essential hypertension 11/18/2012  . Coronary atherosclerosis of native coronary artery 10/25/2012  . Long term (current) use of anticoagulants 10/25/2012  . Other pulmonary embolism and infarction 10/15/2012  . Phlebitis and thrombophlebitis of other deep vessels of lower extremities 10/15/2012  . Malignant neoplasm of colon, unspecified site 09/27/2012  . 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 Vitamin D deficiency 09/27/2012  . Hypopotassemia 09/27/2012  . Major depressive disorder, single episode, unspecified 09/27/2012  . Blepharitis, unspecified 09/27/2012  . Diffuse cystic mastopathy 09/27/2012  . Degeneration of intervertebral disc, site unspecified 09/27/2012  . Muscle weakness (generalized) 09/27/2012  . Abnormal involuntary movements(781.0) 09/27/2012  . Edema 09/27/2012  . Unspecified urinary incontinence 09/27/2012  . Colostomy status 09/27/2012   Past Surgical History  Procedure Laterality Date  . Appendectomy    . Vaginal prolapse repair  2005 - approximate  . Cystectomy      back  . Nasal reconstruction with septal repair  1983  . Skin cancer excision    . Arthroscopy left knee  2007  . Colonoscopy    . Total abdominal hysterectomy  1978  . Hernia repair  1980 - approximate  . Eye surgery  [phone]    cataracts bilateral  . Back surgery      mid back  . Laparoscopic sigmoid colectomy  09/20/2012    Procedure: LAPAROSCOPIC SIGMOID COLECTOMY;  Surgeon: Rolm Bookbinder, MD;  Location: [facility];  Service: General;  Laterality: N/A;  Laparoscopic possible Open Sigmoid Colectomy,Colostomy  . Colostomy  09/20/2012    Procedure: COLOSTOMY;  Surgeon: Rolm Bookbinder, MD;  Location: [facility];  Service: General;  Laterality: N/A;   Social History:   reports that she has never smoked. She has never used smokeless tobacco. She reports that she does not drink alcohol or use illicit drugs.  Family History  Problem Relation Age of Onset  . Cancer Son     spine  . Cancer Son     Medications: Patient's Medications  New Prescriptions   No medications on file  Previous Medications   ACETAMINOPHEN (TYLENOL) 500 MG TABLET    Take 1,000 mg by mouth every 4 (four) hours as needed for pain.   ALBUTEROL (PROVENTIL) (2.5 MG/3ML) 0.083% NEBULIZER SOLUTION    Take 2.5 mg by nebulization. Use one via nebulizer every 6 hours a needed  for SOB or wheezing   ARIPIPRAZOLE (ABILIFY) 5 MG TABLET    Take 5 mg by mouth daily.   BENZONATATE (TESSALON) 100 MG CAPSULE    Tale 1 capsule by mouth twice daily as needed for cough   DOCUSATE SODIUM (COLACE) 100 MG CAPSULE    Take 1 capsule (100 mg total) by mouth 2 (two) times daily.   DULOXETINE (CYMBALTA) 60 MG CAPSULE    Take 60 mg by mouth daily.   ENULOSE 10 GM/15ML SOLN    40 g daily.    FEEDING SUPPLEMENT (ENSURE COMPLETE) LIQD    Take 237 mLs by mouth 2 (two) times daily between meals.   LORAZEPAM (ATIVAN) 0.5 MG TABLET    Take 0.5 mg by mouth every 6 (six) hours as needed for anxiety.   MEMANTINE (NAMENDA) 10 MG TABLET    Take 28 mg by mouth daily.   METOPROLOL TARTRATE (LOPRESSOR) 25 MG TABLET  Take 1 tablet (25 mg total) by mouth 2 (two) times daily.   OMEPRAZOLE (PRILOSEC) 20 MG CAPSULE    Take one daily   POTASSIUM CHLORIDE SA (K-DUR,KLOR-CON) 20 MEQ TABLET    Take 0.5 tablets (10 mEq total) by mouth daily.   RIVAROXABAN (XARELTO) 20 MG TABS TABLET    Take 20 mg by mouth daily.  Modified Medications   No medications on file  Discontinued Medications   No medications on file     Physical Exam: Physical Exam  Constitutional: She is oriented to person, place, and time. She appears well-developed and well-nourished.  HENT:  Head: Normocephalic and atraumatic.  Eyes: Conjunctivae and EOM are normal. Pupils are equal, round, and reactive to light.  Neck: Normal range of motion. Neck supple. No JVD present. No tracheal deviation present.  Cardiovascular: Normal rate and regular rhythm.   No murmur heard. Pulmonary/Chest: Effort normal and breath sounds normal. She has no wheezes. She has no rales.  Abdominal: Soft. Bowel sounds are normal. There is no tenderness.  Musculoskeletal: Normal range of motion. She exhibits no edema and no tenderness.  Ambulates with walker.   Lymphadenopathy:    She has no cervical adenopathy.  Neurological: She is alert and oriented to  person, place, and time. She has normal reflexes. No cranial nerve deficit. She exhibits normal muscle tone. Coordination normal.  Skin: Skin is warm and dry. No rash noted.  Psychiatric: Her affect is inappropriate. Her speech is delayed. She is not agitated, not aggressive, not slowed, not withdrawn and not actively hallucinating. Thought content is not paranoid and not delusional. Cognition and memory are impaired. She expresses inappropriate judgment. She does not express impulsivity. She exhibits a depressed mood. She exhibits abnormal recent memory.  Pulled off colostomy bag and milking the stoma 09/21/13. Doesn't feel like to eat or doing anything.    (type .physexam) Filed Vitals:   03/30/14 1336  BP: 110/60  Pulse: 72  Temp: 98.5 F (36.9 C)  TempSrc: Tympanic  Resp: 16      Labs reviewed: Basic Metabolic Panel:  Recent Labs  04/10/13 0929  05/01/13 10/12/13  02/07/14 02/15/14 03/19/14  NA 144  < > 140 141  < > 141 140 141  K 3.5  < > 4.0 4.0  < > 3.4 4.2 4.1  CO2 23  --   --   --   --   --   --   --   GLUCOSE 116  --   --   --   --   --   --   --   BUN 17.0  < > 19 18  < > 22* 14 14  CREATININE 0.9  < > 0.8 0.7  < > 0.8 0.7 0.6  CALCIUM 9.5  --   --   --   --   --   --   --   TSH  --   --   --  1.98  --   --   --   --   < > = values in this interval not displayed. Liver Function Tests:  Recent Labs  04/10/13 0929  10/12/13 11/20/13 01/11/14  AST 12  < > 12* 12* 13  ALT 11  < > 9 11 14   ALKPHOS 68  < > 52 60 64  BILITOT 0.75  --   --   --   --   PROT 6.6  --   --   --   --  ALBUMIN 3.3*  --   --   --   --   < > = values in this interval not displayed. CBC:  Recent Labs  04/10/13 0929  06/05/13 1019 10/12/13 11/20/13 01/11/14  WBC 7.1  < > 7.5 5.9 6.6 7.0  NEUTROABS 3.1  --  3.6  --   --   --   HGB 10.6*  < > 11.2* 10.0* 11.1* 10.2*  HCT 30.7*  < > 33.2* 30* 33* 31*  MCV 102.7*  --  99.6  --   --   --   PLT 224  < > 254 231 255 251  < > = values in  this interval not displayed.  Past Procedures:  11/03/13 X-ray T spine: marked vertebral body compression deformity at the approximate T12 level with 90% loss of body height. The patient is status post vertebroplasty at T12.   02/06/14 CXR no active cardiopulmonary disease.  Assessment/Plan Fall at nursing home Lack of safety awareness and increased frailty. Intensive supervision needed.   Severe major depression with psychotic features much better since Cymbalta increased and Namenda started and Abiliy 5mg  since 02/27/14. Her auditory hallucination has improved. Abilify started when she heard her father's voice. Now she stated that she misses him so much that sometimes she mistook somebody else voice as her father's. Prn Lorazepam 0.5mg  q6hr available to her. Pending Psych consult 04/06/14     Memory loss takes Namenda 28nmg and Cymbalta 60mg . No focal neurological deficits. Memory deficit and confusion. Easily redirected from her looking for her deceased mom, dad, or husband behaviors.      Unspecified constipation Managed. Continue Colace 100mg  bid.     Hypokalemia Well supplemented. Serum K 4.2. Continue Kcl 88meq daily.    GERD (gastroesophageal reflux disease) Stable, takes Omeprazole 20mg     Unspecified essential hypertension Controlled, continue Metoprolol 12.5mg  bid.     Long term (current) use of anticoagulants R+L PE and RLE DVT--Xarelto. May consider dc Xarelto if frequent falling continues.       Family/ Staff Communication: observe the patient.   Goals of Care: SNF   Labs/tests ordered: none

## 2014-03-30 NOTE — Assessment & Plan Note (Signed)
Managed. Continue Colace 100mg  bid.

## 2014-03-30 NOTE — Assessment & Plan Note (Signed)
Stable, takes Omeprazole 20mg 

## 2014-03-30 NOTE — Assessment & Plan Note (Signed)
R+L PE and RLE DVT--Xarelto. May consider dc Xarelto if frequent falling continues.

## 2014-03-30 NOTE — Assessment & Plan Note (Signed)
Controlled, continue Metoprolol 12.5mg bid.  

## 2014-03-30 NOTE — Assessment & Plan Note (Signed)
much better since Cymbalta increased and Namenda started and Abiliy 5mg  since 02/27/14. Her auditory hallucination has improved. Abilify started when she heard her father's voice. Now she stated that she misses him so much that sometimes she mistook somebody else voice as her father's. Prn Lorazepam 0.5mg  q6hr available to her. Pending Psych consult 04/06/14

## 2014-03-30 NOTE — Assessment & Plan Note (Signed)
Lack of safety awareness and increased frailty. Intensive supervision needed.

## 2014-03-30 NOTE — Assessment & Plan Note (Signed)
takes Namenda 28nmg and Cymbalta 60mg . No focal neurological deficits. Memory deficit and confusion. Easily redirected from her looking for her deceased mom, dad, or husband behaviors.

## 2014-03-30 NOTE — Assessment & Plan Note (Signed)
Well supplemented. Serum K 4.2. Continue Kcl 60meq daily.

## 2014-04-13 ENCOUNTER — Encounter: Payer: Self-pay | Admitting: Nurse Practitioner

## 2014-04-13 ENCOUNTER — Non-Acute Institutional Stay (SKILLED_NURSING_FACILITY): Payer: Medicare Other | Admitting: Nurse Practitioner

## 2014-04-13 DIAGNOSIS — K219 Gastro-esophageal reflux disease without esophagitis: Secondary | ICD-10-CM

## 2014-04-13 DIAGNOSIS — R413 Other amnesia: Secondary | ICD-10-CM

## 2014-04-13 DIAGNOSIS — K5900 Constipation, unspecified: Secondary | ICD-10-CM

## 2014-04-13 DIAGNOSIS — F333 Major depressive disorder, recurrent, severe with psychotic symptoms: Secondary | ICD-10-CM

## 2014-04-13 DIAGNOSIS — Z7901 Long term (current) use of anticoagulants: Secondary | ICD-10-CM

## 2014-04-13 DIAGNOSIS — I10 Essential (primary) hypertension: Secondary | ICD-10-CM

## 2014-04-13 DIAGNOSIS — E876 Hypokalemia: Secondary | ICD-10-CM

## 2014-04-13 NOTE — Assessment & Plan Note (Signed)
Managed. Continue Colace 100mg  bid and Enulose 60mg  daily

## 2014-04-13 NOTE — Assessment & Plan Note (Signed)
R+L PE and RLE DVT--Xarelto. May consider dc Xarelto if frequent falling continues.

## 2014-04-13 NOTE — Assessment & Plan Note (Signed)
takes Namenda 28nmg and Cymbalta 60mg . Usual state of mentation.

## 2014-04-13 NOTE — Assessment & Plan Note (Signed)
Less psychotic presentation, but still c/o psychological pain. Staff reported crying episodes x4 in the past 11 days. Takes Cymbalta 60mg  daily and Abilify 5mg  daily. Prn Lorazepam 0.5mg  q6hr available to her. Will increase Abilify to 10mg  daily for better mood management. Observe for negative side effects.

## 2014-04-13 NOTE — Assessment & Plan Note (Signed)
Well supplemented. Serum K 4.1 03/19/14. Continue Kcl 15meq daily.

## 2014-04-13 NOTE — Progress Notes (Signed)
Patient ID: Erin Hansen, female   DOB: [birth year], 80 y.o.   MRN: [id number]   Code Status: DNR  Allergies  Allergen Reactions  . Codeine Nausea Only    Chief Complaint  Patient presents with  . Medical Management of Chronic Issues  . Acute Visit    c/o psychological pain, crying episodes x4/11 days, stays in bed more.     HPI: Patient is a 80 y.o. female seen in the SNF at [facility] today for  evaluation of uncontrolled depression with psychotic features, low blood pressure, and chronic medical conditions Problem List Items Addressed This Visit   Severe major depression with psychotic features - Primary     Less psychotic presentation, but still c/o psychological pain. Staff reported crying episodes x4 in the past 11 days. Takes Cymbalta 60mg  daily and Abilify 5mg  daily. Prn Lorazepam 0.5mg  q6hr available to her. Will increase Abilify to 10mg  daily for better mood management. Observe for negative side effects.        Long term (current) use of anticoagulants     R+L PE and RLE DVT--Xarelto. May consider dc Xarelto if frequent falling continues.       Unspecified essential hypertension     Controlled, continue Metoprolol 12.5mg  bid.        Memory loss     takes Namenda 28nmg and Cymbalta 60mg . Usual state of mentation.       GERD (gastroesophageal reflux disease)     Stable, takes Omeprazole 20mg       Hypokalemia     Well supplemented. Serum K 4.1 03/19/14. Continue Kcl 39meq daily.       Unspecified constipation     Managed. Continue Colace 100mg  bid and Enulose 60mg  daily         Review of Systems:  Review of Systems  Constitutional: Positive for malaise/fatigue. Negative for fever, chills, weight loss and diaphoresis.  HENT: Positive for hearing loss. Negative for congestion, ear discharge and sore throat.   Eyes: Negative for blurred vision, pain, discharge and redness.  Respiratory: Positive for cough. Negative for sputum production,  shortness of breath and wheezing.   Cardiovascular: Negative for chest pain, palpitations, orthopnea, claudication, leg swelling and PND.  Gastrointestinal: Negative for heartburn, nausea, vomiting, abdominal pain, diarrhea, constipation and blood in stool.       Colostomy  Genitourinary: Positive for frequency (incontinent of bladder). Negative for dysuria, urgency and hematuria.  Musculoskeletal: Positive for back pain and falls. Negative for joint pain, myalgias and neck pain.       Mid back on and off-no c/o today [place] 03/29/14 w/o apparent injury.   Skin: Negative for itching and rash.  Neurological: Negative for dizziness, tingling, tremors, sensory change, speech change, focal weakness, seizures, loss of consciousness, weakness and headaches.  Endo/Heme/Allergies: Negative for environmental allergies and polydipsia. Does not bruise/bleed easily.  Psychiatric/Behavioral: Positive for depression and memory loss. Negative for hallucinations. The patient is nervous/anxious and has insomnia.        Increased confusion. Less psychotic presentation. But still c/o psychological pain. Staff reported crying episodes x4 in the past 11 days. POA cancelled Psychiatry appointment.      Past Medical History  Diagnosis Date  . DJD (degenerative joint disease)   . Compression fracture     t12  . IBS (irritable bowel syndrome)   . Vaginal prolapse   . TMJ (temporomandibular joint disorder)   . Anemia   . Hearing loss   . Blood in stool   .  Anxiety   . Cancer     Colon  . Unspecified essential hypertension 11/18/2012  . Coronary atherosclerosis of native coronary artery 10/25/2012  . Long term (current) use of anticoagulants 10/25/2012  . Other pulmonary embolism and infarction 10/15/2012  . Phlebitis and thrombophlebitis of other deep vessels of lower extremities 10/15/2012  . Malignant neoplasm of colon, unspecified site 09/27/2012  . 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 Vitamin D deficiency 09/27/2012  . Hypopotassemia 09/27/2012  . Major depressive disorder, single episode, unspecified 09/27/2012  . Blepharitis, unspecified 09/27/2012  . Diffuse cystic mastopathy 09/27/2012  . Degeneration of intervertebral disc, site unspecified 09/27/2012  . Muscle weakness (generalized) 09/27/2012  . Abnormal involuntary movements(781.0) 09/27/2012  . Edema 09/27/2012  . Unspecified urinary incontinence 09/27/2012  . Colostomy status 09/27/2012   Past Surgical History  Procedure Laterality Date  . Appendectomy    . Vaginal prolapse repair  2005 - approximate  . Cystectomy      back  . Nasal reconstruction with septal repair  1983  . Skin cancer excision    . Arthroscopy left knee  2007  . Colonoscopy    . Total abdominal hysterectomy  1978  . Hernia repair  1980 - approximate  . Eye surgery  [phone]    cataracts bilateral  . Back surgery      mid back  . Laparoscopic sigmoid colectomy  09/20/2012    Procedure: LAPAROSCOPIC SIGMOID COLECTOMY;  Surgeon: Rolm Bookbinder, MD;  Location: [facility];  Service: General;  Laterality: N/A;  Laparoscopic possible Open Sigmoid Colectomy,Colostomy  . Colostomy  09/20/2012    Procedure: COLOSTOMY;  Surgeon: Rolm Bookbinder, MD;  Location: [facility];  Service: General;  Laterality: N/A;   Social History:   reports that she has never smoked. She has never used smokeless tobacco. She reports that she does not drink alcohol or use illicit drugs.  Family History  Problem Relation Age of Onset  . Cancer Son     spine  . Cancer Son     Medications: Patient's Medications  New Prescriptions   No medications on file  Previous Medications   ACETAMINOPHEN (TYLENOL) 500 MG TABLET    Take 1,000 mg by mouth every 4 (four) hours as needed for pain.   ALBUTEROL (PROVENTIL) (2.5 MG/3ML) 0.083% NEBULIZER SOLUTION    Take 2.5 mg by nebulization. Use one via nebulizer every 6 hours a needed for SOB or wheezing   ARIPIPRAZOLE  (ABILIFY) 10 MG TABLET    Take 10 mg by mouth daily.   BENZONATATE (TESSALON) 100 MG CAPSULE    Tale 1 capsule by mouth twice daily as needed for cough   DOCUSATE SODIUM (COLACE) 100 MG CAPSULE    Take 1 capsule (100 mg total) by mouth 2 (two) times daily.   DULOXETINE (CYMBALTA) 60 MG CAPSULE    Take 60 mg by mouth daily.   ENULOSE 10 GM/15ML SOLN    40 g daily.    FEEDING SUPPLEMENT (ENSURE COMPLETE) LIQD    Take 237 mLs by mouth 2 (two) times daily between meals.   LORAZEPAM (ATIVAN) 0.5 MG TABLET    Take 0.5 mg by mouth every 6 (six) hours as needed for anxiety.   MEMANTINE (NAMENDA) 10 MG TABLET    Take 28 mg by mouth daily.   METOPROLOL TARTRATE (LOPRESSOR) 25 MG TABLET    Take 1 tablet (25 mg total) by mouth 2 (two) times daily.   OMEPRAZOLE (  PRILOSEC) 20 MG CAPSULE    Take one daily   POTASSIUM CHLORIDE SA (K-DUR,KLOR-CON) 20 MEQ TABLET    Take 0.5 tablets (10 mEq total) by mouth daily.   RIVAROXABAN (XARELTO) 20 MG TABS TABLET    Take 20 mg by mouth daily.  Modified Medications   No medications on file  Discontinued Medications   ARIPIPRAZOLE (ABILIFY) 5 MG TABLET    Take 5 mg by mouth daily.     Physical Exam: Physical Exam  Constitutional: She is oriented to person, place, and time. She appears well-developed and well-nourished.  HENT:  Head: Normocephalic and atraumatic.  Eyes: Conjunctivae and EOM are normal. Pupils are equal, round, and reactive to light.  Neck: Normal range of motion. Neck supple. No JVD present. No tracheal deviation present.  Cardiovascular: Normal rate and regular rhythm.   No murmur heard. Pulmonary/Chest: Effort normal and breath sounds normal. She has no wheezes. She has no rales.  Abdominal: Soft. Bowel sounds are normal. There is no tenderness.  Musculoskeletal: Normal range of motion. She exhibits no edema and no tenderness.  Ambulates with walker.   Lymphadenopathy:    She has no cervical adenopathy.  Neurological: She is alert and oriented  to person, place, and time. She has normal reflexes. No cranial nerve deficit. She exhibits normal muscle tone. Coordination normal.  Skin: Skin is warm and dry. No rash noted.  Psychiatric: Her affect is inappropriate. Her speech is delayed. She is not agitated, not aggressive, not slowed, not withdrawn and not actively hallucinating. Thought content is not paranoid and not delusional. Cognition and memory are impaired. She expresses inappropriate judgment. She does not express impulsivity. She exhibits a depressed mood. She exhibits abnormal recent memory.  Pulled off colostomy bag and milking the stoma 09/21/13. Doesn't feel like to eat or doing anything.    (type .physexam) Filed Vitals:   04/13/14 1206  BP: 120/64  Pulse: 84  Temp: 98.3 F (36.8 C)  TempSrc: Tympanic  Resp: 20      Labs reviewed: Basic Metabolic Panel:  Recent Labs  05/01/13 10/12/13  02/07/14 02/15/14 03/19/14  NA 140 141  < > 141 140 141  K 4.0 4.0  < > 3.4 4.2 4.1  BUN 19 18  < > 22* 14 14  CREATININE 0.8 0.7  < > 0.8 0.7 0.6  TSH  --  1.98  --   --   --   --   < > = values in this interval not displayed. Liver Function Tests:  Recent Labs  10/12/13 11/20/13 01/11/14  AST 12* 12* 13  ALT 9 11 14   ALKPHOS 52 60 64   CBC:  Recent Labs  06/05/13 1019 10/12/13 11/20/13 01/11/14  WBC 7.5 5.9 6.6 7.0  NEUTROABS 3.6  --   --   --   HGB 11.2* 10.0* 11.1* 10.2*  HCT 33.2* 30* 33* 31*  MCV 99.6  --   --   --   PLT 254 231 255 251    Past Procedures:  11/03/13 X-ray T spine: marked vertebral body compression deformity at the approximate T12 level with 90% loss of body height. The patient is status post vertebroplasty at T12.   02/06/14 CXR no active cardiopulmonary disease.  Assessment/Plan Severe major depression with psychotic features Less psychotic presentation, but still c/o psychological pain. Staff reported crying episodes x4 in the past 11 days. Takes Cymbalta 60mg  daily and Abilify 5mg   daily. Prn Lorazepam 0.5mg  q6hr available to her.  Will increase Abilify to 10mg  daily for better mood management. Observe for negative side effects.      Unspecified essential hypertension Controlled, continue Metoprolol 12.5mg  bid.      GERD (gastroesophageal reflux disease) Stable, takes Omeprazole 20mg     Unspecified constipation Managed. Continue Colace 100mg  bid and Enulose 60mg  daily    Long term (current) use of anticoagulants R+L PE and RLE DVT--Xarelto. May consider dc Xarelto if frequent falling continues.     Memory loss takes Namenda 28nmg and Cymbalta 60mg . Usual state of mentation.     Hypokalemia Well supplemented. Serum K 4.1 03/19/14. Continue Kcl 19meq daily.       Family/ Staff Communication: observe the patient.   Goals of Care: SNF   Labs/tests ordered: none

## 2014-04-13 NOTE — Assessment & Plan Note (Signed)
Controlled, continue Metoprolol 12.5mg bid.  

## 2014-04-13 NOTE — Assessment & Plan Note (Signed)
Stable, takes Omeprazole 20mg 

## 2014-04-20 ENCOUNTER — Encounter: Payer: Self-pay | Admitting: Nurse Practitioner

## 2014-04-20 ENCOUNTER — Non-Acute Institutional Stay (SKILLED_NURSING_FACILITY): Payer: Medicare Other | Admitting: Nurse Practitioner

## 2014-04-20 DIAGNOSIS — F333 Major depressive disorder, recurrent, severe with psychotic symptoms: Secondary | ICD-10-CM

## 2014-04-20 DIAGNOSIS — R634 Abnormal weight loss: Secondary | ICD-10-CM

## 2014-04-20 DIAGNOSIS — Z7901 Long term (current) use of anticoagulants: Secondary | ICD-10-CM

## 2014-04-20 DIAGNOSIS — K219 Gastro-esophageal reflux disease without esophagitis: Secondary | ICD-10-CM

## 2014-04-20 DIAGNOSIS — I10 Essential (primary) hypertension: Secondary | ICD-10-CM

## 2014-04-20 DIAGNOSIS — R413 Other amnesia: Secondary | ICD-10-CM

## 2014-04-20 NOTE — Progress Notes (Signed)
Patient ID: Erin Hansen, female   DOB: March 27, 1926, 80 y.o.   MRN: [id number]   Code Status: DNR  Allergies  Allergen Reactions  . Codeine Nausea Only    Chief Complaint  Patient presents with  . Medical Management of Chronic Issues  . Acute Visit    confusion.     HPI: Patient is a 80 y.o. female seen in the SNF at [facility] today for  evaluation of confusion,  depression with psychotic features,  and chronic medical conditions Problem List Items Addressed This Visit   Severe major depression with psychotic features - Primary     No longer c/o psychological pain. Staff reported crying episodes x4 in the past 11 days prior to Abilify 10mg  04/13/14-too soon to eval. The patient was seen well dressed in her room and versed calmly with lots of confusion-she told me her deceased husband was out there working today.  Takes Cymbalta 60mg  daily and Prn Lorazepam 0.5mg  q6hr available to her.  Observe for negative side effects. She said occasionally she doesn't sleep well at night.       Long term (current) use of anticoagulants     R+L PE and RLE DVT--Xarelto 03/16/14 Oncology 74mos f/u: 2 yr from DVT/PE that occurred post colon surgery-consider continue anticoagulation.       Unspecified essential hypertension     Controlled, continue Metoprolol 12.5mg  bid.       Memory loss     takes Namenda 28nmg and Cymbalta 60mg . Usual state of mentation. Likely vascular dementia is nature in setting of hx PE/DVT. Repeat Memory Test.       GERD (gastroesophageal reflux disease)     Stable, takes Omeprazole 20mg       Loss of weight     Monthly weight: Aug 142, July 144, June 146, May 146, April 145. Will weight weekly. Update CBC and CMP       Review of Systems:  Review of Systems  Constitutional: Positive for malaise/fatigue. Negative for fever, chills, weight loss and diaphoresis.  HENT: Positive for hearing loss. Negative for congestion, ear discharge and sore throat.   Eyes:  Negative for blurred vision, pain, discharge and redness.  Respiratory: Positive for cough. Negative for sputum production, shortness of breath and wheezing.   Cardiovascular: Negative for chest pain, palpitations, orthopnea, claudication, leg swelling and PND.  Gastrointestinal: Negative for heartburn, nausea, vomiting, abdominal pain, diarrhea, constipation and blood in stool.       Colostomy  Genitourinary: Positive for frequency (incontinent of bladder). Negative for dysuria, urgency and hematuria.  Musculoskeletal: Positive for back pain and falls. Negative for joint pain, myalgias and neck pain.       Mid back on and off-no c/o today [place] 03/29/14 w/o apparent injury.   Skin: Negative for itching and rash.  Neurological: Negative for dizziness, tingling, tremors, sensory change, speech change, focal weakness, seizures, loss of consciousness, weakness and headaches.  Endo/Heme/Allergies: Negative for environmental allergies and polydipsia. Does not bruise/bleed easily.  Psychiatric/Behavioral: Positive for depression and memory loss. Negative for hallucinations. The patient is nervous/anxious and has insomnia.        Increased confusion. Less psychotic presentation. But still c/o psychological pain. Staff reported crying episodes x4 in the past 11 days. POA cancelled Psychiatry appointment.      Past Medical History  Diagnosis Date  . DJD (degenerative joint disease)   . Compression fracture     t12  . IBS (irritable bowel syndrome)   .  Vaginal prolapse   . TMJ (temporomandibular joint disorder)   . Anemia   . Hearing loss   . Blood in stool   . Anxiety   . Cancer     Colon  . Unspecified essential hypertension 11/18/2012  . Coronary atherosclerosis of native coronary artery 10/25/2012  . Long term (current) use of anticoagulants 10/25/2012  . Other pulmonary embolism and infarction 10/15/2012  . Phlebitis and thrombophlebitis of other deep vessels of lower extremities 10/15/2012  .  Malignant neoplasm of colon, unspecified site 09/27/2012  . 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 Vitamin D deficiency 09/27/2012  . Hypopotassemia 09/27/2012  . Major depressive disorder, single episode, unspecified 09/27/2012  . Blepharitis, unspecified 09/27/2012  . Diffuse cystic mastopathy 09/27/2012  . Degeneration of intervertebral disc, site unspecified 09/27/2012  . Muscle weakness (generalized) 09/27/2012  . Abnormal involuntary movements(781.0) 09/27/2012  . Edema 09/27/2012  . Unspecified urinary incontinence 09/27/2012  . Colostomy status 09/27/2012   Past Surgical History  Procedure Laterality Date  . Appendectomy    . Vaginal prolapse repair  2005 - approximate  . Cystectomy      back  . Nasal reconstruction with septal repair  1983  . Skin cancer excision    . Arthroscopy left knee  2007  . Colonoscopy    . Total abdominal hysterectomy  1978  . Hernia repair  1980 - approximate  . Eye surgery  [phone]    cataracts bilateral  . Back surgery      mid back  . Laparoscopic sigmoid colectomy  09/20/2012    Procedure: LAPAROSCOPIC SIGMOID COLECTOMY;  Surgeon: Rolm Bookbinder, MD;  Location: [facility];  Service: General;  Laterality: N/A;  Laparoscopic possible Open Sigmoid Colectomy,Colostomy  . Colostomy  09/20/2012    Procedure: COLOSTOMY;  Surgeon: Rolm Bookbinder, MD;  Location: [facility];  Service: General;  Laterality: N/A;   Social History:   reports that she has never smoked. She has never used smokeless tobacco. She reports that she does not drink alcohol or use illicit drugs.  Family History  Problem Relation Age of Onset  . Cancer Son     spine  . Cancer Son     Medications: Patient's Medications  New Prescriptions   No medications on file  Previous Medications   ACETAMINOPHEN (TYLENOL) 500 MG TABLET    Take 1,000 mg by mouth every 4 (four) hours as needed for pain.   ALBUTEROL (PROVENTIL) (2.5 MG/3ML) 0.083% NEBULIZER  SOLUTION    Take 2.5 mg by nebulization. Use one via nebulizer every 6 hours a needed for SOB or wheezing   ARIPIPRAZOLE (ABILIFY) 10 MG TABLET    Take 10 mg by mouth daily.   BENZONATATE (TESSALON) 100 MG CAPSULE    Tale 1 capsule by mouth twice daily as needed for cough   DOCUSATE SODIUM (COLACE) 100 MG CAPSULE    Take 1 capsule (100 mg total) by mouth 2 (two) times daily.   DULOXETINE (CYMBALTA) 60 MG CAPSULE    Take 60 mg by mouth daily.   ENULOSE 10 GM/15ML SOLN    40 g daily.    FEEDING SUPPLEMENT (ENSURE COMPLETE) LIQD    Take 237 mLs by mouth 2 (two) times daily between meals.   LORAZEPAM (ATIVAN) 0.5 MG TABLET    Take 0.5 mg by mouth every 6 (six) hours as needed for anxiety.   MEMANTINE (NAMENDA) 10 MG TABLET    Take 28 mg by mouth  daily.   METOPROLOL TARTRATE (LOPRESSOR) 25 MG TABLET    Take 1 tablet (25 mg total) by mouth 2 (two) times daily.   OMEPRAZOLE (PRILOSEC) 20 MG CAPSULE    Take one daily   POTASSIUM CHLORIDE SA (K-DUR,KLOR-CON) 20 MEQ TABLET    Take 0.5 tablets (10 mEq total) by mouth daily.   RIVAROXABAN (XARELTO) 20 MG TABS TABLET    Take 20 mg by mouth daily.  Modified Medications   No medications on file  Discontinued Medications   No medications on file     Physical Exam: Physical Exam  Constitutional: She is oriented to person, place, and time. She appears well-developed and well-nourished.  HENT:  Head: Normocephalic and atraumatic.  Eyes: Conjunctivae and EOM are normal. Pupils are equal, round, and reactive to light.  Neck: Normal range of motion. Neck supple. No JVD present. No tracheal deviation present.  Cardiovascular: Normal rate and regular rhythm.   No murmur heard. Pulmonary/Chest: Effort normal and breath sounds normal. She has no wheezes. She has no rales.  Abdominal: Soft. Bowel sounds are normal. There is no tenderness.  Musculoskeletal: Normal range of motion. She exhibits no edema and no tenderness.  Ambulates with walker.     Lymphadenopathy:    She has no cervical adenopathy.  Neurological: She is alert and oriented to person, place, and time. She has normal reflexes. No cranial nerve deficit. She exhibits normal muscle tone. Coordination normal.  Skin: Skin is warm and dry. No rash noted.  Psychiatric: Her affect is inappropriate. Her speech is delayed. She is not agitated, not aggressive, not slowed, not withdrawn and not actively hallucinating. Thought content is not paranoid and not delusional. Cognition and memory are impaired. She expresses inappropriate judgment. She does not express impulsivity. She exhibits a depressed mood. She exhibits abnormal recent memory.  Pulled off colostomy bag and milking the stoma 09/21/13. Doesn't feel like to eat or doing anything.    (type .physexam) Filed Vitals:   04/20/14 1245  BP: 116/52  Pulse: 70  Temp: 98.2 F (36.8 C)  TempSrc: Tympanic  Resp: 20      Labs reviewed: Basic Metabolic Panel:  Recent Labs  05/01/13 10/12/13  02/07/14 02/15/14 03/19/14  NA 140 141  < > 141 140 141  K 4.0 4.0  < > 3.4 4.2 4.1  BUN 19 18  < > 22* 14 14  CREATININE 0.8 0.7  < > 0.8 0.7 0.6  TSH  --  1.98  --   --   --   --   < > = values in this interval not displayed. Liver Function Tests:  Recent Labs  10/12/13 11/20/13 01/11/14  AST 12* 12* 13  ALT 9 11 14   ALKPHOS 52 60 64   CBC:  Recent Labs  06/05/13 1019 10/12/13 11/20/13 01/11/14  WBC 7.5 5.9 6.6 7.0  NEUTROABS 3.6  --   --   --   HGB 11.2* 10.0* 11.1* 10.2*  HCT 33.2* 30* 33* 31*  MCV 99.6  --   --   --   PLT 254 231 255 251    Past Procedures:  11/03/13 X-ray T spine: marked vertebral body compression deformity at the approximate T12 level with 90% loss of body height. The patient is status post vertebroplasty at T12.   02/06/14 CXR no active cardiopulmonary disease.  Assessment/Plan Severe major depression with psychotic features No longer c/o psychological pain. Staff reported crying episodes x4  in the past 11 days  prior to Abilify 10mg  04/13/14-too soon to eval. The patient was seen well dressed in her room and versed calmly with lots of confusion-she told me her deceased husband was out there working today.  Takes Cymbalta 60mg  daily and Prn Lorazepam 0.5mg  q6hr available to her.  Observe for negative side effects. She said occasionally she doesn't sleep well at night.     Memory loss takes Namenda 28nmg and Cymbalta 60mg . Usual state of mentation. Likely vascular dementia is nature in setting of hx PE/DVT. Repeat Memory Test.     Loss of weight Monthly weight: Aug 142, July 144, June 146, May 146, April 145. Will weight weekly. Update CBC and CMP  GERD (gastroesophageal reflux disease) Stable, takes Omeprazole 20mg     Unspecified essential hypertension Controlled, continue Metoprolol 12.5mg  bid.     Long term (current) use of anticoagulants R+L PE and RLE DVT--Xarelto 03/16/14 Oncology 37mos f/u: 2 yr from DVT/PE that occurred post colon surgery-consider continue anticoagulation.       Family/ Staff Communication: observe the patient.   Goals of Care: SNF   Labs/tests ordered: CBC and CMP. Memory Test.

## 2014-04-20 NOTE — Assessment & Plan Note (Signed)
No longer c/o psychological pain. Staff reported crying episodes x4 in the past 11 days prior to Abilify 10mg  04/13/14-too soon to eval. The patient was seen well dressed in her room and versed calmly with lots of confusion-she told me her deceased husband was out there working today.  Takes Cymbalta 60mg  daily and Prn Lorazepam 0.5mg  q6hr available to her.  Observe for negative side effects. She said occasionally she doesn't sleep well at night.

## 2014-04-20 NOTE — Assessment & Plan Note (Signed)
Stable, takes Omeprazole 20mg 

## 2014-04-20 NOTE — Assessment & Plan Note (Signed)
Controlled, continue Metoprolol 12.5mg bid.  

## 2014-04-20 NOTE — Assessment & Plan Note (Signed)
takes Namenda 28nmg and Cymbalta 60mg . Usual state of mentation. Likely vascular dementia is nature in setting of hx PE/DVT. Repeat Memory Test.

## 2014-04-20 NOTE — Assessment & Plan Note (Signed)
R+L PE and RLE DVT--Xarelto 03/16/14 Oncology 65mos f/u: 2 yr from DVT/PE that occurred post colon surgery-consider continue anticoagulation.

## 2014-04-20 NOTE — Assessment & Plan Note (Signed)
Monthly weight: Aug 142, July 144, June 146, May 146, April 145. Will weight weekly. Update CBC and CMP

## 2014-04-23 LAB — HEPATIC FUNCTION PANEL
ALK PHOS: 61 U/L (ref 25–125)
ALT: 8 U/L (ref 7–35)
AST: 10 U/L — AB (ref 13–35)
BILIRUBIN, TOTAL: 0.7 mg/dL

## 2014-04-23 LAB — CBC AND DIFFERENTIAL
HCT: 34 % — AB (ref 36–46)
Hemoglobin: 11.1 g/dL — AB (ref 12.0–16.0)
Platelets: 246 10*3/uL (ref 150–399)
WBC: 6.3 10^3/mL

## 2014-04-23 LAB — BASIC METABOLIC PANEL
BUN: 18 mg/dL (ref 4–21)
Creatinine: 0.7 mg/dL (ref 0.5–1.1)
Glucose: 127 mg/dL
Potassium: 3.6 mmol/L (ref 3.4–5.3)
Sodium: 140 mmol/L (ref 137–147)

## 2014-04-24 ENCOUNTER — Other Ambulatory Visit: Payer: Self-pay | Admitting: Nurse Practitioner

## 2014-05-04 ENCOUNTER — Encounter (HOSPITAL_COMMUNITY): Payer: Self-pay | Admitting: Emergency Medicine

## 2014-05-04 ENCOUNTER — Emergency Department (HOSPITAL_COMMUNITY): Payer: Medicare Other

## 2014-05-04 ENCOUNTER — Encounter: Payer: Self-pay | Admitting: Nurse Practitioner

## 2014-05-04 ENCOUNTER — Inpatient Hospital Stay (HOSPITAL_COMMUNITY)
Admission: EM | Admit: 2014-05-04 | Discharge: 2014-05-06 | DRG: 536 | Disposition: A | Discharge status: Skilled Nursing Facility | Payer: Medicare Other | Attending: Internal Medicine | Admitting: Internal Medicine

## 2014-05-04 DIAGNOSIS — Z86718 Personal history of other venous thrombosis and embolism: Secondary | ICD-10-CM

## 2014-05-04 DIAGNOSIS — S72009A Fracture of unspecified part of neck of unspecified femur, initial encounter for closed fracture: Secondary | ICD-10-CM | POA: Diagnosis present

## 2014-05-04 DIAGNOSIS — Y9210 Unspecified residential institution as the place of occurrence of the external cause: Secondary | ICD-10-CM | POA: Diagnosis present

## 2014-05-04 DIAGNOSIS — Z933 Colostomy status: Secondary | ICD-10-CM

## 2014-05-04 DIAGNOSIS — W19XXXA Unspecified fall, initial encounter: Secondary | ICD-10-CM | POA: Diagnosis present

## 2014-05-04 DIAGNOSIS — I4891 Unspecified atrial fibrillation: Secondary | ICD-10-CM

## 2014-05-04 DIAGNOSIS — F0390 Unspecified dementia without behavioral disturbance: Secondary | ICD-10-CM | POA: Diagnosis present

## 2014-05-04 DIAGNOSIS — D649 Anemia, unspecified: Secondary | ICD-10-CM | POA: Diagnosis present

## 2014-05-04 DIAGNOSIS — Z66 Do not resuscitate: Secondary | ICD-10-CM | POA: Diagnosis present

## 2014-05-04 DIAGNOSIS — Y92129 Unspecified place in nursing home as the place of occurrence of the external cause: Secondary | ICD-10-CM

## 2014-05-04 DIAGNOSIS — Z86711 Personal history of pulmonary embolism: Secondary | ICD-10-CM

## 2014-05-04 DIAGNOSIS — S32409A Unspecified fracture of unspecified acetabulum, initial encounter for closed fracture: Principal | ICD-10-CM | POA: Diagnosis present

## 2014-05-04 DIAGNOSIS — Z7901 Long term (current) use of anticoagulants: Secondary | ICD-10-CM

## 2014-05-04 DIAGNOSIS — S32402A Unspecified fracture of left acetabulum, initial encounter for closed fracture: Secondary | ICD-10-CM | POA: Diagnosis present

## 2014-05-04 DIAGNOSIS — N390 Urinary tract infection, site not specified: Secondary | ICD-10-CM | POA: Diagnosis present

## 2014-05-04 DIAGNOSIS — Z85038 Personal history of other malignant neoplasm of large intestine: Secondary | ICD-10-CM

## 2014-05-04 LAB — URINALYSIS, ROUTINE W REFLEX MICROSCOPIC
Bilirubin Urine: NEGATIVE
Glucose, UA: NEGATIVE mg/dL
Hgb urine dipstick: NEGATIVE
Ketones, ur: NEGATIVE mg/dL
NITRITE: NEGATIVE
Protein, ur: NEGATIVE mg/dL
SPECIFIC GRAVITY, URINE: 1.025 (ref 1.005–1.030)
Urobilinogen, UA: 0.2 mg/dL (ref 0.0–1.0)
pH: 5.5 (ref 5.0–8.0)

## 2014-05-04 LAB — URINE MICROSCOPIC-ADD ON

## 2014-05-04 LAB — BASIC METABOLIC PANEL
ANION GAP: 11 (ref 5–15)
BUN: 19 mg/dL (ref 6–23)
CHLORIDE: 101 meq/L (ref 96–112)
CO2: 27 meq/L (ref 19–32)
CREATININE: 0.72 mg/dL (ref 0.50–1.10)
Calcium: 9.2 mg/dL (ref 8.4–10.5)
GFR calc Af Amer: 88 mL/min — ABNORMAL LOW (ref >90)
GFR calc non Af Amer: 76 mL/min — ABNORMAL LOW (ref >90)
Glucose, Bld: 118 mg/dL — ABNORMAL HIGH (ref 70–99)
POTASSIUM: 3.8 meq/L (ref 3.7–5.3)
Sodium: 139 mEq/L (ref 137–147)

## 2014-05-04 LAB — CBC WITH DIFFERENTIAL/PLATELET
BASOS ABS: 0 10*3/uL (ref 0.0–0.1)
BASOS PCT: 0 % (ref 0–1)
Eosinophils Absolute: 0 10*3/uL (ref 0.0–0.7)
Eosinophils Relative: 0 % (ref 0–5)
HEMATOCRIT: 34.4 % — AB (ref 36.0–46.0)
Hemoglobin: 11.4 g/dL — ABNORMAL LOW (ref 12.0–15.0)
LYMPHS PCT: 19 % (ref 12–46)
Lymphs Abs: 2.1 10*3/uL (ref 0.7–4.0)
MCH: 29.7 pg (ref 26.0–34.0)
MCHC: 33.1 g/dL (ref 30.0–36.0)
MCV: 89.6 fL (ref 78.0–100.0)
MONO ABS: 0.7 10*3/uL (ref 0.1–1.0)
Monocytes Relative: 7 % (ref 3–12)
NEUTROS ABS: 8 10*3/uL — AB (ref 1.7–7.7)
NEUTROS PCT: 74 % (ref 43–77)
Platelets: 235 10*3/uL (ref 150–400)
RBC: 3.84 MIL/uL — ABNORMAL LOW (ref 3.87–5.11)
RDW: 12.8 % (ref 11.5–15.5)
WBC: 10.9 10*3/uL — AB (ref 4.0–10.5)

## 2014-05-04 MED ORDER — METOPROLOL TARTRATE 25 MG PO TABS
25.0000 mg | ORAL_TABLET | Freq: Two times a day (BID) | ORAL | Status: DC
  Administered 2014-05-04 – 2014-05-06 (×5): 25 mg via ORAL
  Filled 2014-05-04 (×6): qty 1

## 2014-05-04 MED ORDER — FENTANYL CITRATE 0.05 MG/ML IJ SOLN
100.0000 ug | Freq: Once | INTRAMUSCULAR | Status: AC
  Administered 2014-05-04: 100 ug via INTRAVENOUS
  Filled 2014-05-04: qty 2

## 2014-05-04 MED ORDER — CEFTRIAXONE SODIUM 1 G IJ SOLR
1.0000 g | Freq: Once | INTRAMUSCULAR | Status: DC
  Filled 2014-05-04: qty 10

## 2014-05-04 MED ORDER — FENTANYL CITRATE 0.05 MG/ML IJ SOLN
25.0000 ug | INTRAMUSCULAR | Status: DC | PRN
  Administered 2014-05-04 – 2014-05-05 (×2): 25 ug via INTRAVENOUS
  Filled 2014-05-04 (×3): qty 2

## 2014-05-04 MED ORDER — DEXTROSE 5 % IV SOLN
1.0000 g | INTRAVENOUS | Status: DC
  Administered 2014-05-04 – 2014-05-05 (×2): 1 g via INTRAVENOUS
  Filled 2014-05-04 (×3): qty 10

## 2014-05-04 MED ORDER — ARIPIPRAZOLE 10 MG PO TABS
10.0000 mg | ORAL_TABLET | Freq: Every day | ORAL | Status: DC
  Administered 2014-05-04 – 2014-05-06 (×3): 10 mg via ORAL
  Filled 2014-05-04 (×3): qty 1

## 2014-05-04 MED ORDER — SODIUM CHLORIDE 0.9 % IJ SOLN
3.0000 mL | Freq: Two times a day (BID) | INTRAMUSCULAR | Status: DC
  Administered 2014-05-04 – 2014-05-05 (×3): 3 mL via INTRAVENOUS

## 2014-05-04 MED ORDER — DOCUSATE SODIUM 100 MG PO CAPS
100.0000 mg | ORAL_CAPSULE | Freq: Two times a day (BID) | ORAL | Status: DC
  Administered 2014-05-04 – 2014-05-06 (×5): 100 mg via ORAL
  Filled 2014-05-04 (×6): qty 1

## 2014-05-04 MED ORDER — DULOXETINE HCL 60 MG PO CPEP
60.0000 mg | ORAL_CAPSULE | Freq: Every day | ORAL | Status: DC
  Administered 2014-05-04 – 2014-05-06 (×3): 60 mg via ORAL
  Filled 2014-05-04 (×3): qty 1

## 2014-05-04 MED ORDER — ALBUTEROL SULFATE (2.5 MG/3ML) 0.083% IN NEBU: 2.5000 mg | INHALATION_SOLUTION | Freq: Four times a day (QID) | RESPIRATORY_TRACT | Status: DC | PRN

## 2014-05-04 MED ORDER — ACETAMINOPHEN 325 MG PO TABS: 650.0000 mg | ORAL_TABLET | ORAL | Status: DC | PRN

## 2014-05-04 MED ORDER — OXYCODONE HCL 5 MG PO TABS
5.0000 mg | ORAL_TABLET | Freq: Four times a day (QID) | ORAL | Status: DC | PRN
  Administered 2014-05-04 – 2014-05-06 (×4): 5 mg via ORAL
  Filled 2014-05-04 (×4): qty 1

## 2014-05-04 MED ORDER — MEMANTINE HCL ER 28 MG PO CP24
28.0000 mg | ORAL_CAPSULE | Freq: Every day | ORAL | Status: DC
  Administered 2014-05-04 – 2014-05-06 (×3): 28 mg via ORAL
  Filled 2014-05-04 (×3): qty 28

## 2014-05-04 MED ORDER — FENTANYL CITRATE 0.05 MG/ML IJ SOLN
50.0000 ug | Freq: Once | INTRAMUSCULAR | Status: AC
  Administered 2014-05-04: 50 ug via INTRAVENOUS
  Filled 2014-05-04: qty 2

## 2014-05-04 MED ORDER — LORAZEPAM 0.5 MG PO TABS: 0.5000 mg | ORAL_TABLET | Freq: Four times a day (QID) | ORAL | Status: DC | PRN

## 2014-05-04 MED ORDER — MEMANTINE HCL 5 MG PO TABS: 28.0000 mg | ORAL_TABLET | Freq: Every day | ORAL | Status: DC

## 2014-05-04 MED ORDER — LIDOCAINE HCL 1 % IJ SOLN
INTRAMUSCULAR | Status: AC
  Filled 2014-05-04: qty 20

## 2014-05-04 MED ORDER — PANTOPRAZOLE SODIUM 40 MG PO TBEC
40.0000 mg | DELAYED_RELEASE_TABLET | Freq: Every day | ORAL | Status: DC
  Administered 2014-05-04 – 2014-05-06 (×3): 40 mg via ORAL
  Filled 2014-05-04 (×3): qty 1

## 2014-05-04 MED ORDER — FENTANYL CITRATE 0.05 MG/ML IJ SOLN: 50.0000 ug | INTRAMUSCULAR | Status: DC | PRN

## 2014-05-04 NOTE — ED Provider Notes (Signed)
Level V caveat dementia Patient suffered unwitnessed fall this morning while walking. Presently complains of left hip pain. Denies other complaint. Patient is alert, pleasant, cooperative. HEENT exam normocephalic atraumatic neck supple trachea midline nontender. Lungs clear auscultation abdomen soft nontender. Pelvis tender left hip. Of lower extremity mild pain on internal rotation 5. No deformity. DP pulse 2+ femoral pulses 2+. All extremities no contusion abrasion or tenderness neurovascular intact. Neurologic alert moves all extremities cranial nerves II through XII grossly intact follows simple commands  Orlie Dakin, MD 05/04/14 [phone]

## 2014-05-04 NOTE — Progress Notes (Signed)
Nutrition Brief Note  Patient identified on the Malnutrition Screening Tool (MST) Report  Wt Readings from Last 5 Encounters:  05/04/14 138 lb (62.596 kg)  03/16/14 148 lb 6.4 oz (67.314 kg)  11/24/13 147 lb (66.679 kg)  09/15/13 146 lb 6.4 oz (66.407 kg)  08/15/13 145 lb (65.772 kg)    Body mass index is 20.99 kg/(m^2). Patient meets criteria for normal weight based on current BMI.   Current diet order is heart healthy, patient was waiting on lunch tray during RD visit. Labs and medications reviewed. Pt with hx of dementia, colon cancer, PE, depression, comes in from SNF following an unwitnessed fall. Pt alone in room. Per telephone conversation with SNF staff, pt was on a regular diet with thin liquids and typically ate 50-75% of her meals. Not on any nutritional supplements. Although weight down 10 pounds, staff reports pt generally was eating good at mealtimes. Expect pt will eat well during admission.    No nutrition interventions warranted at this time. If nutrition issues arise, please consult RD.   Carlis Stable MS, [place], LDN [phone] Pager [phone] Weekend/After Hours Pager

## 2014-05-04 NOTE — ED Notes (Signed)
Patient transported to X-ray 

## 2014-05-04 NOTE — ED Provider Notes (Signed)
8:25 AM pain improved after treatment with intravenous fentanyl. X-rays viewed by me.  Orlie Dakin, MD 05/04/14 [phone]

## 2014-05-04 NOTE — ED Provider Notes (Signed)
Medical screening examination/treatment/procedure(s) were conducted as a shared visit with non-physician practitioner(s) and myself.  I personally evaluated the patient during the encounter.   EKG Interpretation   Date/Time:  Friday May 04 2014 07:56:07 EDT Ventricular Rate:  110 PR Interval:    QRS Duration: 102 QT Interval:  362 QTC Calculation: 490 R Axis:   10 Text Interpretation:  Atrial fibrillation Borderline prolonged QT interval  Atrial fibrillation New since previous tracing Confirmed by Winfred Leeds   MD, Jadah Bobak [phone]) on 05/04/2014 8:01:36 AM       Orlie Dakin, MD 05/04/14 2993

## 2014-05-04 NOTE — ED Notes (Signed)
Patient transported to CT 

## 2014-05-04 NOTE — H&P (Signed)
History and Physical    Erin Hansen WFU:[id number] DOB: 1926-07-01 DOA: 05/04/2014  Referring physician: Dr. Winfred Leeds  PCP: Estill Dooms, MD  Specialists: orthopedic surgery   Chief Complaint: fall  HPI: Erin Hansen is a 80 y.o. female has a past medical history significant for dementia, colon cancer, PE, depression, comes in from SNF following an un witnessed fall. Patient is has underlying dementia and although alert and pleasant cannot contribute anything to the story. Per SNF report she was found on the ground on her left side and patient was complaining of left hip pain at that time. In the ED she underwent a CT pelvis which showed left acetabulum fracture and associated hemorrhage in her left pelvis. Orthopedic surgery consulted and [facility] asked for admission. FOr me patient has no complaints other than her left hip hurting.  Review of Systems: unable to obtain ROS  Past Medical History  Diagnosis Date  . Compression fracture     t12  . IBS (irritable bowel syndrome)   . Vaginal prolapse   . TMJ (temporomandibular joint disorder)   . Anemia   . Hearing loss   . Blood in stool   . Anxiety   . Unspecified essential hypertension 11/18/2012  . Coronary atherosclerosis of native coronary artery 10/25/2012  . Long term (current) use of anticoagulants 10/25/2012  . Other pulmonary embolism and infarction 10/15/2012  . Phlebitis and thrombophlebitis of other deep vessels of lower extremities 10/15/2012  . 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 Vitamin D deficiency 09/27/2012  . Hypopotassemia 09/27/2012  . Major depressive disorder, single episode, unspecified 09/27/2012  . Blepharitis, unspecified 09/27/2012  . Diffuse cystic mastopathy 09/27/2012  . Muscle weakness (generalized) 09/27/2012  . Abnormal involuntary movements(781.0) 09/27/2012  . Edema 09/27/2012  . Unspecified urinary incontinence 09/27/2012  . Colostomy status 09/27/2012  . Cancer    Colon  . Malignant neoplasm of colon, unspecified site 09/27/2012  . DJD (degenerative joint disease)   . Degeneration of intervertebral disc, site unspecified 09/27/2012   Past Surgical History  Procedure Laterality Date  . Appendectomy    . Vaginal prolapse repair  2005 - approximate  . Cystectomy      back  . Nasal reconstruction with septal repair  1983  . Skin cancer excision    . Arthroscopy left knee  2007  . Colonoscopy    . Total abdominal hysterectomy  1978  . Hernia repair  1980 - approximate  . Eye surgery  [phone]    cataracts bilateral  . Back surgery      mid back  . Laparoscopic sigmoid colectomy  09/20/2012    Procedure: LAPAROSCOPIC SIGMOID COLECTOMY;  Surgeon: Rolm Bookbinder, MD;  Location: [facility];  Service: General;  Laterality: N/A;  Laparoscopic possible Open Sigmoid Colectomy,Colostomy  . Colostomy  09/20/2012    Procedure: COLOSTOMY;  Surgeon: Rolm Bookbinder, MD;  Location: [facility];  Service: General;  Laterality: N/A;   Social History:  reports that she has never smoked. She has never used smokeless tobacco. She reports that she does not drink alcohol or use illicit drugs.  Allergies  Allergen Reactions  . Codeine Nausea Only    Family History  Problem Relation Age of Onset  . Cancer Son     spine  . Cancer Son     Prior to Admission medications   Medication Sig Start Date End Date Taking? Authorizing Provider  docusate sodium (COLACE) 100 MG capsule  Take 100 mg by mouth 2 (two) times daily.   Yes Historical Provider, MD  metoprolol tartrate (LOPRESSOR) 25 MG tablet Take 25 mg by mouth 2 (two) times daily.   Yes Historical Provider, MD  potassium chloride SA (K-DUR,KLOR-CON) 20 MEQ tablet Take 10 mEq by mouth daily.   Yes Historical Provider, MD  acetaminophen (TYLENOL) 500 MG tablet Take 1,000 mg by mouth every 4 (four) hours as needed for pain.    Historical Provider, MD  albuterol (PROVENTIL) (2.5 MG/3ML) 0.083% nebulizer solution Take 2.5  mg by nebulization. Use one via nebulizer every 6 hours a needed for SOB or wheezing    Historical Provider, MD  ARIPiprazole (ABILIFY) 10 MG tablet Take 10 mg by mouth daily.    Historical Provider, MD  benzonatate (TESSALON) 100 MG capsule Tale 1 capsule by mouth twice daily as needed for cough    Historical Provider, MD  DULoxetine (CYMBALTA) 60 MG capsule Take 60 mg by mouth daily.    Historical Provider, MD  ENULOSE 10 GM/15ML SOLN 40 g daily.  01/16/13   Historical Provider, MD  LORazepam (ATIVAN) 0.5 MG tablet Take 0.5 mg by mouth every 6 (six) hours as needed for anxiety.    Historical Provider, MD  memantine (NAMENDA) 10 MG tablet Take 28 mg by mouth daily.    Historical Provider, MD  omeprazole (PRILOSEC) 20 MG capsule Take 20 mg by mouth daily. Take one daily 08/14/13   Historical Provider, MD  Rivaroxaban (XARELTO) 20 MG TABS tablet Take 20 mg by mouth daily.    Historical Provider, MD   Physical Exam: Filed Vitals:   05/04/14 5427 05/04/14 0817 05/04/14 0830 05/04/14 0848  BP:   167/90   Pulse: 96 105  116  Temp:      TempSrc:      Resp: 11 18 20 12   Height:      Weight:      SpO2: 95% 94%  93%     General:  NAD, confused  Eyes: no scleral icterus  Cardiovascular: irregular   Respiratory: CTA biL, good air movement without wheezing, rhonchi or crackled  Abdomen: soft, non tender to palpation, positive bowel sounds, colostomy bag in place  Skin: no rashes  Musculoskeletal: no peripheral edema  Neurologic: non focal  Labs on Admission:  Basic Metabolic Panel:  Recent Labs Lab 05/04/14 0715  NA 139  K 3.8  CL 101  CO2 27  GLUCOSE 118*  BUN 19  CREATININE 0.72  CALCIUM 9.2   CBC:  Recent Labs Lab 05/04/14 0715  WBC 10.9*  NEUTROABS 8.0*  HGB 11.4*  HCT 34.4*  MCV 89.6  PLT 235   Radiological Exams on Admission: Dg Chest 1 View  05/04/2014   CLINICAL DATA:  Preop, fracture pelvis  EXAM: CHEST - 1 VIEW  COMPARISON:  10/08/2012  FINDINGS:  Cardiac silhouette upper normal. Vascular pattern normal. No pneumothorax consolidation or effusion.  IMPRESSION: No active disease.   Electronically Signed   By: Skipper Cliche M.D.   On: 05/04/2014 07:53   Dg Hip Complete Left  05/04/2014   CLINICAL DATA:  [place] this morning with left hip and leg pain  EXAM: LEFT HIP - COMPLETE 2+ VIEW  COMPARISON:  None.  FINDINGS: There is destruction of the superior medial and inferior surfaces of the left acetabulum. There is a bone fragment displaced 17 mm medially from the left acetabulum. Comminuted fracture line extends to involve superior pubic ramus laterally. On the frontal view  there is an oblique linear lucency projecting over the sub trochanteric femur which appears continue with with soft tissue planes. The other images do not show evidence of femur fracture. No evidence of femur dislocation.  IMPRESSION: Left acetabular fracture.   Electronically Signed   By: Skipper Cliche M.D.   On: 05/04/2014 07:27   Ct Pelvis Wo Contrast  05/04/2014   CLINICAL DATA:  Further evaluation of the left pelvic/ acetabular fracture.  EXAM: CT PELVIS WITHOUT CONTRAST  TECHNIQUE: Multidetector CT imaging of the pelvis was performed following the standard protocol without intravenous contrast.  COMPARISON:  Current pelvis and left hip radiographs.  FINDINGS: There is a comminuted fracture of the left acetabulum. Fracture lines involve the anterior column and medial acetabular wall, but spare the posterior column. An oblique sagittal fracture extends across the anterior medial acetabular roof. Another fracture extends obliquely from the anteromedial and inferior acetabulum. Fractures converge at the medial aspect of the superior, posterior acetabulum. The fractured medial acetabular wall as displaced medially by approximately 9 mm.  Small bony fracture fragments lie along the inferior margin of the joint space below the left femoral head neck junction. There is a nondisplaced  fracture across the inferior acetabular rim.  Hemorrhage in edema from the fracture lies along the left lateral pelvis displacing the bladder to the right.  No other fractures. SI joints are normally spaced and aligned as is pubic symphysis. Hip joints are normally aligned. No proximal femur fracture. Bones are diffusely demineralized.  There is a hernia of the lower anterior abdominal wall along the midline with a base measuring 4 cm, containing small bowel without evidence of incarceration, strangulation or obstruction.  No pelvic masses.  No adenopathy.  There are significant degenerative changes of the visualized lower lumbar spine.  IMPRESSION: 1. Comminuted fracture of the left acetabulum. Fracture involves the medial acetabular wall and anterior column, but spares the posterior column. Fracture fragments are displaced medially by approximately 9 mm creating a protrusio acetabula. There is associated hemorrhage along the left margin of the pelvis displacing the bladder to the right.   Electronically Signed   By: Lajean Manes M.D.   On: 05/04/2014 08:08    EKG: Independently reviewed. A fib  Assessment/Plan Active Problems:   Colostomy status   Fall at nursing home   Left acetabular fracture   Acetabular fracture   Hip fracture   History of pulmonary embolism   History of colon cancer   #1 Left acetabular fracture - ortho surgery consulted, non operative management.  #2 History of DVT/PE - this was at the beginning of 2014, she recently saw Dr. Benay Spice and stopping the Xarelto was under consideration. Will hold Xarelto for now especially given recent fall and bleeding.  #3 Anemia - she is on Xarelto for history of PE, now with pelvic hemorrhage due to fall. Hold Xarelto. Monitor CBC #4 History of colon cancer - s/p surgery and chemotherapy, recently saw Dr. Benay Spice. #5 Dementia - continue home medications #6 A fib - appears to be new, rate control with Metoprolol, pain control, not a  candidate for anticoagulation currently.  #7 Goals of care: patient has durable DNR and most form  Diet: heart Fluids: none DVT Prophylaxis: SCD  Code Status: DNR  Family Communication: none  Disposition Plan: Telemetry admission  Time spent: 61  This note has been created with Surveyor, quantity. Any transcriptional errors are unintentional.   Huong Luthi M. Cruzita Lederer, MD [facility] Pager [phone]  If 7PM-7AM, please contact night-coverage [email or web address] Password [email or web address] 05/04/2014, 10:07 AM

## 2014-05-04 NOTE — ED Notes (Signed)
Pt returned from xray

## 2014-05-04 NOTE — ED Notes (Signed)
Pt returned from CT °

## 2014-05-04 NOTE — ED Notes (Signed)
Per [facility] - pt from [facility] - pt normally ambulates w/ a walker, fell this a.m. Onto her left side - pt c/o left hip and lower back pain. Pt denies any LOC or head injury. Pt currently taking xeralto. Pt A&Ox4, no acute distress on arrival.

## 2014-05-04 NOTE — Progress Notes (Signed)
Clinical Social Work Department BRIEF PSYCHOSOCIAL ASSESSMENT 05/04/2014  Patient:  Erin Hansen, Erin Hansen     Account Number:  [id number]     Admit date:  05/04/2014  Clinical Social Worker:  Renold Genta  Date/Time:  05/04/2014 02:52 PM  Referred by:  Physician  Date Referred:  05/04/2014 Referred for  SNF Placement   Other Referral:   Interview type:  Family Other interview type:   patient's son, Erin Hansen via phone    PSYCHOSOCIAL DATA Living Status:  FACILITY Admitted from facility:  [place] Level of care:  [place] Primary support name:  Erin Hansen (son) c#: [phone] Primary support relationship to patient:  CHILD, ADULT Degree of support available:   good    CURRENT CONCERNS Current Concerns  Post-Acute Placement   Other Concerns:    SOCIAL WORK ASSESSMENT / PLAN CSW received consult that patient was admitted from [facility].   Assessment/plan status:  Information/Referral to [employer] Other assessment/ plan:   Information/referral to community resources:   CSW completed FL2 and faxed information to [facility], confirmed with [place] @ SNF that they would be able to take patient back when ready.    PATIENT'S/FAMILY'S RESPONSE TO PLAN OF CARE: Patient's son informed CSW that he has been very pleased with the care his mother has been receiving at [place] that he plans for her to return to their SNF when discharged. Son states that he plans to come visit her this weekend sometime. CSW explained that patient is in the hospital under "observation" status due to patient not having surgery done and that she may be cleared to go over the weekend. Son & SNF aware, weekend CSW (ph#: [phone]) to facilitate discharge should patient be cleared over the weekend to return.       Raynaldo Opitz, [facility] Clinical Social Worker cell #: [phone]

## 2014-05-04 NOTE — ED Provider Notes (Signed)
CSN: [id number]     Arrival date & time 05/04/14  1937 History   First MD Initiated Contact with Patient 05/04/14 [phone]     Chief Complaint  Patient presents with  . Fall  . Back Pain  . Hip Pain     (Consider location/radiation/quality/duration/timing/severity/associated sxs/prior Treatment) HPI Comments: The patient is a 80 year old female with past medical history of hypertension, colon cancer, PE (currently taking Xarelto) PE,dementia, presenting to emergency room from [facility], with a chief complaint of unwitnessed fall today.  Per Alric Seton (overnight staff at the nursing home) the patient was found on the ground on her left side. Per the patient she reports having a sharp low back pain while walking to get her walker and fell on her left side. The patient normally ambulates with a walker at baseline.   The patient is a DNR, MOST form indicates Limited Additional Interventions, Jake Goodson (son) is POA [phone] (best contact number). PCP: Estill Dooms, MD  Level V caveat due to dementia   Patient is a 80 y.o. female presenting with fall, back pain, and hip pain. The history is provided by the patient, the EMS personnel and the nursing home. No language interpreter was used.  Fall  Back Pain Hip Pain    Past Medical History  Diagnosis Date  . DJD (degenerative joint disease)   . Compression fracture     t12  . IBS (irritable bowel syndrome)   . Vaginal prolapse   . TMJ (temporomandibular joint disorder)   . Anemia   . Hearing loss   . Blood in stool   . Anxiety   . Cancer     Colon  . Unspecified essential hypertension 11/18/2012  . Coronary atherosclerosis of native coronary artery 10/25/2012  . Long term (current) use of anticoagulants 10/25/2012  . Other pulmonary embolism and infarction 10/15/2012  . Phlebitis and thrombophlebitis of other deep vessels of lower extremities 10/15/2012  . Malignant neoplasm of colon, unspecified site  09/27/2012  . 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 Vitamin D deficiency 09/27/2012  . Hypopotassemia 09/27/2012  . Major depressive disorder, single episode, unspecified 09/27/2012  . Blepharitis, unspecified 09/27/2012  . Diffuse cystic mastopathy 09/27/2012  . Degeneration of intervertebral disc, site unspecified 09/27/2012  . Muscle weakness (generalized) 09/27/2012  . Abnormal involuntary movements(781.0) 09/27/2012  . Edema 09/27/2012  . Unspecified urinary incontinence 09/27/2012  . Colostomy status 09/27/2012   Past Surgical History  Procedure Laterality Date  . Appendectomy    . Vaginal prolapse repair  2005 - approximate  . Cystectomy      back  . Nasal reconstruction with septal repair  1983  . Skin cancer excision    . Arthroscopy left knee  2007  . Colonoscopy    . Total abdominal hysterectomy  1978  . Hernia repair  1980 - approximate  . Eye surgery  [phone]    cataracts bilateral  . Back surgery      mid back  . Laparoscopic sigmoid colectomy  09/20/2012    Procedure: LAPAROSCOPIC SIGMOID COLECTOMY;  Surgeon: Rolm Bookbinder, MD;  Location: [facility];  Service: General;  Laterality: N/A;  Laparoscopic possible Open Sigmoid Colectomy,Colostomy  . Colostomy  09/20/2012    Procedure: COLOSTOMY;  Surgeon: Rolm Bookbinder, MD;  Location: [facility];  Service: General;  Laterality: N/A;   Family History  Problem Relation Age of Onset  . Cancer Son     spine  .  Cancer Son    History  Substance Use Topics  . Smoking status: Never Smoker   . Smokeless tobacco: Never Used  . Alcohol Use: No   OB History   Grav Para Term Preterm Abortions TAB SAB Ect Mult Living                 Review of Systems  Unable to perform ROS: Dementia      Allergies  Codeine  Home Medications   Prior to Admission medications   Medication Sig Start Date End Date Taking? Authorizing Provider  acetaminophen (TYLENOL) 500 MG tablet Take 1,000 mg by mouth  every 4 (four) hours as needed for pain.    Historical Provider, MD  albuterol (PROVENTIL) (2.5 MG/3ML) 0.083% nebulizer solution Take 2.5 mg by nebulization. Use one via nebulizer every 6 hours a needed for SOB or wheezing    Historical Provider, MD  ARIPiprazole (ABILIFY) 10 MG tablet Take 10 mg by mouth daily.    Historical Provider, MD  benzonatate (TESSALON) 100 MG capsule Tale 1 capsule by mouth twice daily as needed for cough    Historical Provider, MD  docusate sodium (COLACE) 100 MG capsule Take 1 capsule (100 mg total) by mouth 2 (two) times daily. 02/20/14   Man Mast X, NP  DULoxetine (CYMBALTA) 60 MG capsule Take 60 mg by mouth daily.    Historical Provider, MD  ENULOSE 10 GM/15ML SOLN 40 g daily.  01/16/13   Historical Provider, MD  feeding supplement (ENSURE COMPLETE) LIQD Take 237 mLs by mouth 2 (two) times daily between meals. 10/14/12   Geradine Girt, DO  LORazepam (ATIVAN) 0.5 MG tablet Take 0.5 mg by mouth every 6 (six) hours as needed for anxiety.    Historical Provider, MD  memantine (NAMENDA) 10 MG tablet Take 28 mg by mouth daily.    Historical Provider, MD  metoprolol tartrate (LOPRESSOR) 25 MG tablet Take 1 tablet (25 mg total) by mouth 2 (two) times daily. 10/14/12   Geradine Girt, DO  omeprazole (PRILOSEC) 20 MG capsule Take one daily 08/14/13   Historical Provider, MD  potassium chloride SA (K-DUR,KLOR-CON) 20 MEQ tablet Take 0.5 tablets (10 mEq total) by mouth daily. 02/09/14   Man Mast X, NP  Rivaroxaban (XARELTO) 20 MG TABS tablet Take 20 mg by mouth daily.    Historical Provider, MD   BP 185/69  Pulse 87  Temp(Src) 98.1 F (36.7 C) (Oral)  Resp 20  Ht 5\' 8"  (1.727 m)  Wt 138 lb (62.596 kg)  BMI 20.99 kg/m2  SpO2 98% Physical Exam  Nursing note and vitals reviewed. Constitutional: She appears well-developed and well-nourished. No distress.  HENT:  Head: Normocephalic and atraumatic.  Mouth/Throat: Mucous membranes are dry.  Eyes: EOM are normal. Pupils are equal,  round, and reactive to light.  Neck: Neck supple.  Cardiovascular: Normal rate.  An irregularly irregular rhythm present.  Pulses:      Dorsalis pedis pulses are 1+ on the right side, and 1+ on the left side.  Pulmonary/Chest: Effort normal. No respiratory distress. She has no wheezes.  Abdominal: Soft. There is no tenderness. There is no rebound.  Left sided abdominal wall colostomy site non-tender.  Moderate amount of brown stool in bag.  Musculoskeletal:       Left hip: She exhibits decreased range of motion.  Decrease ROM secondary to pain.  Left leg is shortened. No External rotation seen on exam.  Neurological: She is alert. She is disoriented.  She displays tremor. No sensory deficit. GCS eye subscore is 4. GCS verbal subscore is 5. GCS motor subscore is 6.  Disoriented to time, patient gives inappropriate answers to questions.  Skin: Skin is warm and dry. She is not diaphoretic.  Psychiatric: She has a normal mood and affect. Her behavior is normal.    ED Course  Procedures (including critical care time) Labs Review Labs Reviewed  CBC WITH DIFFERENTIAL - Abnormal; Notable for the following:    WBC 10.9 (*)    RBC 3.84 (*)    Hemoglobin 11.4 (*)    HCT 34.4 (*)    Neutro Abs 8.0 (*)    All other components within normal limits  BASIC METABOLIC PANEL - Abnormal; Notable for the following:    Glucose, Bld 118 (*)    GFR calc non Af Amer 76 (*)    GFR calc Af Amer 88 (*)    All other components within normal limits  URINALYSIS, ROUTINE W REFLEX MICROSCOPIC - Abnormal; Notable for the following:    APPearance CLOUDY (*)    Leukocytes, UA SMALL (*)    All other components within normal limits  URINE MICROSCOPIC-ADD ON - Abnormal; Notable for the following:    Bacteria, UA MANY (*)    All other components within normal limits  URINE CULTURE    Imaging Review Dg Chest 1 View  05/04/2014   CLINICAL DATA:  Preop, fracture pelvis  EXAM: CHEST - 1 VIEW  COMPARISON:  10/08/2012   FINDINGS: Cardiac silhouette upper normal. Vascular pattern normal. No pneumothorax consolidation or effusion.  IMPRESSION: No active disease.   Electronically Signed   By: Skipper Cliche M.D.   On: 05/04/2014 07:53   Dg Hip Complete Left  05/04/2014   CLINICAL DATA:  [place] this morning with left hip and leg pain  EXAM: LEFT HIP - COMPLETE 2+ VIEW  COMPARISON:  None.  FINDINGS: There is destruction of the superior medial and inferior surfaces of the left acetabulum. There is a bone fragment displaced 17 mm medially from the left acetabulum. Comminuted fracture line extends to involve superior pubic ramus laterally. On the frontal view there is an oblique linear lucency projecting over the sub trochanteric femur which appears continue with with soft tissue planes. The other images do not show evidence of femur fracture. No evidence of femur dislocation.  IMPRESSION: Left acetabular fracture.   Electronically Signed   By: Skipper Cliche M.D.   On: 05/04/2014 07:27   Ct Pelvis Wo Contrast  05/04/2014   CLINICAL DATA:  Further evaluation of the left pelvic/ acetabular fracture.  EXAM: CT PELVIS WITHOUT CONTRAST  TECHNIQUE: Multidetector CT imaging of the pelvis was performed following the standard protocol without intravenous contrast.  COMPARISON:  Current pelvis and left hip radiographs.  FINDINGS: There is a comminuted fracture of the left acetabulum. Fracture lines involve the anterior column and medial acetabular wall, but spare the posterior column. An oblique sagittal fracture extends across the anterior medial acetabular roof. Another fracture extends obliquely from the anteromedial and inferior acetabulum. Fractures converge at the medial aspect of the superior, posterior acetabulum. The fractured medial acetabular wall as displaced medially by approximately 9 mm.  Small bony fracture fragments lie along the inferior margin of the joint space below the left femoral head neck junction. There is a  nondisplaced fracture across the inferior acetabular rim.  Hemorrhage in edema from the fracture lies along the left lateral pelvis displacing the bladder to the right.  No other fractures. SI joints are normally spaced and aligned as is pubic symphysis. Hip joints are normally aligned. No proximal femur fracture. Bones are diffusely demineralized.  There is a hernia of the lower anterior abdominal wall along the midline with a base measuring 4 cm, containing small bowel without evidence of incarceration, strangulation or obstruction.  No pelvic masses.  No adenopathy.  There are significant degenerative changes of the visualized lower lumbar spine.  IMPRESSION: 1. Comminuted fracture of the left acetabulum. Fracture involves the medial acetabular wall and anterior column, but spares the posterior column. Fracture fragments are displaced medially by approximately 9 mm creating a protrusio acetabula. There is associated hemorrhage along the left margin of the pelvis displacing the bladder to the right.   Electronically Signed   By: Lajean Manes M.D.   On: 05/04/2014 08:08     EKG Interpretation   Date/Time:  Friday May 04 2014 07:56:07 EDT Ventricular Rate:  110 PR Interval:    QRS Duration: 102 QT Interval:  362 QTC Calculation: 490 R Axis:   10 Text Interpretation:  Atrial fibrillation Borderline prolonged QT interval  Atrial fibrillation New since previous tracing Confirmed by JACUBOWITZ   MD, SAM [phone]) on 05/04/2014 8:01:36 AM      MDM   Final diagnoses:  Acetabular fracture, left, closed, initial encounter  Atrial fibrillation, unspecified  Fall, initial encounter  UTI (lower urinary tract infection)   Pt sent from nursing home due to un witnessed fall.  On exam the patient has decrease ROM to left leg and shortened. Concerning for fracture, pt has a history of dementia. XR shows Left acetabular fracture, CT ordered. Discussed pt condition with pt's son, agrees to surgery if  necessary. CBC shoes slight leukocytosis, Hbg 11.4 stable from previous.  BMP without concerning abnormalities.EKG shows A-fib, new diagnosis of A-fib pt is on Xaeralto. UA shows infection, culture sent, ceftriaxone given. Dr. Erlinda Hong requests paged Dr. Marcelino Scot due to acetabular fracture. Dr. Marcelino Scot advises serial cbc and pain control, inoperative Fx, will follow up as Out-pt, Dr. Erlinda Hong to evaluate while in the hospital. Dr. Cruzita Lederer to admit the patient.   Harvie Heck, PA-C 05/04/14 1615

## 2014-05-04 NOTE — ED Notes (Signed)
Bed: JQ49 Expected date: 05/04/14 Expected time: 6:33 AM Means of arrival: Ambulance Comments: Fall, lower back pain

## 2014-05-04 NOTE — ED Notes (Signed)
MD at bedside. 

## 2014-05-04 NOTE — Consult Note (Signed)
Orthopaedic Trauma Service Consultation  Reason for Consult: Left acetabular fracture, anterior column posterior hemi-transverse (involving both columns) Referring Physician: Orlie Dakin, MD  Erin Hansen is an 80 y.o. female.  HPI: 80 yo demented female with unwitnessed ground level fall, inability to get up, c/o pain left hip.  Plain films and xray show comminuted acetabular fracture with slight mild protrusio. Worse with motion and loading. Denies tingling, numbness, or other injury.   Past Medical History  Diagnosis Date  . Compression fracture     t12  . IBS (irritable bowel syndrome)   . Vaginal prolapse   . TMJ (temporomandibular joint disorder)   . Anemia   . Hearing loss   . Blood in stool   . Anxiety   . Unspecified essential hypertension 11/18/2012  . Coronary atherosclerosis of native coronary artery 10/25/2012  . Long term (current) use of anticoagulants 10/25/2012  . Other pulmonary embolism and infarction 10/15/2012  . Phlebitis and thrombophlebitis of other deep vessels of lower extremities 10/15/2012  . 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 Vitamin D deficiency 09/27/2012  . Hypopotassemia 09/27/2012  . Major depressive disorder, single episode, unspecified 09/27/2012  . Blepharitis, unspecified 09/27/2012  . Diffuse cystic mastopathy 09/27/2012  . Muscle weakness (generalized) 09/27/2012  . Abnormal involuntary movements(781.0) 09/27/2012  . Edema 09/27/2012  . Unspecified urinary incontinence 09/27/2012  . Colostomy status 09/27/2012  . Cancer     Colon  . Malignant neoplasm of colon, unspecified site 09/27/2012  . DJD (degenerative joint disease)   . Degeneration of intervertebral disc, site unspecified 09/27/2012    Past Surgical History  Procedure Laterality Date  . Appendectomy    . Vaginal prolapse repair  2005 - approximate  . Cystectomy      back  . Nasal reconstruction with septal repair  1983  . Skin cancer excision     . Arthroscopy left knee  2007  . Colonoscopy    . Total abdominal hysterectomy  1978  . Hernia repair  1980 - approximate  . Eye surgery  [phone]    cataracts bilateral  . Back surgery      mid back  . Laparoscopic sigmoid colectomy  09/20/2012    Procedure: LAPAROSCOPIC SIGMOID COLECTOMY;  Surgeon: Rolm Bookbinder, MD;  Location: [facility];  Service: General;  Laterality: N/A;  Laparoscopic possible Open Sigmoid Colectomy,Colostomy  . Colostomy  09/20/2012    Procedure: COLOSTOMY;  Surgeon: Rolm Bookbinder, MD;  Location: [facility];  Service: General;  Laterality: N/A;    Family History  Problem Relation Age of Onset  . Cancer Son     spine  . Cancer Son     Social History:  reports that she has never smoked. She has never used smokeless tobacco. She reports that she does not drink alcohol or use illicit drugs.  Allergies:  Allergies  Allergen Reactions  . Codeine Nausea Only    Medications: I have reviewed the patient's current medications.  Results for orders placed during the hospital encounter of 05/04/14 (from the past 48 hour(s))  CBC WITH DIFFERENTIAL     Status: Abnormal   Collection Time    05/04/14  7:15 AM      Result Value Ref Range   WBC 10.9 (*) 4.0 - 10.5 K/uL   RBC 3.84 (*) 3.87 - 5.11 MIL/uL   Hemoglobin 11.4 (*) 12.0 - 15.0 g/dL   HCT 34.4 (*) 36.0 - 46.0 %   MCV 89.6  78.0 - 100.0 fL   MCH 29.7  26.0 - 34.0 pg   MCHC 33.1  30.0 - 36.0 g/dL   RDW 12.8  11.5 - 15.5 %   Platelets 235  150 - 400 K/uL   Neutrophils Relative % 74  43 - 77 %   Neutro Abs 8.0 (*) 1.7 - 7.7 K/uL   Lymphocytes Relative 19  12 - 46 %   Lymphs Abs 2.1  0.7 - 4.0 K/uL   Monocytes Relative 7  3 - 12 %   Monocytes Absolute 0.7  0.1 - 1.0 K/uL   Eosinophils Relative 0  0 - 5 %   Eosinophils Absolute 0.0  0.0 - 0.7 K/uL   Basophils Relative 0  0 - 1 %   Basophils Absolute 0.0  0.0 - 0.1 K/uL  BASIC METABOLIC PANEL     Status: Abnormal   Collection Time    05/04/14  7:15 AM       Result Value Ref Range   Sodium 139  137 - 147 mEq/L   Potassium 3.8  3.7 - 5.3 mEq/L   Chloride 101  96 - 112 mEq/L   CO2 27  19 - 32 mEq/L   Glucose, Bld 118 (*) 70 - 99 mg/dL   BUN 19  6 - 23 mg/dL   Creatinine, Ser 0.72  0.50 - 1.10 mg/dL   Calcium 9.2  8.4 - 10.5 mg/dL   GFR calc non Af Amer 76 (*) >90 mL/min   GFR calc Af Amer 88 (*) >90 mL/min   Comment: (NOTE)     The eGFR has been calculated using the CKD EPI equation.     This calculation has not been validated in all clinical situations.     eGFR's persistently <90 mL/min signify possible Chronic Kidney     Disease.   Anion gap 11  5 - 15    Dg Chest 1 View  05/04/2014   CLINICAL DATA:  Preop, fracture pelvis  EXAM: CHEST - 1 VIEW  COMPARISON:  10/08/2012  FINDINGS: Cardiac silhouette upper normal. Vascular pattern normal. No pneumothorax consolidation or effusion.  IMPRESSION: No active disease.   Electronically Signed   By: Skipper Cliche M.D.   On: 05/04/2014 07:53   Dg Hip Complete Left  05/04/2014   CLINICAL DATA:  [place] this morning with left hip and leg pain  EXAM: LEFT HIP - COMPLETE 2+ VIEW  COMPARISON:  None.  FINDINGS: There is destruction of the superior medial and inferior surfaces of the left acetabulum. There is a bone fragment displaced 17 mm medially from the left acetabulum. Comminuted fracture line extends to involve superior pubic ramus laterally. On the frontal view there is an oblique linear lucency projecting over the sub trochanteric femur which appears continue with with soft tissue planes. The other images do not show evidence of femur fracture. No evidence of femur dislocation.  IMPRESSION: Left acetabular fracture.   Electronically Signed   By: Skipper Cliche M.D.   On: 05/04/2014 07:27   Ct Pelvis Wo Contrast  05/04/2014   CLINICAL DATA:  Further evaluation of the left pelvic/ acetabular fracture.  EXAM: CT PELVIS WITHOUT CONTRAST  TECHNIQUE: Multidetector CT imaging of the pelvis was performed  following the standard protocol without intravenous contrast.  COMPARISON:  Current pelvis and left hip radiographs.  FINDINGS: There is a comminuted fracture of the left acetabulum. Fracture lines involve the anterior column and medial acetabular wall, but spare the posterior column.  An oblique sagittal fracture extends across the anterior medial acetabular roof. Another fracture extends obliquely from the anteromedial and inferior acetabulum. Fractures converge at the medial aspect of the superior, posterior acetabulum. The fractured medial acetabular wall as displaced medially by approximately 9 mm.  Small bony fracture fragments lie along the inferior margin of the joint space below the left femoral head neck junction. There is a nondisplaced fracture across the inferior acetabular rim.  Hemorrhage in edema from the fracture lies along the left lateral pelvis displacing the bladder to the right.  No other fractures. SI joints are normally spaced and aligned as is pubic symphysis. Hip joints are normally aligned. No proximal femur fracture. Bones are diffusely demineralized.  There is a hernia of the lower anterior abdominal wall along the midline with a base measuring 4 cm, containing small bowel without evidence of incarceration, strangulation or obstruction.  No pelvic masses.  No adenopathy.  There are significant degenerative changes of the visualized lower lumbar spine.  IMPRESSION: 1. Comminuted fracture of the left acetabulum. Fracture involves the medial acetabular wall and anterior column, but spares the posterior column. Fracture fragments are displaced medially by approximately 9 mm creating a protrusio acetabula. There is associated hemorrhage along the left margin of the pelvis displacing the bladder to the right.   Electronically Signed   By: Lajean Manes M.D.   On: 05/04/2014 08:08    ROS multiple systems as noted above, now UTI per ED PAC Blood pressure 167/90, pulse 116, temperature 98.1 F  (36.7 C), temperature source Oral, resp. rate 12, height 5' 8"  (1.727 m), weight 138 lb (62.596 kg), SpO2 93.00%. Physical Exam Alert and pleasant but disoriented. UEx shoulder, elbow, wrist, digits- no skin wounds, nontender, no instability, no blocks to motion  Sens  Ax/R/M/U intact  Mot   Ax/ R/ PIN/ M/ AIN/ U intact  Rad 2+ UEx shoulder, elbow, wrist, digits- no skin wounds, nontender, no instability, no blocks to motion  Sens  Ax/R/M/U intact  Mot   Ax/ R/ PIN/ M/ AIN/ U intact  Rad 2+ Pelvis--no traumatic wounds or rash, no ecchymosis, stable to manual stress, tender left hip LLE No traumatic wounds, ecchymosis, or rash  Tender with palp of hip and attempted ROM which is decreased  No effusions  Knee grossly stable to varus/ valgus stress  Sens DPN, SPN, TN intact  Motor EHL, ext, flex, evers 5/5  DP 2+, PT 2+, No significant edema RLE No traumatic wounds, ecchymosis, or rash  Nontender  No effusions  Knee grossly stable to varus/ valgus stress  Sens DPN, SPN, TN intact  Motor EHL, ext, flex, evers 5/5  DP 2+, PT 2+, No significant edema   Assessment/Plan: Left acetabulum fracture involving both columns.  PLAN:  1. Nonoperative treatment with bed to chair transfers for the next 8-12 weeks 2. PT/OT 3. Medicine admitting for pain control and H/H monitoring 4. Will see patient in office follow up in 2 weeks with new xrays  I spoke with son, Danniela Mcbrearty, and explained the plan for treatment, which he understood and agreed with.  He reports that his mother is largely bed to chair currently.  He intends to come to town on Sunday and is reachable by phone at [phone].  Altamese Richardson, MD [facility] [phone] (p)   05/04/2014  9:02 AM

## 2014-05-05 LAB — URINE CULTURE: Colony Count: >=100000

## 2014-05-05 LAB — COMPREHENSIVE METABOLIC PANEL
ALBUMIN: 2.7 g/dL — AB (ref 3.5–5.2)
ALK PHOS: 74 U/L (ref 39–117)
ALT: 8 U/L (ref 0–35)
ANION GAP: 9 (ref 5–15)
AST: 11 U/L (ref 0–37)
BUN: 16 mg/dL (ref 6–23)
CALCIUM: 9 mg/dL (ref 8.4–10.5)
CO2: 30 mEq/L (ref 19–32)
Chloride: 103 mEq/L (ref 96–112)
Creatinine, Ser: 0.81 mg/dL (ref 0.50–1.10)
GFR calc Af Amer: 74 mL/min — ABNORMAL LOW (ref >90)
GFR calc non Af Amer: 64 mL/min — ABNORMAL LOW (ref >90)
Glucose, Bld: 123 mg/dL — ABNORMAL HIGH (ref 70–99)
POTASSIUM: 4.3 meq/L (ref 3.7–5.3)
Sodium: 142 mEq/L (ref 137–147)
Total Bilirubin: 0.8 mg/dL (ref 0.3–1.2)
Total Protein: 5.8 g/dL — ABNORMAL LOW (ref 6.0–8.3)

## 2014-05-05 LAB — CBC
HCT: 30.2 % — ABNORMAL LOW (ref 36.0–46.0)
Hemoglobin: 9.6 g/dL — ABNORMAL LOW (ref 12.0–15.0)
MCH: 28.8 pg (ref 26.0–34.0)
MCHC: 31.8 g/dL (ref 30.0–36.0)
MCV: 90.7 fL (ref 78.0–100.0)
Platelets: 160 10*3/uL (ref 150–400)
RBC: 3.33 MIL/uL — ABNORMAL LOW (ref 3.87–5.11)
RDW: 12.9 % (ref 11.5–15.5)
WBC: 9 10*3/uL (ref 4.0–10.5)

## 2014-05-05 LAB — PHOSPHORUS: PHOSPHORUS: 3.5 mg/dL (ref 2.3–4.6)

## 2014-05-05 LAB — MAGNESIUM: MAGNESIUM: 1.7 mg/dL (ref 1.5–2.5)

## 2014-05-05 NOTE — Progress Notes (Signed)
PROGRESS NOTE  Erin Hansen TDD:[id number] DOB: [birth year] DOA: 05/04/2014 PCP: Estill Dooms, MD  Subjective/ 24 H Interval events - complains of back pain this morning coming "from food", continues to be very confused  Assessment/Plan: Left acetabular fracture - ortho surgery consulted, non operative management.  History of DVT/PE - this was at the beginning of 2014, she recently saw Dr. Benay Spice and stopping the Xarelto was under consideration. Will hold Xarelto for now especially given recent fall and bleeding. - hold Xarelto indefinitely  UTI - continue Ceftriaxone, cultures pending Anemia - she is on Xarelto for history of PE, now with pelvic hemorrhage due to fall. Hold Xarelto.  - CBC trending down this morning, repeat tomorrow History of colon cancer - s/p surgery and chemotherapy, recently saw Dr. Benay Spice.  Dementia - continue home medications  A fib - appears to be new, rate controlled, not a candidate for anticoagulation currently.  Goals of care: patient has durable DNR and most form   Diet: heart Fluids: none DVT Prophylaxis: SCDs  Code Status: DNR Family Communication: none  Disposition Plan: SNF when ready  Consultants:  Orthopedic surgery   Procedures:  None    Antibiotics  Anti-infectives   Start     Dose/Rate Route Frequency Ordered Stop   05/04/14 1015  cefTRIAXone (ROCEPHIN) 1 g in dextrose 5 % 50 mL IVPB     1 g 100 mL/hr over 30 Minutes Intravenous Every 24 hours 05/04/14 1004     05/04/14 1000  cefTRIAXone (ROCEPHIN) injection 1 g  Status:  Discontinued     1 g Intramuscular  Once 05/04/14 0956 05/04/14 1004     Antibiotics Given (last 72 hours)   Date/Time Action Medication Dose Rate   05/04/14 1259 Given   cefTRIAXone (ROCEPHIN) 1 g in dextrose 5 % 50 mL IVPB 1 g 100 mL/hr      Studies  Filed Vitals:   05/04/14 1300 05/04/14 1448 05/04/14 2148 05/05/14 0409  BP: 137/60  117/50 127/52  Pulse: 97  86 79  Temp:  97.9 F (36.6  C) 98.1 F (36.7 C) 98.7 F (37.1 C)  TempSrc:  Oral Oral Oral  Resp:  20 20 20   Height:      Weight:      SpO2:  96% 92% 92%   No intake or output data in the 24 hours ending 05/05/14 0933 Filed Weights   05/04/14 0655  Weight: 62.596 kg (138 lb)    Exam:  General:  NAD, confused  Cardiovascular: RRR  Respiratory: CTA biL  Abdomen: soft, non tender  Data Reviewed: Basic Metabolic Panel:  Recent Labs Lab 05/04/14 0715 05/05/14 0404  NA 139 142  K 3.8 4.3  CL 101 103  CO2 27 30  GLUCOSE 118* 123*  BUN 19 16  CREATININE 0.72 0.81  CALCIUM 9.2 9.0  MG  --  1.7  PHOS  --  3.5   Liver Function Tests:  Recent Labs Lab 05/05/14 0404  AST 11  ALT 8  ALKPHOS 74  BILITOT 0.8  PROT 5.8*  ALBUMIN 2.7*   CBC:  Recent Labs Lab 05/04/14 0715 05/05/14 0404  WBC 10.9* 9.0  NEUTROABS 8.0*  --   HGB 11.4* 9.6*  HCT 34.4* 30.2*  MCV 89.6 90.7  PLT 235 160   Studies: Dg Chest 1 View  05/04/2014   CLINICAL DATA:  Preop, fracture pelvis  EXAM: CHEST - 1 VIEW  COMPARISON:  10/08/2012  FINDINGS: Cardiac silhouette upper  normal. Vascular pattern normal. No pneumothorax consolidation or effusion.  IMPRESSION: No active disease.   Electronically Signed   By: Skipper Cliche M.D.   On: 05/04/2014 07:53   Dg Hip Complete Left  05/04/2014   CLINICAL DATA:  [place] this morning with left hip and leg pain  EXAM: LEFT HIP - COMPLETE 2+ VIEW  COMPARISON:  None.  FINDINGS: There is destruction of the superior medial and inferior surfaces of the left acetabulum. There is a bone fragment displaced 17 mm medially from the left acetabulum. Comminuted fracture line extends to involve superior pubic ramus laterally. On the frontal view there is an oblique linear lucency projecting over the sub trochanteric femur which appears continue with with soft tissue planes. The other images do not show evidence of femur fracture. No evidence of femur dislocation.  IMPRESSION: Left acetabular  fracture.   Electronically Signed   By: Skipper Cliche M.D.   On: 05/04/2014 07:27   Ct Pelvis Wo Contrast  05/04/2014   CLINICAL DATA:  Further evaluation of the left pelvic/ acetabular fracture.  EXAM: CT PELVIS WITHOUT CONTRAST  TECHNIQUE: Multidetector CT imaging of the pelvis was performed following the standard protocol without intravenous contrast.  COMPARISON:  Current pelvis and left hip radiographs.  FINDINGS: There is a comminuted fracture of the left acetabulum. Fracture lines involve the anterior column and medial acetabular wall, but spare the posterior column. An oblique sagittal fracture extends across the anterior medial acetabular roof. Another fracture extends obliquely from the anteromedial and inferior acetabulum. Fractures converge at the medial aspect of the superior, posterior acetabulum. The fractured medial acetabular wall as displaced medially by approximately 9 mm.  Small bony fracture fragments lie along the inferior margin of the joint space below the left femoral head neck junction. There is a nondisplaced fracture across the inferior acetabular rim.  Hemorrhage in edema from the fracture lies along the left lateral pelvis displacing the bladder to the right.  No other fractures. SI joints are normally spaced and aligned as is pubic symphysis. Hip joints are normally aligned. No proximal femur fracture. Bones are diffusely demineralized.  There is a hernia of the lower anterior abdominal wall along the midline with a base measuring 4 cm, containing small bowel without evidence of incarceration, strangulation or obstruction.  No pelvic masses.  No adenopathy.  There are significant degenerative changes of the visualized lower lumbar spine.  IMPRESSION: 1. Comminuted fracture of the left acetabulum. Fracture involves the medial acetabular wall and anterior column, but spares the posterior column. Fracture fragments are displaced medially by approximately 9 mm creating a protrusio  acetabula. There is associated hemorrhage along the left margin of the pelvis displacing the bladder to the right.   Electronically Signed   By: Lajean Manes M.D.   On: 05/04/2014 08:08    Scheduled Meds: . ARIPiprazole  10 mg Oral Daily  . cefTRIAXone (ROCEPHIN)  IV  1 g Intravenous Q24H  . docusate sodium  100 mg Oral BID  . DULoxetine  60 mg Oral Daily  . Memantine HCl ER  28 mg Oral Daily  . metoprolol tartrate  25 mg Oral BID  . pantoprazole  40 mg Oral Daily  . sodium chloride  3 mL Intravenous Q12H   Continuous Infusions:   Active Problems:   Colostomy status   Fall at nursing home   Left acetabular fracture   Acetabular fracture   Hip fracture   History of pulmonary embolism   History  of colon cancer   Time spent: 25  This note has been created with Surveyor, quantity. Any transcriptional errors are unintentional.   Marzetta Board, MD [facility] Pager [phone]. If 7 PM - 7 AM, please contact night-coverage at [email or web address], password [facility] 05/05/2014, 9:33 AM  LOS: 1 day

## 2014-05-05 NOTE — Progress Notes (Signed)
UR completed 

## 2014-05-06 DIAGNOSIS — F0390 Unspecified dementia without behavioral disturbance: Secondary | ICD-10-CM | POA: Diagnosis present

## 2014-05-06 DIAGNOSIS — Z7901 Long term (current) use of anticoagulants: Secondary | ICD-10-CM | POA: Diagnosis not present

## 2014-05-06 DIAGNOSIS — Y9210 Unspecified residential institution as the place of occurrence of the external cause: Secondary | ICD-10-CM | POA: Diagnosis present

## 2014-05-06 DIAGNOSIS — I4891 Unspecified atrial fibrillation: Secondary | ICD-10-CM | POA: Diagnosis present

## 2014-05-06 DIAGNOSIS — N390 Urinary tract infection, site not specified: Secondary | ICD-10-CM | POA: Diagnosis present

## 2014-05-06 DIAGNOSIS — W19XXXA Unspecified fall, initial encounter: Secondary | ICD-10-CM | POA: Diagnosis present

## 2014-05-06 DIAGNOSIS — D649 Anemia, unspecified: Secondary | ICD-10-CM | POA: Diagnosis present

## 2014-05-06 DIAGNOSIS — Z66 Do not resuscitate: Secondary | ICD-10-CM | POA: Diagnosis present

## 2014-05-06 DIAGNOSIS — Z933 Colostomy status: Secondary | ICD-10-CM | POA: Diagnosis not present

## 2014-05-06 DIAGNOSIS — Z86718 Personal history of other venous thrombosis and embolism: Secondary | ICD-10-CM | POA: Diagnosis not present

## 2014-05-06 DIAGNOSIS — Z85038 Personal history of other malignant neoplasm of large intestine: Secondary | ICD-10-CM | POA: Diagnosis not present

## 2014-05-06 DIAGNOSIS — S32409A Unspecified fracture of unspecified acetabulum, initial encounter for closed fracture: Secondary | ICD-10-CM | POA: Diagnosis present

## 2014-05-06 DIAGNOSIS — Z86711 Personal history of pulmonary embolism: Secondary | ICD-10-CM | POA: Diagnosis not present

## 2014-05-06 LAB — GLUCOSE, CAPILLARY: GLUCOSE-CAPILLARY: 156 mg/dL — AB (ref 70–99)

## 2014-05-06 LAB — CBC
HCT: 30.6 % — ABNORMAL LOW (ref 36.0–46.0)
HEMOGLOBIN: 10.1 g/dL — AB (ref 12.0–15.0)
MCH: 29.3 pg (ref 26.0–34.0)
MCHC: 33 g/dL (ref 30.0–36.0)
MCV: 88.7 fL (ref 78.0–100.0)
Platelets: 152 10*3/uL (ref 150–400)
RBC: 3.45 MIL/uL — AB (ref 3.87–5.11)
RDW: 12.6 % (ref 11.5–15.5)
WBC: 9.1 10*3/uL (ref 4.0–10.5)

## 2014-05-06 MED ORDER — METOPROLOL TARTRATE 12.5 MG HALF TABLET
37.5000 mg | ORAL_TABLET | Freq: Two times a day (BID) | ORAL | Status: DC
Start: 2014-05-06 — End: 2014-05-06
  Filled 2014-05-06: qty 1

## 2014-05-06 MED ORDER — METOPROLOL TARTRATE 25 MG PO TABS
25.0000 mg | ORAL_TABLET | Freq: Two times a day (BID) | ORAL | Status: DC
  Filled 2014-05-06: qty 1

## 2014-05-06 MED ORDER — METOPROLOL TARTRATE 12.5 MG HALF TABLET
12.5000 mg | ORAL_TABLET | Freq: Once | ORAL | Status: DC
  Filled 2014-05-06: qty 1

## 2014-05-06 MED ORDER — OXYCODONE HCL 5 MG PO TABS: 5.0000 mg | ORAL_TABLET | Freq: Four times a day (QID) | ORAL | Status: AC | PRN

## 2014-05-06 NOTE — Progress Notes (Signed)
Report called to [facility] and talked with Research officer, political party.

## 2014-05-06 NOTE — Progress Notes (Signed)
Clinical Social Work  MD called and reported he is holding DC and will alert CSW in a few hours if patient remains stable for DC. CSW updated SNF who is agreeable to accept patient whenever medically stable. CSW had already faxed over DC summary for SNF to review and will continue to follow.  Sindy Messing, LCSW (Weekend Coverage)

## 2014-05-06 NOTE — Progress Notes (Signed)
Clinical Social Work  MD reports patient can DC today. CSW spoke with SNF who has received DC summary and is agreeable to accept. RN to call report. CSW prepared DC packet with FL2, DNR, MOST form, hard scripts, and DC summary included. CSW informed son Shanon Brow) via phone of DC plans. Son thanked CSW for call and reports he will come to visit patient tomorrow. Son requests non-emergency ambulance to transport and reports no further needs. Transportation arranged.  CSW is signing off but available if needed.  Sindy Messing, LCSW (Weekend Coverage)

## 2014-05-06 NOTE — Discharge Summary (Signed)
Physician Discharge Summary  Erin Hansen FHL:[id number] DOB: [birth year] DOA: 05/04/2014  PCP: Estill Dooms, MD  Admit date: 05/04/2014 Discharge date: 05/06/2014  Time spent: 35 minutes  Recommendations for Outpatient Follow-up:  1. Per orthopedic surgery, continue bed to chair transfers for the next 8-12 weeks 2. Follow up with Dr. Marcelino Scot in 2 weeks 3. Follow up with PCP in 1 week  Recommendations for primary care physician for things to follow:  Repeat CBC  Discharge Diagnoses:  Active Problems:   Colostomy status   Fall at nursing home   Left acetabular fracture   Acetabular fracture   Hip fracture   History of pulmonary embolism   History of colon cancer   Discharge Condition: stable  Diet recommendation: as tolerated, regular   Filed Weights   05/04/14 0655  Weight: 62.596 kg (138 lb)    History of present illness:  Erin Hansen is a 80 y.o. female has a past medical history significant for dementia, colon cancer, PE, depression, comes in from SNF following an un witnessed fall. Patient is has underlying dementia and although alert and pleasant cannot contribute anything to the story. Per SNF report she was found on the ground on her left side and patient was complaining of left hip pain at that time. In the ED she underwent a CT pelvis which showed left acetabulum fracture and associated hemorrhage in her left pelvis. Orthopedic surgery consulted and [facility] asked for admission. FOr me patient has no complaints other than her left hip hurting.  Hospital Course:  Left acetabular fracture - ortho surgery consulted, non operative management, will have to be bed to chair transfer for the next 8-12 weeks per ortho. Dr. Marcelino Scot will have her follow up in his office in ~2 weeks and will repeat XRay at that time.  History of DVT/PE - this was at the beginning of 2014, she recently saw Dr. Benay Spice and stopping the Xarelto was under consideration. Give recent fall and pelvis  bleeding, will hold Xarelto. UTI - continue Ceftriaxone, cultures with strep viridans, likely contaminant. Initial UA not impressive with 2-6 WBC, negative Nitrates, patient afebrile and without significant leukocytosis.  Anemia - she is on Xarelto for history of PE, now with pelvic hemorrhage due to fall. Hold Xarelto.  - CBC trending down initially, stable on discharge - repeat a CBC in 3-4 days History of colon cancer - s/p surgery and chemotherapy, recently saw Dr. Benay Spice.  Dementia - continue home medications  A fib - appears to be new, rate controlled, not a candidate for anticoagulation currently.  Goals of care: patient has durable DNR and most form  Procedures:  None    Consultations:  Orthopedic surgery   Discharge Exam: Filed Vitals:   05/05/14 0409 05/05/14 1337 05/05/14 2058 05/06/14 0530  BP: 127/52 144/52 144/76 127/49  Pulse: 79 85 62 95  Temp: 98.7 F (37.1 C) 98.7 F (37.1 C) 98.4 F (36.9 C) 97.9 F (36.6 C)  TempSrc: Oral Oral Axillary Axillary  Resp: 20 20 20 22   Height:      Weight:      SpO2: 92% 91% 90% 91%    General: NAD Cardiovascular: irregular Respiratory: CTA biL  Discharge Instructions     Medication List    STOP taking these medications       XARELTO 20 MG Tabs tablet  Generic drug:  rivaroxaban      TAKE these medications       acetaminophen 500 MG  tablet  Commonly known as:  TYLENOL  Take 1,000 mg by mouth every 4 (four) hours as needed for pain.     ARIPiprazole 10 MG tablet  Commonly known as:  ABILIFY  Take 10 mg by mouth daily.     docusate sodium 100 MG capsule  Commonly known as:  COLACE  Take 100 mg by mouth 2 (two) times daily.     DULoxetine 60 MG capsule  Commonly known as:  CYMBALTA  Take 60 mg by mouth daily.     ENULOSE 10 GM/15ML Soln  Generic drug:  lactulose (encephalopathy)  40 g daily.     metoprolol tartrate 25 MG tablet  Commonly known as:  LOPRESSOR  Take 12.5 mg by mouth 2 (two) times  daily.     NAMENDA XR 28 MG Cp24  Generic drug:  Memantine HCl ER  Take 28 mg by mouth daily.     omeprazole 20 MG capsule  Commonly known as:  PRILOSEC  Take 20 mg by mouth daily. Take one daily     oxyCODONE 5 MG immediate release tablet  Commonly known as:  Oxy IR/ROXICODONE  Take 1 tablet (5 mg total) by mouth every 6 (six) hours as needed for severe pain.     potassium chloride 10 MEQ tablet  Commonly known as:  K-DUR,KLOR-CON  Take 10 mEq by mouth daily.         The results of significant diagnostics from this hospitalization (including imaging, microbiology, ancillary and laboratory) are listed below for reference.    Significant Diagnostic Studies: Dg Chest 1 View  05/04/2014   CLINICAL DATA:  Preop, fracture pelvis  EXAM: CHEST - 1 VIEW  COMPARISON:  10/08/2012  FINDINGS: Cardiac silhouette upper normal. Vascular pattern normal. No pneumothorax consolidation or effusion.  IMPRESSION: No active disease.   Electronically Signed   By: Skipper Cliche M.D.   On: 05/04/2014 07:53   Dg Hip Complete Left  05/04/2014   CLINICAL DATA:  [place] this morning with left hip and leg pain  EXAM: LEFT HIP - COMPLETE 2+ VIEW  COMPARISON:  None.  FINDINGS: There is destruction of the superior medial and inferior surfaces of the left acetabulum. There is a bone fragment displaced 17 mm medially from the left acetabulum. Comminuted fracture line extends to involve superior pubic ramus laterally. On the frontal view there is an oblique linear lucency projecting over the sub trochanteric femur which appears continue with with soft tissue planes. The other images do not show evidence of femur fracture. No evidence of femur dislocation.  IMPRESSION: Left acetabular fracture.   Electronically Signed   By: Skipper Cliche M.D.   On: 05/04/2014 07:27   Ct Pelvis Wo Contrast  05/04/2014   CLINICAL DATA:  Further evaluation of the left pelvic/ acetabular fracture.  EXAM: CT PELVIS WITHOUT CONTRAST   TECHNIQUE: Multidetector CT imaging of the pelvis was performed following the standard protocol without intravenous contrast.  COMPARISON:  Current pelvis and left hip radiographs.  FINDINGS: There is a comminuted fracture of the left acetabulum. Fracture lines involve the anterior column and medial acetabular wall, but spare the posterior column. An oblique sagittal fracture extends across the anterior medial acetabular roof. Another fracture extends obliquely from the anteromedial and inferior acetabulum. Fractures converge at the medial aspect of the superior, posterior acetabulum. The fractured medial acetabular wall as displaced medially by approximately 9 mm.  Small bony fracture fragments lie along the inferior margin of the joint space below  the left femoral head neck junction. There is a nondisplaced fracture across the inferior acetabular rim.  Hemorrhage in edema from the fracture lies along the left lateral pelvis displacing the bladder to the right.  No other fractures. SI joints are normally spaced and aligned as is pubic symphysis. Hip joints are normally aligned. No proximal femur fracture. Bones are diffusely demineralized.  There is a hernia of the lower anterior abdominal wall along the midline with a base measuring 4 cm, containing small bowel without evidence of incarceration, strangulation or obstruction.  No pelvic masses.  No adenopathy.  There are significant degenerative changes of the visualized lower lumbar spine.  IMPRESSION: 1. Comminuted fracture of the left acetabulum. Fracture involves the medial acetabular wall and anterior column, but spares the posterior column. Fracture fragments are displaced medially by approximately 9 mm creating a protrusio acetabula. There is associated hemorrhage along the left margin of the pelvis displacing the bladder to the right.   Electronically Signed   By: Lajean Manes M.D.   On: 05/04/2014 08:08    Microbiology: Recent Results (from the past  240 hour(s))  URINE CULTURE     Status: None   Collection Time    05/04/14  8:45 AM      Result Value Ref Range Status   Specimen Description IN/OUT CATH URINE   Final   Special Requests NONE   Final   Culture  Setup Time     Final   Value: 05/04/2014 12:52     Performed at [facility]     Final   Value: >=100,000 COLONIES/ML     Performed at [facility]   Culture     Final   Value: VIRIDANS STREPTOCOCCUS     Performed at [facility]   Report Status 05/05/2014 FINAL   Final     Labs: Basic Metabolic Panel:  Recent Labs Lab 05/04/14 0715 05/05/14 0404  NA 139 142  K 3.8 4.3  CL 101 103  CO2 27 30  GLUCOSE 118* 123*  BUN 19 16  CREATININE 0.72 0.81  CALCIUM 9.2 9.0  MG  --  1.7  PHOS  --  3.5   Liver Function Tests:  Recent Labs Lab 05/05/14 0404  AST 11  ALT 8  ALKPHOS 74  BILITOT 0.8  PROT 5.8*  ALBUMIN 2.7*   CBC:  Recent Labs Lab 05/04/14 0715 05/05/14 0404 05/06/14 0516  WBC 10.9* 9.0 9.1  NEUTROABS 8.0*  --   --   HGB 11.4* 9.6* 10.1*  HCT 34.4* 30.2* 30.6*  MCV 89.6 90.7 88.7  PLT 235 160 152    Signed:  Harlan Vinal  [facility] 05/06/2014, 10:16 AM

## 2014-05-07 LAB — BASIC METABOLIC PANEL
BUN: 18 mg/dL (ref 4–21)
Creatinine: 0.8 mg/dL (ref 0.5–1.1)
Glucose: 125 mg/dL
Potassium: 3.8 mmol/L (ref 3.4–5.3)
SODIUM: 140 mmol/L (ref 137–147)

## 2014-05-07 LAB — CBC AND DIFFERENTIAL
HCT: 29 % — AB (ref 36–46)
HEMOGLOBIN: 9.9 g/dL — AB (ref 12.0–16.0)
Platelets: 150 10*3/uL (ref 150–399)
WBC: 9.3 10^3/mL

## 2014-05-07 LAB — HEPATIC FUNCTION PANEL
ALT: 8 U/L (ref 7–35)
AST: 8 U/L — AB (ref 13–35)
Alkaline Phosphatase: 68 U/L (ref 25–125)
BILIRUBIN, TOTAL: 0.9 mg/dL

## 2014-05-08 ENCOUNTER — Non-Acute Institutional Stay (SKILLED_NURSING_FACILITY): Payer: Medicare Other | Admitting: Nurse Practitioner

## 2014-05-08 ENCOUNTER — Encounter: Payer: Self-pay | Admitting: Nurse Practitioner

## 2014-05-08 DIAGNOSIS — I4891 Unspecified atrial fibrillation: Secondary | ICD-10-CM | POA: Insufficient documentation

## 2014-05-08 DIAGNOSIS — R413 Other amnesia: Secondary | ICD-10-CM

## 2014-05-08 DIAGNOSIS — E876 Hypokalemia: Secondary | ICD-10-CM

## 2014-05-08 DIAGNOSIS — N390 Urinary tract infection, site not specified: Secondary | ICD-10-CM

## 2014-05-08 DIAGNOSIS — C189 Malignant neoplasm of colon, unspecified: Secondary | ICD-10-CM

## 2014-05-08 DIAGNOSIS — I480 Paroxysmal atrial fibrillation: Secondary | ICD-10-CM

## 2014-05-08 DIAGNOSIS — IMO0002 Reserved for concepts with insufficient information to code with codable children: Secondary | ICD-10-CM

## 2014-05-08 DIAGNOSIS — K219 Gastro-esophageal reflux disease without esophagitis: Secondary | ICD-10-CM

## 2014-05-08 DIAGNOSIS — S32402S Unspecified fracture of left acetabulum, sequela: Secondary | ICD-10-CM

## 2014-05-08 DIAGNOSIS — M25552 Pain in left hip: Secondary | ICD-10-CM

## 2014-05-08 DIAGNOSIS — F333 Major depressive disorder, recurrent, severe with psychotic symptoms: Secondary | ICD-10-CM

## 2014-05-08 DIAGNOSIS — M25559 Pain in unspecified hip: Secondary | ICD-10-CM

## 2014-05-08 NOTE — Assessment & Plan Note (Signed)
02/09/14 Serum K 3.4 02/07/14. Kcl 56meq daily. 02/15/14 K 4.2 03/19/14 K 4.2 05/07/14 K 3.8

## 2014-05-08 NOTE — Assessment & Plan Note (Signed)
takes Namenda 28nmg and Cymbalta 60mg . Usual state of mentation. Likely vascular dementia is nature in setting of hx PE/DVT. SLUMS 05/04/14 4/30

## 2014-05-08 NOTE — Progress Notes (Signed)
Patient ID: Erin Hansen, female   DOB: 1926-06-22, 80 y.o.   MRN: [id number]   Code Status: DNR  Allergies  Allergen Reactions  . Codeine Nausea Only    Chief Complaint  Patient presents with  . Medical Management of Chronic Issues  . Acute Visit    left hip pain  . Readmit To SNF    HPI: Patient is a 80 y.o. female seen in the SNF at [facility] today for  evaluation of left hip pain, confusion,  depression with psychotic features,  and chronic medical conditions   Hospitalized 05/04/2014-05/06/2014 Fall at nursing home Left acetabular fracture Per orthopedic surgery, continue bed to chair transfers for the next 8-12 weeks. Follow up with Dr. Marcelino Scot in 2 weeks  ED she underwent a CT pelvis which showed left acetabulum fracture and associated hemorrhage in her left pelvis.   Problem List Items Addressed This Visit   A-fib - Primary     A fib - appears to be new, rate controlled, not a candidate for anticoagulation currently.EKG    Acetabular fracture     Left acetabular fracture - ortho surgery consulted, non operative management, will have to be bed to chair transfer for the next 8-12 weeks per ortho. Dr. Marcelino Scot will have her follow up in his office in ~2 weeks and will repeat XRay at that time.      Colon cancer      s/p surgery and chemotherapy, recently saw Dr. Benay Spice.      GERD (gastroesophageal reflux disease)     Stable, takes Omeprazole 20mg       Hypokalemia     02/09/14 Serum K 3.4 02/07/14. Kcl 73meq daily. 02/15/14 K 4.2 03/19/14 K 4.2 05/07/14 K 3.8    Left hip pain     Will schedule Oxycodone 5mg  am and continue with Oxycodone 5mg  q6h prn.     Memory loss     takes Namenda 28nmg and Cymbalta 60mg . Usual state of mentation. Likely vascular dementia is nature in setting of hx PE/DVT. SLUMS 05/04/14 4/30     Severe major depression with psychotic features     No longer c/o psychological pain. Staff reported crying episodes x4 in the past 11 days prior to  Abilify 10mg  04/13/14-too soon to eval. The patient was seen well dressed in her room and versed calmly with lots of confusion-she told me her deceased husband was out there working today.  Takes Cymbalta 60mg  daily and Prn Lorazepam 0.5mg  q6hr available to her.  Observe for negative side effects. She said occasionally she doesn't sleep well at night.       Urinary tract infection, site not specified     UTI - continue Ceftriaxone, cultures with strep viridans, likely contaminant. Initial UA not impressive with 2-6 WBC, negative Nitrates, patient afebrile and without significant leukocytosis.        Review of Systems:  Review of Systems  Constitutional: Positive for malaise/fatigue. Negative for fever, chills, weight loss and diaphoresis.  HENT: Positive for hearing loss. Negative for congestion, ear discharge and sore throat.   Eyes: Negative for blurred vision, pain, discharge and redness.  Respiratory: Positive for cough. Negative for sputum production, shortness of breath and wheezing.   Cardiovascular: Negative for chest pain, palpitations, orthopnea, claudication, leg swelling and PND.  Gastrointestinal: Negative for heartburn, nausea, vomiting, abdominal pain, diarrhea, constipation and blood in stool.       Colostomy  Genitourinary: Positive for frequency (incontinent of bladder). Negative for  dysuria, urgency and hematuria.  Musculoskeletal: Positive for back pain, falls and joint pain. Negative for myalgias and neck pain.       [address] 03/29/14 w/o apparent injury.  L hip pain with ROM and weight bearing. No neurovascular deficit.   Skin: Negative for itching and rash.  Neurological: Negative for dizziness, tingling, tremors, sensory change, speech change, focal weakness, seizures, loss of consciousness, weakness and headaches.  Endo/Heme/Allergies: Negative for environmental allergies and polydipsia. Does not bruise/bleed easily.  Psychiatric/Behavioral: Positive for depression and  memory loss. Negative for hallucinations. The patient is nervous/anxious and has insomnia.        Increased confusion. Less psychotic presentation. But still c/o psychological pain. Staff reported crying episodes x4 in the past 11 days. POA cancelled Psychiatry appointment.      Past Medical History  Diagnosis Date  . Compression fracture     t12  . IBS (irritable bowel syndrome)   . Vaginal prolapse   . TMJ (temporomandibular joint disorder)   . Anemia   . Hearing loss   . Blood in stool   . Anxiety   . Unspecified essential hypertension 11/18/2012  . Coronary atherosclerosis of native coronary artery 10/25/2012  . Long term (current) use of anticoagulants 10/25/2012  . Other pulmonary embolism and infarction 10/15/2012  . Phlebitis and thrombophlebitis of other deep vessels of lower extremities 10/15/2012  . 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 Vitamin D deficiency 09/27/2012  . Hypopotassemia 09/27/2012  . Major depressive disorder, single episode, unspecified 09/27/2012  . Blepharitis, unspecified 09/27/2012  . Diffuse cystic mastopathy 09/27/2012  . Muscle weakness (generalized) 09/27/2012  . Abnormal involuntary movements(781.0) 09/27/2012  . Edema 09/27/2012  . Unspecified urinary incontinence 09/27/2012  . Colostomy status 09/27/2012  . Cancer     Colon  . Malignant neoplasm of colon, unspecified site 09/27/2012  . DJD (degenerative joint disease)   . Degeneration of intervertebral disc, site unspecified 09/27/2012   Past Surgical History  Procedure Laterality Date  . Appendectomy    . Vaginal prolapse repair  2005 - approximate  . Cystectomy      back  . Nasal reconstruction with septal repair  1983  . Skin cancer excision    . Arthroscopy left knee  2007  . Colonoscopy    . Total abdominal hysterectomy  1978  . Hernia repair  1980 - approximate  . Eye surgery  [phone]    cataracts bilateral  . Back surgery      mid back  .  Laparoscopic sigmoid colectomy  09/20/2012    Procedure: LAPAROSCOPIC SIGMOID COLECTOMY;  Surgeon: Rolm Bookbinder, MD;  Location: [facility];  Service: General;  Laterality: N/A;  Laparoscopic possible Open Sigmoid Colectomy,Colostomy  . Colostomy  09/20/2012    Procedure: COLOSTOMY;  Surgeon: Rolm Bookbinder, MD;  Location: [facility];  Service: General;  Laterality: N/A;  . Vertebroplasty  11/03/2013    T12   Social History:   reports that she has never smoked. She has never used smokeless tobacco. She reports that she does not drink alcohol or use illicit drugs.  Family History  Problem Relation Age of Onset  . Cancer Son     spine  . Cancer Son     Medications: Patient's Medications  New Prescriptions   No medications on file  Previous Medications   ACETAMINOPHEN (TYLENOL) 500 MG TABLET    Take 1,000 mg by mouth every 4 (four) hours as needed for pain.  ARIPIPRAZOLE (ABILIFY) 10 MG TABLET    Take 10 mg by mouth daily.   DOCUSATE SODIUM (COLACE) 100 MG CAPSULE    Take 100 mg by mouth 2 (two) times daily.   DULOXETINE (CYMBALTA) 60 MG CAPSULE    Take 60 mg by mouth daily.   ENULOSE 10 GM/15ML SOLN    40 g daily.    MEMANTINE HCL ER (NAMENDA XR) 28 MG CP24    Take 28 mg by mouth daily.   METOPROLOL TARTRATE (LOPRESSOR) 25 MG TABLET    Take 12.5 mg by mouth 2 (two) times daily.    OMEPRAZOLE (PRILOSEC) 20 MG CAPSULE    Take 20 mg by mouth daily. Take one daily   OXYCODONE (OXY IR/ROXICODONE) 5 MG IMMEDIATE RELEASE TABLET    Take 1 tablet (5 mg total) by mouth every 6 (six) hours as needed for severe pain.   POTASSIUM CHLORIDE (K-DUR,KLOR-CON) 10 MEQ TABLET    Take 10 mEq by mouth daily.  Modified Medications   No medications on file  Discontinued Medications   No medications on file     Physical Exam: Physical Exam  Constitutional: She is oriented to person, place, and time. She appears well-developed and well-nourished.  HENT:  Head: Normocephalic and atraumatic.  Eyes:  Conjunctivae and EOM are normal. Pupils are equal, round, and reactive to light.  Neck: Normal range of motion. Neck supple. No JVD present. No tracheal deviation present.  Cardiovascular: Normal rate and regular rhythm.   No murmur heard. Pulmonary/Chest: Effort normal and breath sounds normal. She has no wheezes. She has no rales.  Abdominal: Soft. Bowel sounds are normal. There is no tenderness.  colostomy  Musculoskeletal: Normal range of motion. She exhibits tenderness. She exhibits no edema.  [address] 03/29/14 w/o apparent injury.  L hip pain with ROM and weight bearing. No neurovascular deficit.     Lymphadenopathy:    She has no cervical adenopathy.  Neurological: She is alert and oriented to person, place, and time. She has normal reflexes. No cranial nerve deficit. She exhibits normal muscle tone. Coordination normal.  Skin: Skin is warm and dry. No rash noted.  Psychiatric: Her affect is inappropriate. Her speech is delayed. She is not agitated, not aggressive, not slowed, not withdrawn and not actively hallucinating. Thought content is not paranoid and not delusional. Cognition and memory are impaired. She expresses inappropriate judgment. She does not express impulsivity. She exhibits a depressed mood. She exhibits abnormal recent memory.  Pulled off colostomy bag and milking the stoma 09/21/13. Doesn't feel like to eat or doing anything.    (type .physexam) Filed Vitals:   05/08/14 1530  BP: 116/52  Pulse: 70  Temp: 98.2 F (36.8 C)  TempSrc: Tympanic  Resp: 20      Labs reviewed: Basic Metabolic Panel:  Recent Labs  10/12/13  05/04/14 0715 05/05/14 0404 05/07/14  NA 141  < > 139 142 140  K 4.0  < > 3.8 4.3 3.8  CL  --   --  101 103  --   CO2  --   --  27 30  --   GLUCOSE  --   --  118* 123*  --   BUN 18  < > 19 16 18   CREATININE 0.7  < > 0.72 0.81 0.8  CALCIUM  --   --  9.2 9.0  --   MG  --   --   --  1.7  --   PHOS  --   --   --  3.5  --   TSH 1.98  --   --    --   --   < > = values in this interval not displayed. Liver Function Tests:  Recent Labs  04/23/14 05/05/14 0404 05/07/14  AST 10* 11 8*  ALT 8 8 8   ALKPHOS 61 74 68  BILITOT  --  0.8  --   PROT  --  5.8*  --   ALBUMIN  --  2.7*  --    CBC:  Recent Labs  06/05/13 1019  05/04/14 0715 05/05/14 0404 05/06/14 0516 05/07/14  WBC 7.5  < > 10.9* 9.0 9.1 9.3  NEUTROABS 3.6  --  8.0*  --   --   --   HGB 11.2*  < > 11.4* 9.6* 10.1* 9.9*  HCT 33.2*  < > 34.4* 30.2* 30.6* 29*  MCV 99.6  --  89.6 90.7 88.7  --   PLT 254  < > 235 160 152 150  < > = values in this interval not displayed.  Past Procedures:  11/03/13 X-ray T spine: marked vertebral body compression deformity at the approximate T12 level with 90% loss of body height. The patient is status post vertebroplasty at T12.   02/06/14 CXR no active cardiopulmonary disease.  Assessment/Plan Acetabular fracture Left acetabular fracture - ortho surgery consulted, non operative management, will have to be bed to chair transfer for the next 8-12 weeks per ortho. Dr. Marcelino Scot will have her follow up in his office in ~2 weeks and will repeat XRay at that time.    Urinary tract infection, site not specified UTI - continue Ceftriaxone, cultures with strep viridans, likely contaminant. Initial UA not impressive with 2-6 WBC, negative Nitrates, patient afebrile and without significant leukocytosis.   Anemia nemia - she is on Xarelto for history of PE, now with pelvic hemorrhage due to fall. Hold Xarelto.  - CBC trending down initially, stable on discharge  - repeat CBC   Colon cancer  s/p surgery and chemotherapy, recently saw Dr. Benay Spice.    Memory loss takes Namenda 28nmg and Cymbalta 60mg . Usual state of mentation. Likely vascular dementia is nature in setting of hx PE/DVT. SLUMS 05/04/14 4/30   A-fib A fib - appears to be new, rate controlled, not a candidate for anticoagulation currently.EKG  GERD (gastroesophageal reflux  disease) Stable, takes Omeprazole 20mg     Severe major depression with psychotic features No longer c/o psychological pain. Staff reported crying episodes x4 in the past 11 days prior to Abilify 10mg  04/13/14-too soon to eval. The patient was seen well dressed in her room and versed calmly with lots of confusion-she told me her deceased husband was out there working today.  Takes Cymbalta 60mg  daily and Prn Lorazepam 0.5mg  q6hr available to her.  Observe for negative side effects. She said occasionally she doesn't sleep well at night.     Hypokalemia 02/09/14 Serum K 3.4 02/07/14. Kcl 36meq daily. 02/15/14 K 4.2 03/19/14 K 4.2 05/07/14 K 3.8  Left hip pain Will schedule Oxycodone 5mg  am and continue with Oxycodone 5mg  q6h prn.     Family/ Staff Communication: observe the patient.   Goals of Care: SNF   Labs/tests ordered: CBC. EKG

## 2014-05-08 NOTE — Assessment & Plan Note (Signed)
Stable, takes Omeprazole 20mg 

## 2014-05-08 NOTE — Assessment & Plan Note (Signed)
s/p surgery and chemotherapy, recently saw Dr. Benay Spice.

## 2014-05-08 NOTE — Assessment & Plan Note (Signed)
A fib - appears to be new, rate controlled, not a candidate for anticoagulation currently.EKG

## 2014-05-08 NOTE — Assessment & Plan Note (Signed)
UTI - continue Ceftriaxone, cultures with strep viridans, likely contaminant. Initial UA not impressive with 2-6 WBC, negative Nitrates, patient afebrile and without significant leukocytosis.

## 2014-05-08 NOTE — Assessment & Plan Note (Signed)
Will schedule Oxycodone 5mg  am and continue with Oxycodone 5mg  q6h prn.

## 2014-05-08 NOTE — Assessment & Plan Note (Signed)
No longer c/o psychological pain. Staff reported crying episodes x4 in the past 11 days prior to Abilify 10mg  04/13/14-too soon to eval. The patient was seen well dressed in her room and versed calmly with lots of confusion-she told me her deceased husband was out there working today.  Takes Cymbalta 60mg  daily and Prn Lorazepam 0.5mg  q6hr available to her.  Observe for negative side effects. She said occasionally she doesn't sleep well at night.

## 2014-05-08 NOTE — Assessment & Plan Note (Signed)
Left acetabular fracture - ortho surgery consulted, non operative management, will have to be bed to chair transfer for the next 8-12 weeks per ortho. Dr. Marcelino Scot will have her follow up in his office in ~2 weeks and will repeat XRay at that time.

## 2014-05-08 NOTE — Assessment & Plan Note (Signed)
nemia - she is on Xarelto for history of PE, now with pelvic hemorrhage due to fall. Hold Xarelto.  - CBC trending down initially, stable on discharge  - repeat CBC

## 2014-05-11 ENCOUNTER — Non-Acute Institutional Stay (SKILLED_NURSING_FACILITY): Payer: Medicare Other | Admitting: Internal Medicine

## 2014-05-11 DIAGNOSIS — I4891 Unspecified atrial fibrillation: Secondary | ICD-10-CM

## 2014-05-11 DIAGNOSIS — F0151 Vascular dementia with behavioral disturbance: Secondary | ICD-10-CM

## 2014-05-11 DIAGNOSIS — F333 Major depressive disorder, recurrent, severe with psychotic symptoms: Secondary | ICD-10-CM

## 2014-05-11 DIAGNOSIS — F05 Delirium due to known physiological condition: Secondary | ICD-10-CM

## 2014-05-11 DIAGNOSIS — I480 Paroxysmal atrial fibrillation: Secondary | ICD-10-CM

## 2014-05-11 DIAGNOSIS — R410 Disorientation, unspecified: Secondary | ICD-10-CM

## 2014-05-11 DIAGNOSIS — S32402S Unspecified fracture of left acetabulum, sequela: Secondary | ICD-10-CM

## 2014-05-11 DIAGNOSIS — C189 Malignant neoplasm of colon, unspecified: Secondary | ICD-10-CM

## 2014-05-11 DIAGNOSIS — N390 Urinary tract infection, site not specified: Secondary | ICD-10-CM

## 2014-05-11 DIAGNOSIS — IMO0002 Reserved for concepts with insufficient information to code with codable children: Secondary | ICD-10-CM

## 2014-05-11 DIAGNOSIS — F0150 Vascular dementia without behavioral disturbance: Secondary | ICD-10-CM

## 2014-05-11 NOTE — Progress Notes (Signed)
Patient ID: Erin Hansen, female   DOB: 1926/08/04, 80 y.o.   MRN: [id number]  Provider:  Rexene Edison. Mariea Clonts, D.O., C.M.D.  Location:  [facility] Rm [facility]  PCP: Estill Dooms, MD  Code Status: DNR  Allergies  Allergen Reactions  . Codeine Nausea Only    Chief Complaint  Patient presents with  . Readmit To SNF    HPI: 80 y.o. female with h/o depression with psychosis, colon cancer with colostomy and prior chemo, afib that was new during hospitalization but rate controlled, hypokalemia, and vascular dementia was seen upon readmission to SNF.  She was hospitalized from 8/28-8/30 after a fall with left acetabular fracture.  She is nonweightbearing with bed to chair transfers for 8-12 wks and to see Dr. Marcelino Scot in 2 wks.  She was also treated for possible UTI.  She remains depressed and confused, but this appears to be ongoing now and is being medically managed.  With her mental state, her prognosis is not good.  She is not eating and drinking well either.  ROS:pt unable to answer many questions--speaks about writing something that she did incorrectly and thinks I am here to talk about it? Review of Systems  Constitutional: Positive for weight loss and malaise/fatigue. Negative for fever and chills.  Musculoskeletal: Positive for falls.       Left hip fx  Neurological: Positive for weakness.  Psychiatric/Behavioral: Positive for depression, hallucinations and memory loss. Negative for suicidal ideas.     Past Medical History  Diagnosis Date  . Compression fracture     t12  . IBS (irritable bowel syndrome)   . Vaginal prolapse   . TMJ (temporomandibular joint disorder)   . Anemia   . Hearing loss   . Blood in stool   . Anxiety   . Unspecified essential hypertension 11/18/2012  . Coronary atherosclerosis of native coronary artery 10/25/2012  . Long term (current) use of anticoagulants 10/25/2012  . Other pulmonary embolism and infarction 10/15/2012  . Phlebitis and  thrombophlebitis of other deep vessels of lower extremities 10/15/2012  . 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 Vitamin D deficiency 09/27/2012  . Hypopotassemia 09/27/2012  . Major depressive disorder, single episode, unspecified 09/27/2012  . Blepharitis, unspecified 09/27/2012  . Diffuse cystic mastopathy 09/27/2012  . Muscle weakness (generalized) 09/27/2012  . Abnormal involuntary movements(781.0) 09/27/2012  . Edema 09/27/2012  . Unspecified urinary incontinence 09/27/2012  . Colostomy status 09/27/2012  . Cancer     Colon  . Malignant neoplasm of colon, unspecified site 09/27/2012  . DJD (degenerative joint disease)   . Degeneration of intervertebral disc, site unspecified 09/27/2012   Past Surgical History  Procedure Laterality Date  . Appendectomy    . Vaginal prolapse repair  2005 - approximate  . Cystectomy      back  . Nasal reconstruction with septal repair  1983  . Skin cancer excision    . Arthroscopy left knee  2007  . Colonoscopy    . Total abdominal hysterectomy  1978  . Hernia repair  1980 - approximate  . Eye surgery  [phone]    cataracts bilateral  . Back surgery      mid back  . Laparoscopic sigmoid colectomy  09/20/2012    Procedure: LAPAROSCOPIC SIGMOID COLECTOMY;  Surgeon: Rolm Bookbinder, MD;  Location: [facility];  Service: General;  Laterality: N/A;  Laparoscopic possible Open Sigmoid Colectomy,Colostomy  . Colostomy  09/20/2012    Procedure:  COLOSTOMY;  Surgeon: Rolm Bookbinder, MD;  Location: [facility];  Service: General;  Laterality: N/A;  . Vertebroplasty  11/03/2013    T12   Social History:   reports that she has never smoked. She has never used smokeless tobacco. She reports that she does not drink alcohol or use illicit drugs.  Family History  Problem Relation Age of Onset  . Cancer Son     spine  . Cancer Son     Medications: Patient's Medications  New Prescriptions   No medications on file  Previous  Medications   ACETAMINOPHEN (TYLENOL) 500 MG TABLET    Take 1,000 mg by mouth every 4 (four) hours as needed for pain.   ARIPIPRAZOLE (ABILIFY) 10 MG TABLET    Take 10 mg by mouth daily.   DOCUSATE SODIUM (COLACE) 100 MG CAPSULE    Take 100 mg by mouth 2 (two) times daily.   DULOXETINE (CYMBALTA) 60 MG CAPSULE    Take 60 mg by mouth daily.   ENULOSE 10 GM/15ML SOLN    40 g daily.    MEMANTINE HCL ER (NAMENDA XR) 28 MG CP24    Take 28 mg by mouth daily.   METOPROLOL TARTRATE (LOPRESSOR) 25 MG TABLET    Take 12.5 mg by mouth 2 (two) times daily.    OMEPRAZOLE (PRILOSEC) 20 MG CAPSULE    Take 20 mg by mouth daily. Take one daily   OXYCODONE (OXY IR/ROXICODONE) 5 MG IMMEDIATE RELEASE TABLET    Take 1 tablet (5 mg total) by mouth every 6 (six) hours as needed for severe pain.   POTASSIUM CHLORIDE (K-DUR,KLOR-CON) 10 MEQ TABLET    Take 10 mEq by mouth daily.  Modified Medications   No medications on file  Discontinued Medications   No medications on file   Physical Exam: Physical Exam  Constitutional:  Ill-appearing white female sitting up in chair sleeping  HENT:  Head: Normocephalic and atraumatic.  Eyes:  Wearing glasses  Neck: Neck supple. No JVD present.  Cardiovascular: Normal rate, regular rhythm, normal heart sounds and intact distal pulses.   Pulmonary/Chest: Effort normal and breath sounds normal. No respiratory distress. She has no wheezes. She has no rales.  Abdominal: Soft. Bowel sounds are normal. She exhibits no distension. There is no tenderness.  Colostomy in place with brown stool  Musculoskeletal: Normal range of motion.  Neurological:  Lethargic, is arousable, but illogical speech, not relevant to questions asked; ros was obtained from staff  Skin: Skin is warm and dry. There is pallor.  Psychiatric:  Flat affect, see above     Labs reviewed: Basic Metabolic Panel:  Recent Labs  05/04/14 0715 05/05/14 0404 05/07/14  NA 139 142 140  K 3.8 4.3 3.8  CL 101  103  --   CO2 27 30  --   GLUCOSE 118* 123*  --   BUN 19 16 18   CREATININE 0.72 0.81 0.8  CALCIUM 9.2 9.0  --   MG  --  1.7  --   PHOS  --  3.5  --    Liver Function Tests:  Recent Labs  04/23/14 05/05/14 0404 05/07/14  AST 10* 11 8*  ALT 8 8 8   ALKPHOS 61 74 68  BILITOT  --  0.8  --   PROT  --  5.8*  --   ALBUMIN  --  2.7*  --    No results found for this basename: LIPASE, AMYLASE,  in the last 8760 hours No results  found for this basename: AMMONIA,  in the last 8760 hours CBC:  Recent Labs  06/05/13 1019  05/04/14 0715 05/05/14 0404 05/06/14 0516 05/07/14  WBC 7.5  < > 10.9* 9.0 9.1 9.3  NEUTROABS 3.6  --  8.0*  --   --   --   HGB 11.2*  < > 11.4* 9.6* 10.1* 9.9*  HCT 33.2*  < > 34.4* 30.2* 30.6* 29*  MCV 99.6  --  89.6 90.7 88.7  --   PLT 254  < > 235 160 152 150  < > = values in this interval not displayed. Cardiac Enzymes: No results found for this basename: CKTOTAL, CKMB, CKMBINDEX, TROPONINI,  in the last 8760 hours BNP: No components found with this basename: POCBNP,  CBG:  Recent Labs  05/06/14 1040  GLUCAP 156*    Imaging and Procedures:d/c summary reviewed with hip imaging  Assessment/Plan 1. Acetabular fracture, left, sequela -cont nonweightbearing with bed to chair transfers per ortho -f/u in 2 wks with Dr. Nilda Simmer for f/u xrays -cont pain control with tylenol and oxycodone prn  2. Urinary tract infection, site not specified -completed abx, but UA was indeterminate  3. Colon cancer -s/p colostomy and chemo -follows with Dr. Learta Codding  4. Vascular dementia with delirium -delirium suggests she is declining quickly since her fracture -has been started on namenda XR -likely due to hip fx, pain meds, cancer, and her depression  5. Paroxysmal atrial fibrillation - cont metoprolol for rate control, no anticoagulation at this point--would be dangerous for her  6. Severe recurrent major depressive disorder with psychotic features -cont  cymbalta with abilify for psychosis -no signs she is a harm to herself aside from her poor intake and minimal interaction with the environment  Functional status:  Dependent in adls  Family/ staff Communication: discussed with NP Mast  Labs/tests ordered:  No new tests today

## 2014-05-12 ENCOUNTER — Encounter: Payer: Self-pay | Admitting: Internal Medicine

## 2014-05-15 ENCOUNTER — Encounter: Payer: Self-pay | Admitting: Nurse Practitioner

## 2014-05-15 ENCOUNTER — Non-Acute Institutional Stay (SKILLED_NURSING_FACILITY): Payer: Medicare Other | Admitting: Nurse Practitioner

## 2014-05-15 DIAGNOSIS — IMO0002 Reserved for concepts with insufficient information to code with codable children: Secondary | ICD-10-CM

## 2014-05-15 DIAGNOSIS — R413 Other amnesia: Secondary | ICD-10-CM

## 2014-05-15 DIAGNOSIS — I4891 Unspecified atrial fibrillation: Secondary | ICD-10-CM

## 2014-05-15 DIAGNOSIS — N390 Urinary tract infection, site not specified: Secondary | ICD-10-CM

## 2014-05-15 DIAGNOSIS — K219 Gastro-esophageal reflux disease without esophagitis: Secondary | ICD-10-CM

## 2014-05-15 DIAGNOSIS — R627 Adult failure to thrive: Secondary | ICD-10-CM | POA: Insufficient documentation

## 2014-05-15 DIAGNOSIS — S32402S Unspecified fracture of left acetabulum, sequela: Secondary | ICD-10-CM

## 2014-05-15 DIAGNOSIS — C189 Malignant neoplasm of colon, unspecified: Secondary | ICD-10-CM

## 2014-05-15 DIAGNOSIS — E876 Hypokalemia: Secondary | ICD-10-CM

## 2014-05-15 DIAGNOSIS — I480 Paroxysmal atrial fibrillation: Secondary | ICD-10-CM

## 2014-05-15 DIAGNOSIS — F333 Major depressive disorder, recurrent, severe with psychotic symptoms: Secondary | ICD-10-CM

## 2014-05-15 DIAGNOSIS — M25552 Pain in left hip: Secondary | ICD-10-CM

## 2014-05-15 DIAGNOSIS — M25559 Pain in unspecified hip: Secondary | ICD-10-CM

## 2014-05-15 NOTE — Progress Notes (Signed)
Patient ID: Erin Hansen, female   DOB: Jul 02, 1926, 80 y.o.   MRN: [id number]   Code Status: DNR  Allergies  Allergen Reactions  . Codeine Nausea Only    Chief Complaint  Patient presents with  . Medical Management of Chronic Issues  . Acute Visit    deconditioning.     HPI: Patient is a 80 y.o. female seen in the SNF at [facility] today for  evaluation of deconditioning-decreased oral intake, unable to take Symbalta and Colace, sleeps more,  left hip pain, confusion,  depression with psychotic features,  and chronic medical conditions   Hospitalized 05/04/2014-05/06/2014 Fall at nursing home Left acetabular fracture Per orthopedic surgery, continue bed to chair transfers for the next 8-12 weeks. Follow up with Dr. Marcelino Scot in 2 weeks  ED she underwent a CT pelvis which showed left acetabulum fracture and associated hemorrhage in her left pelvis.   Problem List Items Addressed This Visit   Colon cancer     s/p surgery and chemotherapy. Colostomy.      Severe major depression with psychotic features     No longer c/o psychological pain. Staff reported crying episodes x4 in the past 11 days prior to Abilify 10mg  04/13/14-too soon to eval. The patient was seen well dressed in her room and versed calmly with lots of confusion-she told me her deceased husband was out there working today.  dc Cymbalta 60mg  daily-unable to take orally in the past a few days and will continue Prn Lorazepam 0.5mg  q6hr available to her.  Observe for negative side effects.       Memory loss     takes Namenda 28nmg. Usual state of mentation. Likely vascular dementia is nature in setting of hx PE/DVT. SLUMS 05/04/14 4/30    GERD (gastroesophageal reflux disease)     Stable, takes Omeprazole 20mg        Urinary tract infection, site not specified     UTI - treated with  Ceftriaxone, cultures with strep viridans, likely contaminant. Initial UA not impressive with 2-6 WBC, negative Nitrates, patient afebrile  and without significant leukocytosis.      Hypokalemia     02/09/14 Serum K 3.4 02/07/14. Kcl 63meq daily. 02/15/14 K 4.2 03/19/14 K 4.2 05/07/14 K 3.8     Acetabular fracture     Left acetabular fracture - ortho surgery consulted, non operative management, will have to be bed to chair transfer for the next 8-12 weeks per ortho. Dr. Marcelino Scot will have her follow up in his office in ~2 weeks and will repeat XRay at that time. Will repeat X-[facility] L hip at [facility].       A-fib     A fib - appears to be new, rate controlled, not a candidate for anticoagulation currently.EKG     Left hip pain     Pain is reasonably controlled with Oxycodone 5mg  am and continue with Oxycodone 5mg  q6h prn.      FTT (failure to thrive) in adult - Primary     Eats less and sleeps more in setting of dementia, depression, colon cancer, and recent L hip fx. Hospice consult. Dc Colace and Symbalta-difficulty swallowing in the past a few days.        Review of Systems:  Review of Systems  Constitutional: Positive for malaise/fatigue. Negative for fever, chills, weight loss and diaphoresis.  HENT: Positive for hearing loss. Negative for congestion, ear discharge and sore throat.   Eyes: Negative for blurred vision, pain,  discharge and redness.  Respiratory: Positive for cough. Negative for sputum production, shortness of breath and wheezing.   Cardiovascular: Negative for chest pain, palpitations, orthopnea, claudication, leg swelling and PND.  Gastrointestinal: Negative for heartburn, nausea, vomiting, abdominal pain, diarrhea, constipation and blood in stool.       Colostomy  Genitourinary: Positive for frequency (incontinent of bladder). Negative for dysuria, urgency and hematuria.  Musculoskeletal: Positive for back pain, falls and joint pain. Negative for myalgias and neck pain.       [address] [address] 03/29/14 w/o apparent injury.  L hip pain with ROM and weight bearing. No neurovascular deficit.   Skin: Negative for itching  and rash.  Neurological: Negative for dizziness, tingling, tremors, sensory change, speech change, focal weakness, seizures, loss of consciousness, weakness and headaches.  Endo/Heme/Allergies: Negative for environmental allergies and polydipsia. Does not bruise/bleed easily.  Psychiatric/Behavioral: Positive for depression and memory loss. Negative for hallucinations. The patient is nervous/anxious and has insomnia.        Increased confusion. Less psychotic presentation. But still c/o psychological pain. Staff reported crying episodes x4 in the past 11 days. POA cancelled Psychiatry appointment.      Past Medical History  Diagnosis Date  . Compression fracture     t12  . IBS (irritable bowel syndrome)   . Vaginal prolapse   . TMJ (temporomandibular joint disorder)   . Anemia   . Hearing loss   . Blood in stool   . Anxiety   . Unspecified essential hypertension 11/18/2012  . Coronary atherosclerosis of native coronary artery 10/25/2012  . Long term (current) use of anticoagulants 10/25/2012  . Other pulmonary embolism and infarction 10/15/2012  . Phlebitis and thrombophlebitis of other deep vessels of lower extremities 10/15/2012  . 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 Vitamin D deficiency 09/27/2012  . Hypopotassemia 09/27/2012  . Major depressive disorder, single episode, unspecified 09/27/2012  . Blepharitis, unspecified 09/27/2012  . Diffuse cystic mastopathy 09/27/2012  . Muscle weakness (generalized) 09/27/2012  . Abnormal involuntary movements(781.0) 09/27/2012  . Edema 09/27/2012  . Unspecified urinary incontinence 09/27/2012  . Colostomy status 09/27/2012  . Cancer     Colon  . Malignant neoplasm of colon, unspecified site 09/27/2012  . DJD (degenerative joint disease)   . Degeneration of intervertebral disc, site unspecified 09/27/2012   Past Surgical History  Procedure Laterality Date  . Appendectomy    . Vaginal prolapse repair  2005 -  approximate  . Cystectomy      back  . Nasal reconstruction with septal repair  1983  . Skin cancer excision    . Arthroscopy left knee  2007  . Colonoscopy    . Total abdominal hysterectomy  1978  . Hernia repair  1980 - approximate  . Eye surgery  [phone]    cataracts bilateral  . Back surgery      mid back  . Laparoscopic sigmoid colectomy  09/20/2012    Procedure: LAPAROSCOPIC SIGMOID COLECTOMY;  Surgeon: Rolm Bookbinder, MD;  Location: [facility];  Service: General;  Laterality: N/A;  Laparoscopic possible Open Sigmoid Colectomy,Colostomy  . Colostomy  09/20/2012    Procedure: COLOSTOMY;  Surgeon: Rolm Bookbinder, MD;  Location: [facility];  Service: General;  Laterality: N/A;  . Vertebroplasty  11/03/2013    T12   Social History:   reports that she has never smoked. She has never used smokeless tobacco. She reports that she does not drink alcohol or use illicit drugs.  Family  History  Problem Relation Age of Onset  . Cancer Son     spine  . Cancer Son     Medications: Patient's Medications  New Prescriptions   No medications on file  Previous Medications   ACETAMINOPHEN (TYLENOL) 500 MG TABLET    Take 1,000 mg by mouth every 4 (four) hours as needed for pain.   ARIPIPRAZOLE (ABILIFY) 10 MG TABLET    Take 10 mg by mouth daily.   DOCUSATE SODIUM (COLACE) 100 MG CAPSULE    Take 100 mg by mouth 2 (two) times daily.   DULOXETINE (CYMBALTA) 60 MG CAPSULE    Take 60 mg by mouth daily.   ENULOSE 10 GM/15ML SOLN    40 g daily.    MEMANTINE HCL ER (NAMENDA XR) 28 MG CP24    Take 28 mg by mouth daily.   METOPROLOL TARTRATE (LOPRESSOR) 25 MG TABLET    Take 12.5 mg by mouth 2 (two) times daily.    OMEPRAZOLE (PRILOSEC) 20 MG CAPSULE    Take 20 mg by mouth daily. Take one daily   OXYCODONE (OXY IR/ROXICODONE) 5 MG IMMEDIATE RELEASE TABLET    Take 1 tablet (5 mg total) by mouth every 6 (six) hours as needed for severe pain.   POTASSIUM CHLORIDE (K-DUR,KLOR-CON) 10 MEQ TABLET    Take 10  mEq by mouth daily.  Modified Medications   No medications on file  Discontinued Medications   No medications on file     Physical Exam: Physical Exam  Constitutional: She is oriented to person, place, and time. She appears well-developed and well-nourished.  HENT:  Head: Normocephalic and atraumatic.  Eyes: Conjunctivae and EOM are normal. Pupils are equal, round, and reactive to light.  Neck: Normal range of motion. Neck supple. No JVD present. No tracheal deviation present.  Cardiovascular: Normal rate and regular rhythm.   No murmur heard. Pulmonary/Chest: Effort normal and breath sounds normal. She has no wheezes. She has no rales.  Abdominal: Soft. Bowel sounds are normal. There is no tenderness.  colostomy  Musculoskeletal: Normal range of motion. She exhibits tenderness. She exhibits no edema.  [address] [address] 03/29/14 w/o apparent injury.  L hip pain with ROM and weight bearing. No neurovascular deficit.     Lymphadenopathy:    She has no cervical adenopathy.  Neurological: She is alert and oriented to person, place, and time. She has normal reflexes. No cranial nerve deficit. She exhibits normal muscle tone. Coordination normal.  Skin: Skin is warm and dry. No rash noted.  Psychiatric: Her affect is inappropriate. Her speech is delayed. She is not agitated, not aggressive, not slowed, not withdrawn and not actively hallucinating. Thought content is not paranoid and not delusional. Cognition and memory are impaired. She expresses inappropriate judgment. She does not express impulsivity. She exhibits a depressed mood. She exhibits abnormal recent memory.  Pulled off colostomy bag and milking the stoma 09/21/13. Doesn't feel like to eat or doing anything.    (type .physexam) Filed Vitals:   05/15/14 1612  BP: 140/72  Pulse: 93  Temp: 98.4 F (36.9 C)  TempSrc: Tympanic  Resp: 18      Labs reviewed: Basic Metabolic Panel:  Recent Labs  10/12/13  05/04/14 0715 05/05/14 0404  05/07/14  NA 141  < > 139 142 140  K 4.0  < > 3.8 4.3 3.8  CL  --   --  101 103  --   CO2  --   --  27 30  --  GLUCOSE  --   --  118* 123*  --   BUN 18  < > 19 16 18   CREATININE 0.7  < > 0.72 0.81 0.8  CALCIUM  --   --  9.2 9.0  --   MG  --   --   --  1.7  --   PHOS  --   --   --  3.5  --   TSH 1.98  --   --   --   --   < > = values in this interval not displayed. Liver Function Tests:  Recent Labs  04/23/14 05/05/14 0404 05/07/14  AST 10* 11 8*  ALT 8 8 8   ALKPHOS 61 74 68  BILITOT  --  0.8  --   PROT  --  5.8*  --   ALBUMIN  --  2.7*  --    CBC:  Recent Labs  06/05/13 1019  05/04/14 0715 05/05/14 0404 05/06/14 0516 05/07/14  WBC 7.5  < > 10.9* 9.0 9.1 9.3  NEUTROABS 3.6  --  8.0*  --   --   --   HGB 11.2*  < > 11.4* 9.6* 10.1* 9.9*  HCT 33.2*  < > 34.4* 30.2* 30.6* 29*  MCV 99.6  --  89.6 90.7 88.7  --   PLT 254  < > 235 160 152 150  < > = values in this interval not displayed.  Past Procedures:  11/03/13 X-ray T spine: marked vertebral body compression deformity at the approximate T12 level with 90% loss of body height. The patient is status post vertebroplasty at T12.   02/06/14 CXR no active cardiopulmonary disease.  Assessment/Plan FTT (failure to thrive) in adult Eats less and sleeps more in setting of dementia, depression, colon cancer, and recent L hip fx. Hospice consult. Dc Colace and Symbalta-difficulty swallowing in the past a few days.   Colon cancer s/p surgery and chemotherapy. Colostomy.    Severe major depression with psychotic features No longer c/o psychological pain. Staff reported crying episodes x4 in the past 11 days prior to Abilify 10mg  04/13/14-too soon to eval. The patient was seen well dressed in her room and versed calmly with lots of confusion-she told me her deceased husband was out there working today.  dc Cymbalta 60mg  daily-unable to take orally in the past a few days and will continue Prn Lorazepam 0.5mg  q6hr available to her.   Observe for negative side effects.     Memory loss takes Namenda 28nmg. Usual state of mentation. Likely vascular dementia is nature in setting of hx PE/DVT. SLUMS 05/04/14 4/30  GERD (gastroesophageal reflux disease) Stable, takes Omeprazole 20mg      Urinary tract infection, site not specified UTI - treated with  Ceftriaxone, cultures with strep viridans, likely contaminant. Initial UA not impressive with 2-6 WBC, negative Nitrates, patient afebrile and without significant leukocytosis.    Hypokalemia 02/09/14 Serum K 3.4 02/07/14. Kcl 2meq daily. 02/15/14 K 4.2 03/19/14 K 4.2 05/07/14 K 3.8   Acetabular fracture Left acetabular fracture - ortho surgery consulted, non operative management, will have to be bed to chair transfer for the next 8-12 weeks per ortho. Dr. Marcelino Scot will have her follow up in his office in ~2 weeks and will repeat XRay at that time. Will repeat X-ray L hip at [facility].     A-fib A fib - appears to be new, rate controlled, not a candidate for anticoagulation currently.EKG   Left hip pain Pain is reasonably  controlled with Oxycodone 5mg  am and continue with Oxycodone 5mg  q6h prn.      Family/ Staff Communication: observe the patient.   Goals of Care: SNF Hospice Service.   Labs/tests ordered: X-ray L hip

## 2014-05-15 NOTE — Assessment & Plan Note (Signed)
02/09/14 Serum K 3.4 02/07/14. Kcl 39meq daily. 02/15/14 K 4.2 03/19/14 K 4.2 05/07/14 K 3.8

## 2014-05-15 NOTE — Assessment & Plan Note (Signed)
Eats less and sleeps more in setting of dementia, depression, colon cancer, and recent L hip fx. Hospice consult. Dc Colace and Symbalta-difficulty swallowing in the past a few days.

## 2014-05-15 NOTE — Assessment & Plan Note (Signed)
takes Namenda 28nmg. Usual state of mentation. Likely vascular dementia is nature in setting of hx PE/DVT. SLUMS 05/04/14 4/30

## 2014-05-15 NOTE — Assessment & Plan Note (Signed)
No longer c/o psychological pain. Staff reported crying episodes x4 in the past 11 days prior to Abilify 10mg  04/13/14-too soon to eval. The patient was seen well dressed in her room and versed calmly with lots of confusion-she told me her deceased husband was out there working today.  dc Cymbalta 60mg  daily-unable to take orally in the past a few days and will continue Prn Lorazepam 0.5mg  q6hr available to her.  Observe for negative side effects.

## 2014-05-15 NOTE — Assessment & Plan Note (Signed)
A fib - appears to be new, rate controlled, not a candidate for anticoagulation currently.EKG

## 2014-05-15 NOTE — Assessment & Plan Note (Signed)
Pain is reasonably controlled with Oxycodone 5mg  am and continue with Oxycodone 5mg  q6h prn.

## 2014-05-15 NOTE — Assessment & Plan Note (Signed)
Stable, takes Omeprazole 20mg 

## 2014-05-15 NOTE — Assessment & Plan Note (Signed)
UTI - treated with  Ceftriaxone, cultures with strep viridans, likely contaminant. Initial UA not impressive with 2-6 WBC, negative Nitrates, patient afebrile and without significant leukocytosis.

## 2014-05-15 NOTE — Assessment & Plan Note (Signed)
s/p surgery and chemotherapy. Colostomy.

## 2014-05-15 NOTE — Assessment & Plan Note (Signed)
Left acetabular fracture - ortho surgery consulted, non operative management, will have to be bed to chair transfer for the next 8-12 weeks per ortho. Dr. Marcelino Scot will have her follow up in his office in ~2 weeks and will repeat XRay at that time. Will repeat X-ray L hip at [facility].

## 2014-05-17 LAB — CBC AND DIFFERENTIAL
HCT: 29 % — AB (ref 36–46)
Hemoglobin: 9.6 g/dL — AB (ref 12.0–16.0)
Platelets: 335 10*3/uL (ref 150–399)
WBC: 11.3 10*3/mL

## 2014-05-18 ENCOUNTER — Encounter: Payer: Self-pay | Admitting: Nurse Practitioner

## 2014-05-18 ENCOUNTER — Other Ambulatory Visit: Payer: Self-pay | Admitting: Nurse Practitioner

## 2014-07-03 ENCOUNTER — Encounter: Payer: Self-pay | Admitting: Nurse Practitioner

## 2014-07-03 ENCOUNTER — Non-Acute Institutional Stay (SKILLED_NURSING_FACILITY): Payer: Medicare Other | Admitting: Nurse Practitioner

## 2014-07-03 DIAGNOSIS — F323 Major depressive disorder, single episode, severe with psychotic features: Secondary | ICD-10-CM

## 2014-07-03 DIAGNOSIS — R413 Other amnesia: Secondary | ICD-10-CM

## 2014-07-03 DIAGNOSIS — I482 Chronic atrial fibrillation, unspecified: Secondary | ICD-10-CM

## 2014-07-03 DIAGNOSIS — R627 Adult failure to thrive: Secondary | ICD-10-CM

## 2014-07-03 DIAGNOSIS — C189 Malignant neoplasm of colon, unspecified: Secondary | ICD-10-CM

## 2014-07-03 DIAGNOSIS — D500 Iron deficiency anemia secondary to blood loss (chronic): Secondary | ICD-10-CM

## 2014-07-03 DIAGNOSIS — S32402D Unspecified fracture of left acetabulum, subsequent encounter for fracture with routine healing: Secondary | ICD-10-CM

## 2014-07-03 DIAGNOSIS — L8990 Pressure ulcer of unspecified site, unspecified stage: Secondary | ICD-10-CM

## 2014-07-03 DIAGNOSIS — I10 Essential (primary) hypertension: Secondary | ICD-10-CM

## 2014-07-03 DIAGNOSIS — K5900 Constipation, unspecified: Secondary | ICD-10-CM

## 2014-07-03 DIAGNOSIS — K219 Gastro-esophageal reflux disease without esophagitis: Secondary | ICD-10-CM

## 2014-07-03 NOTE — Assessment & Plan Note (Signed)
10/10/13 Namenda.  05/04/14 SLUMS 4/30 05/15/14 dc Cymbalta-unable to take orally.

## 2014-07-03 NOTE — Assessment & Plan Note (Signed)
Continue to decline.  

## 2014-07-03 NOTE — Progress Notes (Signed)
Patient ID: Erin Hansen, female   DOB: Apr 06, 1926, 80 y.o.   MRN: [id number]   Code Status: DNR  Allergies  Allergen Reactions  . Codeine Nausea Only    Chief Complaint  Patient presents with  . Medical Management of Chronic Issues  . Acute Visit    R buttock decubitus    HPI: Patient is a 80 y.o. female seen in the SNF at [facility] today for  evaluation of right buttock/left heel decubitus  and chronic medical conditions   Hospitalized 05/04/2014-05/06/2014 Fall at nursing home Left acetabular fracture Per orthopedic surgery, continue bed to chair transfers for the next 8-12 weeks. Follow up with Dr. Marcelino Scot in 2 weeks  ED she underwent a CT pelvis which showed left acetabulum fracture and associated hemorrhage in her left pelvis.   Problem List Items Addressed This Visit   Severe major depression with psychotic features     No longer c/o psychological pain. Staff reported crying episodes x4 in the past 11 days prior to Abilify 10mg  04/13/14. Takes  Prn Lorazepam 0.5mg  q6hr. Observe for negative side effects. She said occasionally she doesn't sleep well at night.  Cymbalta 30mg  daily since 11/18/13  Cymbalta 60mg  daily 01/09/14-off due due to difficulty of swallowing.  02/27/14 Abilify 5mg  04/13/14 Abilify 10mg  daily      Memory loss     10/10/13 Namenda.  05/04/14 SLUMS 4/30 05/15/14 dc Cymbalta-unable to take orally.      GERD (gastroesophageal reflux disease)     Stable, takes Omeprazole 20mg       FTT (failure to thrive) in adult     Continue to decline.     Essential hypertension     Controlled, continue Metoprolol 12.5mg  bid.       Decubitus skin ulcer - Primary     Stage II R buttock. Left heel is covered with black eschar. Pressure reduction is must. ABT is indicated if s/s of infection develop    Constipation     Managed. Continue Colace 100mg  bid and Enulose 60mg  daily      Colon cancer     s/p surgery and chemotherapy. Colostomy.       Anemia   she is on Xarelto for history of PE, now with pelvic hemorrhage due to fall. Hold Xarelto.  05/17/14 Hgb 9.6     Acetabular fracture     Left acetabular fracture - ortho surgery consulted, non operative management, will have to be bed to chair transfer for the next 8-12 weeks per ortho. Dr. Marcelino Scot. Takes Oxycodone 5mg  qam and q6hr prn.        A-fib      fib - appears to be new, rate controlled, not a candidate for anticoagulation currently         Review of Systems:  Review of Systems  Constitutional: Positive for malaise/fatigue. Negative for fever, chills, weight loss and diaphoresis.  HENT: Positive for hearing loss. Negative for congestion, ear discharge and sore throat.   Eyes: Negative for blurred vision, pain, discharge and redness.  Respiratory: Positive for cough. Negative for sputum production, shortness of breath and wheezing.   Cardiovascular: Negative for chest pain, palpitations, orthopnea, claudication, leg swelling and PND.  Gastrointestinal: Negative for heartburn, nausea, vomiting, abdominal pain, diarrhea, constipation and blood in stool.       Colostomy  Genitourinary: Positive for frequency (incontinent of bladder). Negative for dysuria, urgency and hematuria.  Musculoskeletal: Positive for back pain, falls and joint pain. Negative for myalgias  and neck pain.       [place] [address] 03/29/14 w/o apparent injury.  L hip pain with ROM and weight bearing. No neurovascular deficit.   Skin: Negative for itching and rash.       R buttock and left heel pressure ulcer-not infected-pressure reduction warranted.   Neurological: Negative for dizziness, tingling, tremors, sensory change, speech change, focal weakness, seizures, loss of consciousness, weakness and headaches.  Endo/Heme/Allergies: Negative for environmental allergies and polydipsia. Does not bruise/bleed easily.  Psychiatric/Behavioral: Positive for depression and memory loss. Negative for hallucinations. The patient is  nervous/anxious and has insomnia.        Increased confusion. Less psychotic presentation. But still c/o psychological pain. Staff reported crying episodes x4 in the past 11 days. POA cancelled Psychiatry appointment.      Past Medical History  Diagnosis Date  . Compression fracture     t12  . IBS (irritable bowel syndrome)   . Vaginal prolapse   . TMJ (temporomandibular joint disorder)   . Anemia   . Hearing loss   . Blood in stool   . Anxiety   . Unspecified essential hypertension 11/18/2012  . Coronary atherosclerosis of native coronary artery 10/25/2012  . Long term (current) use of anticoagulants 10/25/2012  . Other pulmonary embolism and infarction 10/15/2012  . Phlebitis and thrombophlebitis of other deep vessels of lower extremities 10/15/2012  . 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 Vitamin D deficiency 09/27/2012  . Hypopotassemia 09/27/2012  . Major depressive disorder, single episode, unspecified 09/27/2012  . Blepharitis, unspecified 09/27/2012  . Diffuse cystic mastopathy 09/27/2012  . Muscle weakness (generalized) 09/27/2012  . Abnormal involuntary movements(781.0) 09/27/2012  . Edema 09/27/2012  . Unspecified urinary incontinence 09/27/2012  . Colostomy status 09/27/2012  . Cancer     Colon  . Malignant neoplasm of colon, unspecified site 09/27/2012  . DJD (degenerative joint disease)   . Degeneration of intervertebral disc, site unspecified 09/27/2012   Past Surgical History  Procedure Laterality Date  . Appendectomy    . Vaginal prolapse repair  2005 - approximate  . Cystectomy      back  . Nasal reconstruction with septal repair  1983  . Skin cancer excision    . Arthroscopy left knee  2007  . Colonoscopy    . Total abdominal hysterectomy  1978  . Hernia repair  1980 - approximate  . Eye surgery  [phone]    cataracts bilateral  . Back surgery      mid back  . Laparoscopic sigmoid colectomy  09/20/2012    Procedure:  LAPAROSCOPIC SIGMOID COLECTOMY;  Surgeon: Rolm Bookbinder, MD;  Location: [facility];  Service: General;  Laterality: N/A;  Laparoscopic possible Open Sigmoid Colectomy,Colostomy  . Colostomy  09/20/2012    Procedure: COLOSTOMY;  Surgeon: Rolm Bookbinder, MD;  Location: [facility];  Service: General;  Laterality: N/A;  . Vertebroplasty  11/03/2013    T12   Social History:   reports that she has never smoked. She has never used smokeless tobacco. She reports that she does not drink alcohol or use illicit drugs.  Family History  Problem Relation Age of Onset  . Cancer Son     spine  . Cancer Son     Medications: Patient's Medications  New Prescriptions   No medications on file  Previous Medications   ACETAMINOPHEN (TYLENOL) 500 MG TABLET    Take 1,000 mg by mouth every 4 (four) hours as needed for pain.  ARIPIPRAZOLE (ABILIFY) 10 MG TABLET    Take 10 mg by mouth daily.   DOCUSATE SODIUM (COLACE) 100 MG CAPSULE    Take 100 mg by mouth 2 (two) times daily.   DULOXETINE (CYMBALTA) 60 MG CAPSULE    Take 60 mg by mouth daily.   ENULOSE 10 GM/15ML SOLN    40 g daily.    MEMANTINE HCL ER (NAMENDA XR) 28 MG CP24    Take 28 mg by mouth daily.   METOPROLOL TARTRATE (LOPRESSOR) 25 MG TABLET    Take 12.5 mg by mouth 2 (two) times daily.    OMEPRAZOLE (PRILOSEC) 20 MG CAPSULE    Take 20 mg by mouth daily. Take one daily   OXYCODONE (OXY IR/ROXICODONE) 5 MG IMMEDIATE RELEASE TABLET    Take 1 tablet (5 mg total) by mouth every 6 (six) hours as needed for severe pain.   POTASSIUM CHLORIDE (K-DUR,KLOR-CON) 10 MEQ TABLET    Take 10 mEq by mouth daily.  Modified Medications   No medications on file  Discontinued Medications   No medications on file     Physical Exam: Physical Exam  Constitutional: She is oriented to person, place, and time. She appears well-developed and well-nourished.  Lethargic.   HENT:  Head: Normocephalic and atraumatic.  Eyes: Conjunctivae and EOM are normal. Pupils are  equal, round, and reactive to light.  Neck: Normal range of motion. Neck supple. No JVD present. No tracheal deviation present.  Cardiovascular: Normal rate and regular rhythm.   No murmur heard. Pulmonary/Chest: Effort normal and breath sounds normal. She has no wheezes. She has no rales.  Abdominal: Soft. Bowel sounds are normal. There is no tenderness.  colostomy  Musculoskeletal: Normal range of motion. She exhibits tenderness. She exhibits no edema.  [place] [address] 03/29/14 w/o apparent injury.  L hip pain with ROM and weight bearing. No neurovascular deficit.     Lymphadenopathy:    She has no cervical adenopathy.  Neurological: She is alert and oriented to person, place, and time. She has normal reflexes. No cranial nerve deficit. She exhibits normal muscle tone. Coordination normal.  Skin: Skin is warm and dry. No rash noted.  R buttock and left heel pressure ulcer-not infected-pressure reduction warranted.    Psychiatric: Her affect is inappropriate. Her speech is delayed. She is not agitated, not aggressive, not slowed, not withdrawn and not actively hallucinating. Thought content is not paranoid and not delusional. Cognition and memory are impaired. She expresses inappropriate judgment. She does not express impulsivity. She exhibits a depressed mood. She exhibits abnormal recent memory.  Pulled off colostomy bag and milking the stoma 09/21/13. Doesn't feel like to eat or doing anything.  Mentally deteriorating gradually.    (type .physexam) Filed Vitals:   07/03/14 1109  BP: 118/69  Pulse: 87  Temp: 97.8 F (36.6 C)  TempSrc: Tympanic  Resp: 18      Labs reviewed: Basic Metabolic Panel:  Recent Labs  10/12/13  05/04/14 0715 05/05/14 0404 05/07/14  NA 141  < > 139 142 140  K 4.0  < > 3.8 4.3 3.8  CL  --   --  101 103  --   CO2  --   --  27 30  --   GLUCOSE  --   --  118* 123*  --   BUN 18  < > 19 16 18   CREATININE 0.7  < > 0.72 0.81 0.8  CALCIUM  --   --  9.2 9.0  --  MG  --   --   --  1.7  --   PHOS  --   --   --  3.5  --   TSH 1.98  --   --   --   --   < > = values in this interval not displayed. Liver Function Tests:  Recent Labs  04/23/14 05/05/14 0404 05/07/14  AST 10* 11 8*  ALT 8 8 8   ALKPHOS 61 74 68  BILITOT  --  0.8  --   PROT  --  5.8*  --   ALBUMIN  --  2.7*  --    CBC:  Recent Labs  05/04/14 0715 05/05/14 0404 05/06/14 0516 05/07/14 05/17/14  WBC 10.9* 9.0 9.1 9.3 11.3  NEUTROABS 8.0*  --   --   --   --   HGB 11.4* 9.6* 10.1* 9.9* 9.6*  HCT 34.4* 30.2* 30.6* 29* 29*  MCV 89.6 90.7 88.7  --   --   PLT 235 160 152 150 335    Past Procedures:  11/03/13 X-ray T spine: marked vertebral body compression deformity at the approximate T12 level with 90% loss of body height. The patient is status post vertebroplasty at T12.   02/06/14 CXR no active cardiopulmonary disease.  Assessment/Plan Decubitus skin ulcer Stage II R buttock. Left heel is covered with black eschar. Pressure reduction is must. ABT is indicated if s/s of infection develop  A-fib  fib - appears to be new, rate controlled, not a candidate for anticoagulation currently    Acetabular fracture Left acetabular fracture - ortho surgery consulted, non operative management, will have to be bed to chair transfer for the next 8-12 weeks per ortho. Dr. Marcelino Scot. Takes Oxycodone 5mg  qam and q6hr prn.      Anemia she is on Xarelto for history of PE, now with pelvic hemorrhage due to fall. Hold Xarelto.  05/17/14 Hgb 9.6   Colon cancer s/p surgery and chemotherapy. Colostomy.     Essential hypertension Controlled, continue Metoprolol 12.5mg  bid.     FTT (failure to thrive) in adult Continue to decline.   GERD (gastroesophageal reflux disease) Stable, takes Omeprazole 20mg     Constipation Managed. Continue Colace 100mg  bid and Enulose 60mg  daily    Severe major depression with psychotic features No longer c/o psychological pain. Staff reported  crying episodes x4 in the past 11 days prior to Abilify 10mg  04/13/14. Takes  Prn Lorazepam 0.5mg  q6hr. Observe for negative side effects. She said occasionally she doesn't sleep well at night.  Cymbalta 30mg  daily since 11/18/13  Cymbalta 60mg  daily 01/09/14-off due due to difficulty of swallowing.  02/27/14 Abilify 5mg  04/13/14 Abilify 10mg  daily    Memory loss 10/10/13 Namenda.  05/04/14 SLUMS 4/30 05/15/14 dc Cymbalta-unable to take orally.      Family/ Staff Communication: observe the patient.   Goals of Care: SNF Hospice Service.   Labs/tests ordered: none

## 2014-07-03 NOTE — Assessment & Plan Note (Addendum)
Left acetabular fracture - ortho surgery consulted, non operative management, will have to be bed to chair transfer for the next 8-12 weeks per ortho. Dr. Marcelino Scot. Takes Oxycodone 5mg  qam and q6hr prn.

## 2014-07-03 NOTE — Assessment & Plan Note (Signed)
s/p surgery and chemotherapy. Colostomy.

## 2014-07-03 NOTE — Assessment & Plan Note (Signed)
Stage II R buttock. Left heel is covered with black eschar. Pressure reduction is must. ABT is indicated if s/s of infection develop

## 2014-07-03 NOTE — Assessment & Plan Note (Signed)
fib - appears to be new, rate controlled, not a candidate for anticoagulation currently

## 2014-07-03 NOTE — Assessment & Plan Note (Signed)
Controlled, continue Metoprolol 12.5mg bid.  

## 2014-07-03 NOTE — Assessment & Plan Note (Addendum)
No longer c/o psychological pain. Staff reported crying episodes x4 in the past 11 days prior to Abilify 10mg  04/13/14. Takes  Prn Lorazepam 0.5mg  q6hr. Observe for negative side effects. She said occasionally she doesn't sleep well at night.  Cymbalta 30mg  daily since 11/18/13  Cymbalta 60mg  daily 01/09/14-off due due to difficulty of swallowing.  02/27/14 Abilify 5mg  04/13/14 Abilify 10mg  daily

## 2014-07-03 NOTE — Assessment & Plan Note (Signed)
she is on Xarelto for history of PE, now with pelvic hemorrhage due to fall. Hold Xarelto.  05/17/14 Hgb 9.6

## 2014-07-03 NOTE — Assessment & Plan Note (Signed)
Stable, takes Omeprazole 20mg 

## 2014-07-03 NOTE — Assessment & Plan Note (Signed)
Managed. Continue Colace 100mg  bid and Enulose 60mg  daily

## 2014-07-30 ENCOUNTER — Telehealth: Payer: Self-pay | Admitting: Oncology

## 2014-07-30 NOTE — Telephone Encounter (Signed)
Pt's son called to cancel pt's apt, pt is deceased..... KJ

## 2014-08-07 DEATH — deceased

## 2014-09-25 ENCOUNTER — Ambulatory Visit: Payer: Medicare Other | Admitting: Oncology

## 2014-09-25 ENCOUNTER — Other Ambulatory Visit: Payer: Medicare Other

## 2015-01-01 IMAGING — CT CT ABD-PELV W/ CM
1 of 4 series · 13 of 32 positions shown, 18 images · IV contrast (OMNIPAQUE 300)
Comparison: 08/26/2012

CLINICAL DATA: Right-sided abdominal pain.  Post colostomy [DATE].

CT ABDOMEN AND PELVIS WITH CONTRAST
TECHNIQUE: Multidetector CT imaging of the abdomen and pelvis was
performed following the standard protocol during bolus
administration of intravenous contrast.
Contrast: 100mL OMNIPAQUE IOHEXOL 300 MG/ML  SOLN

[Series 2: abd/pel with · axial · 0.74mm/px · z∈[-302,+78]mm · 13 of 88 slices shown, 18 images]
[im 6/88  soft-tissue]
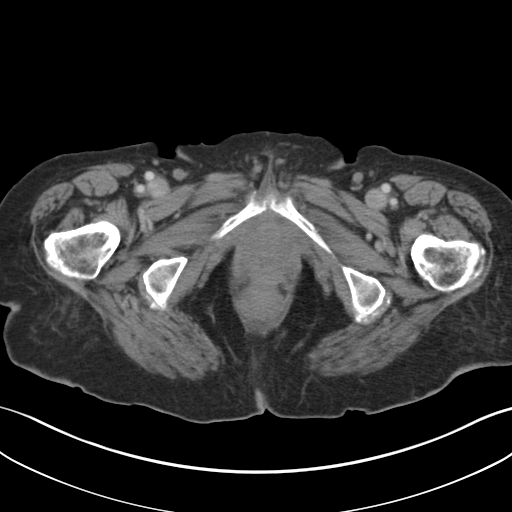
[im 6/88  bone]
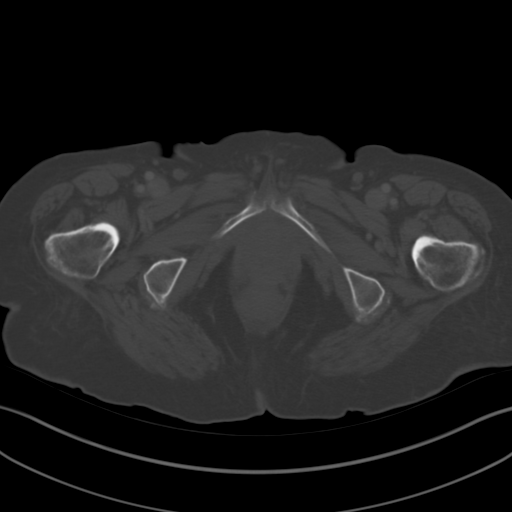
[im 16/88  soft-tissue]
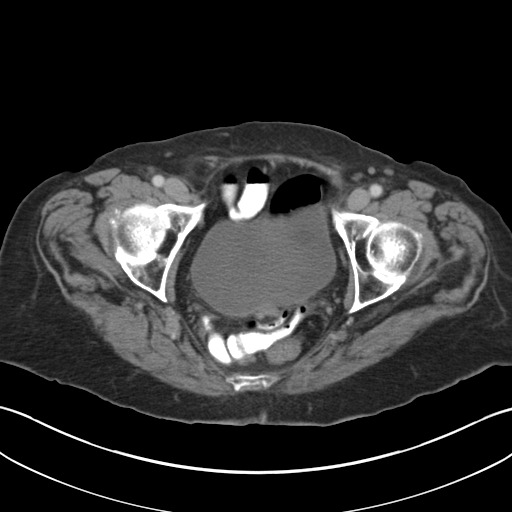
[im 21/88  soft-tissue]
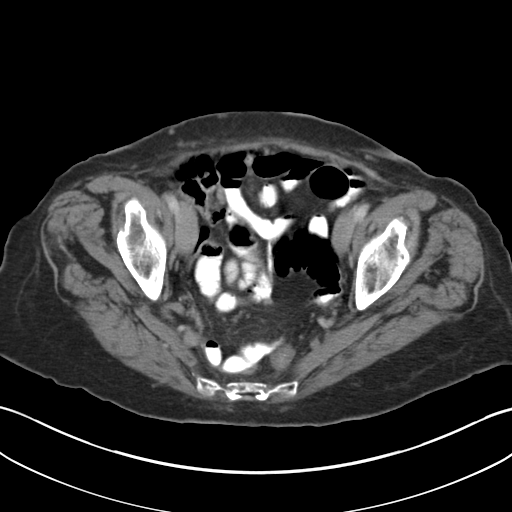
[im 26/88  soft-tissue]
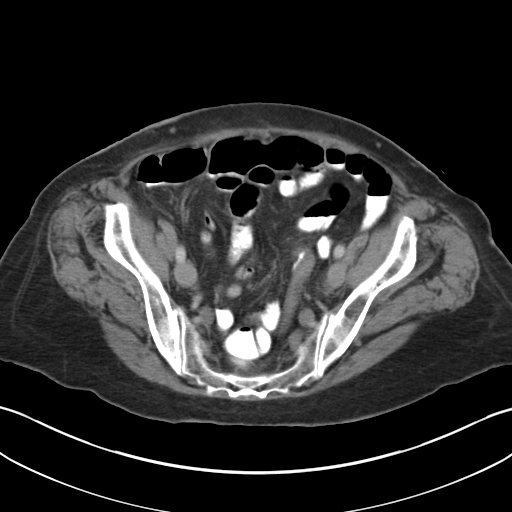
[im 36/88  soft-tissue]
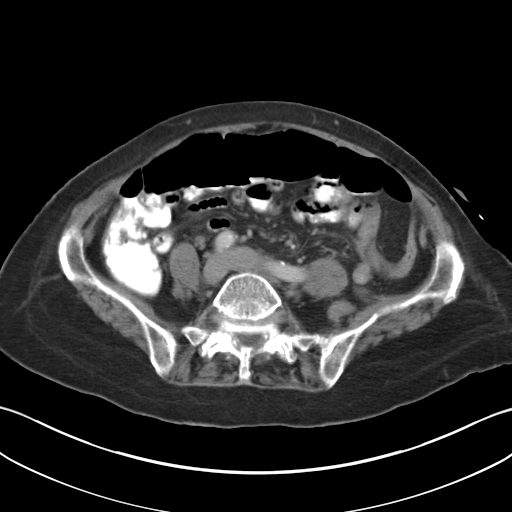
[im 41/88  soft-tissue]
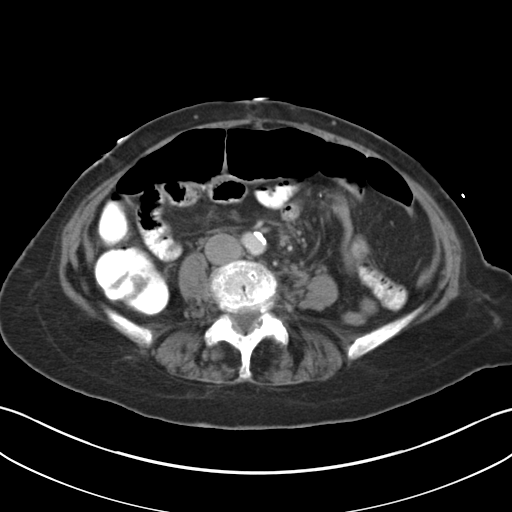
[im 47/88  soft-tissue]
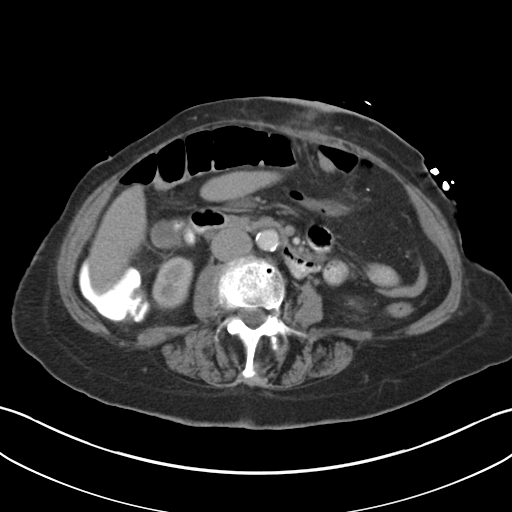
[im 57/88  soft-tissue]
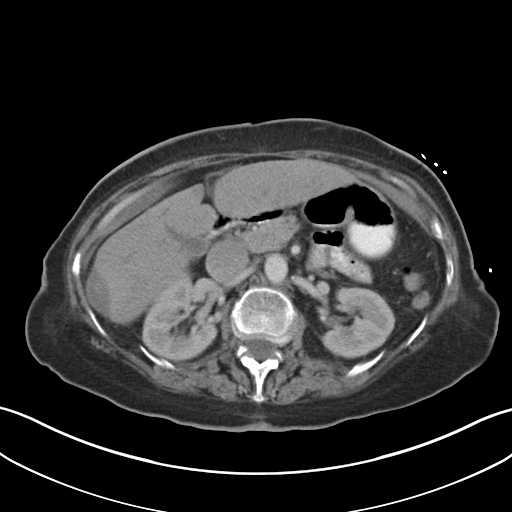
[im 62/88  soft-tissue]
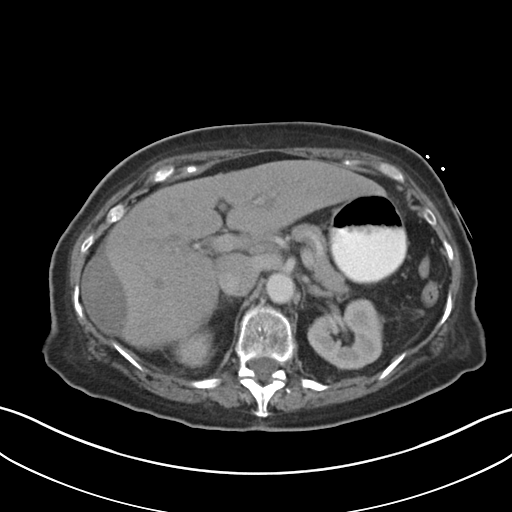
[im 62/88  bone]
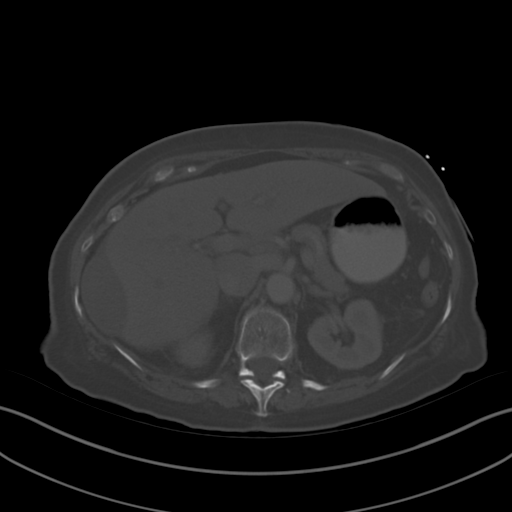
[im 67/88  soft-tissue]
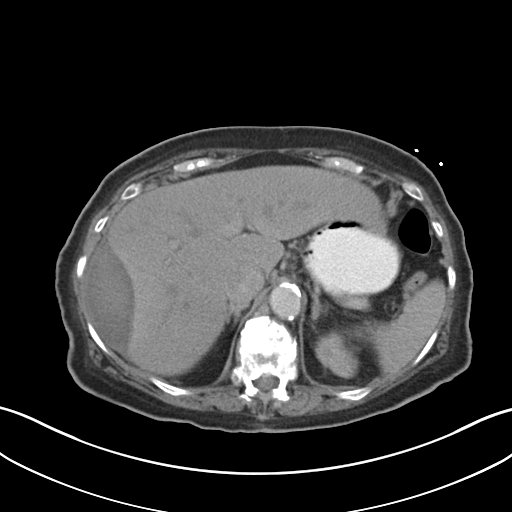
[im 67/88  lung]
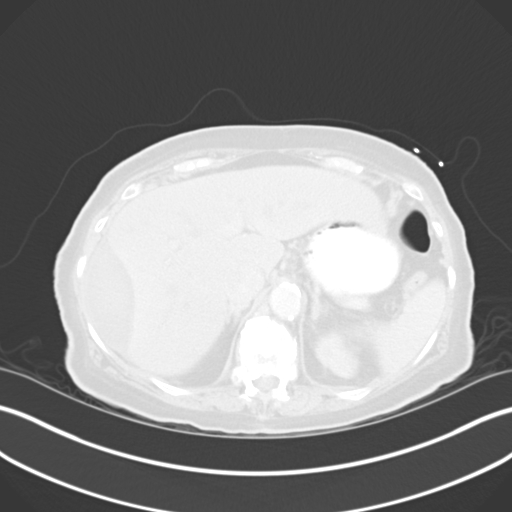
[im 72/88  lung]
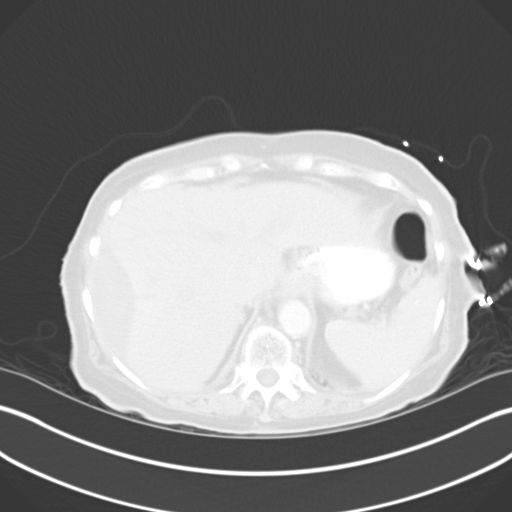
[im 77/88  soft-tissue]
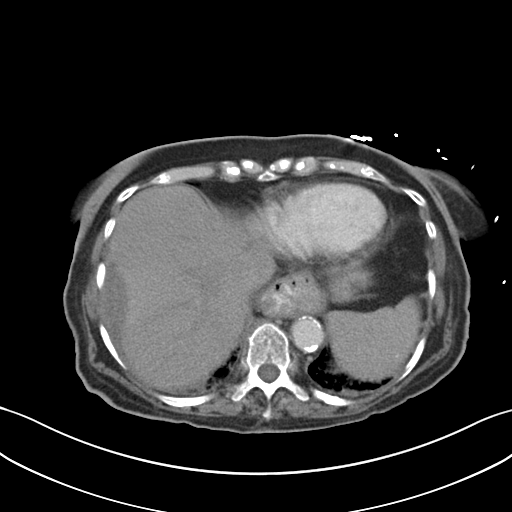
[im 77/88  lung]
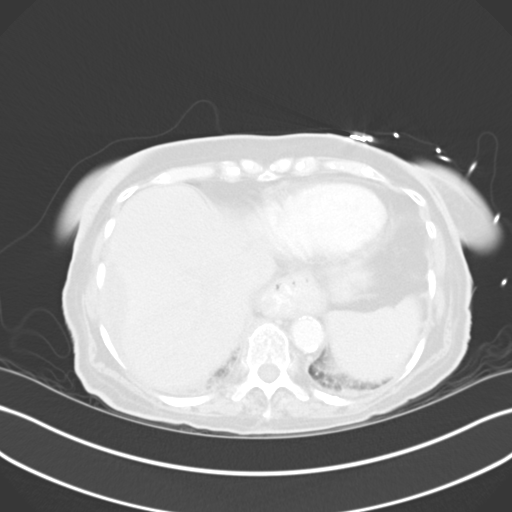
[im 82/88  soft-tissue]
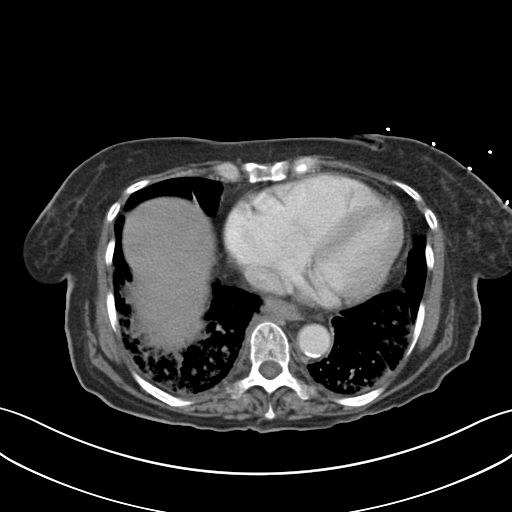
[im 82/88  lung]
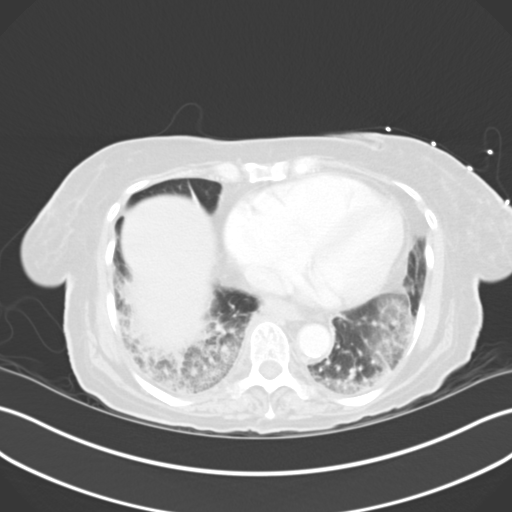

[13 of 32 positions shown; findings below may reference images not displayed]

FINDINGS: Airspace infiltration in both lung bases, greater on the
right, suggesting pneumonia.  Minimal bilateral pleural effusions.
Cardiac enlargement.  Moderate sized esophageal hiatal hernia.

Since the previous study, there is interval development of a
subcapsular hematoma in the right lobe of the liver.  This measures
about 3.5 x 8.4 cm.  This is adjacent to the previously
demonstrated focal liver lesion.  Perhaps there has been interval
biopsy of this lesion leading to the hematoma.  If there has been
no intervention, then hemorrhage arising from the lesion could have
this appearance.  Calcified granulomas in the spleen.  The
gallbladder, pancreas, adrenal glands, kidneys, and retroperitoneal
lymph nodes are unremarkable.  Calcification of the abdominal aorta
without aneurysm.  There have been interval postoperative changes
with placement of a left upper quadrant ostomy which appears to
connect to the descending colon.  The stomach, small bowel, and
colon are not abnormally distended.  No free air or free fluid in
the abdomen.

Pelvis:  The bladder wall is not thickened.  The uterus appears to
be surgically absent.  The rectosigmoid colon is decompressed and
the previously identified mass appears to been resected.  No free
or loculated pelvic fluid collections.  No significant pelvic
lymphadenopathy.  The appendix is not identified.  Scoliosis and
degenerative changes in the lumbar spine.  Degenerative changes in
the hips.  Compression and kyphoplasty changes at T12.
Spondylolysis and mild spondylolisthesis at L5-S1.
IMPRESSION: Interval development of a right hepatic subcapsular hematoma which
could arisen from biopsy of the right hepatic lesion or from
rupture of this lesion.  Correlation with surgical history is
recommended.  Interval postoperative changes with resection of a
rectosigmoid mass lesion and placement of the descending colostomy.
Infiltrates in both lung bases suggesting pneumonia.

## 2015-01-02 IMAGING — US US ABDOMEN COMPLETE
1 series · 14 of 25 positions shown · non-contrast
Comparison: CT abdomen pelvis dated 10/08/2012.  Biopsy ultrasound
dated 09/14/2012.

CLINICAL DATA: Right upper quadrant pain

COMPLETE ABDOMINAL ULTRASOUND

[Series 1: us abdomen complete · 0.30mm/px · 14 of 72 slices shown]
[im 1/72]
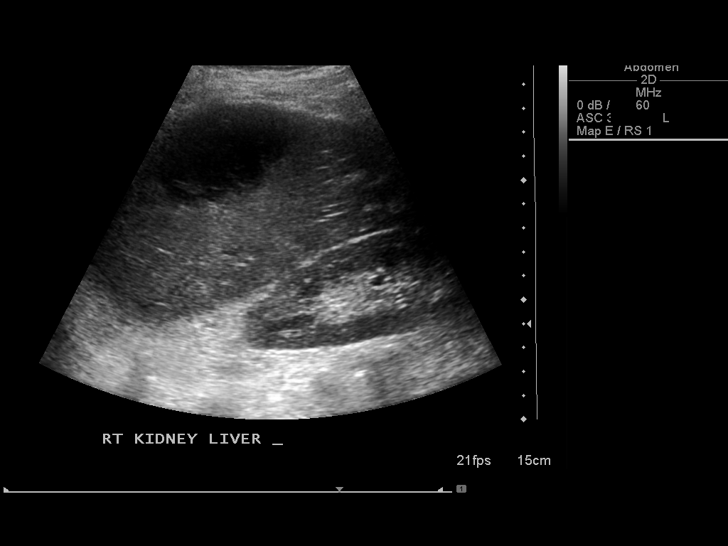
[im 6/72]
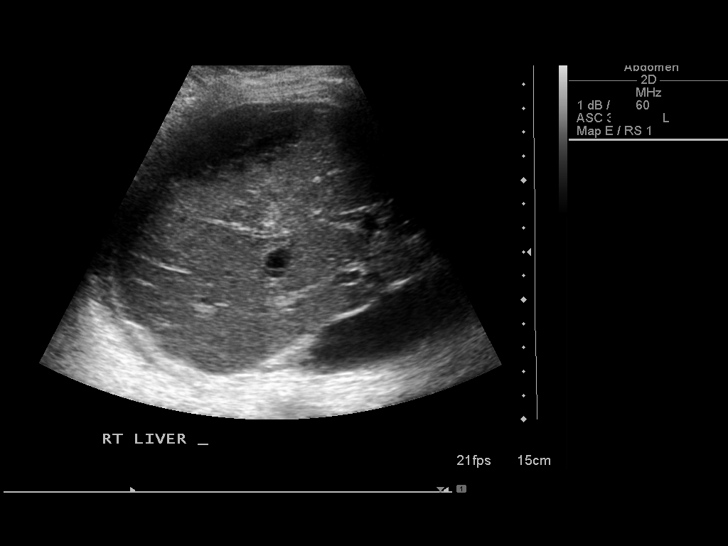
[im 12/72]
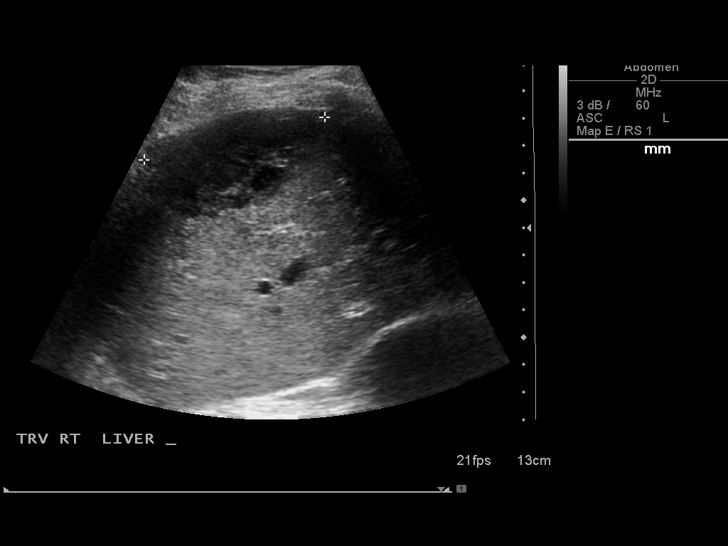
[im 18/72]
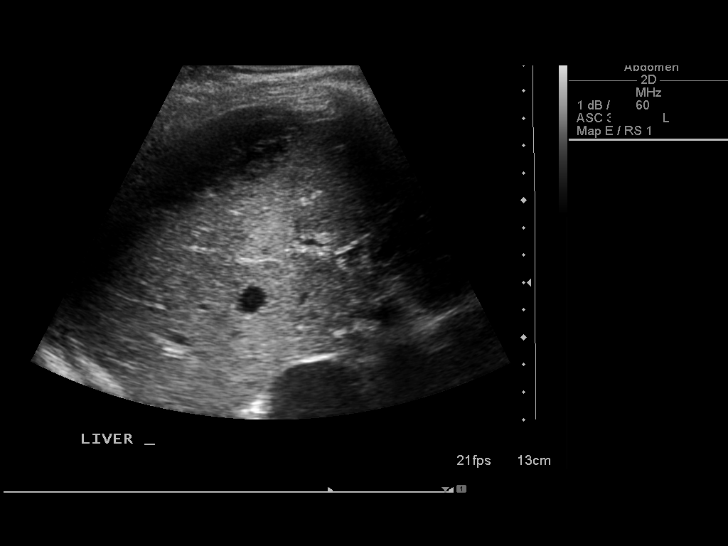
[im 24/72]
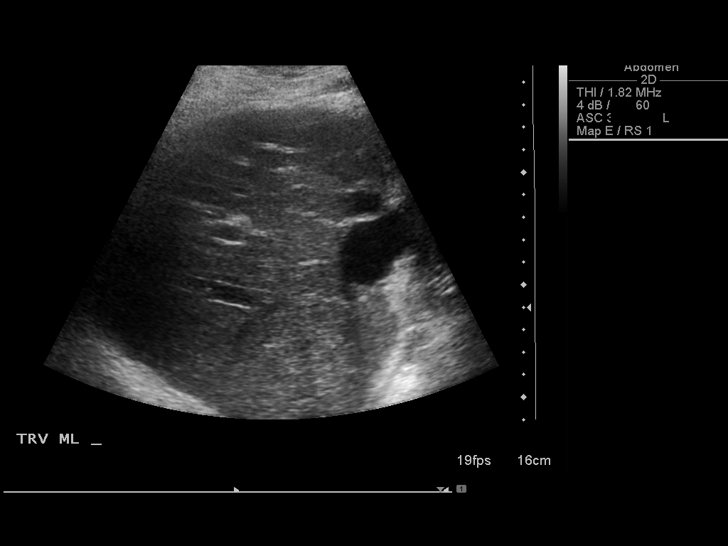
[im 27/72]
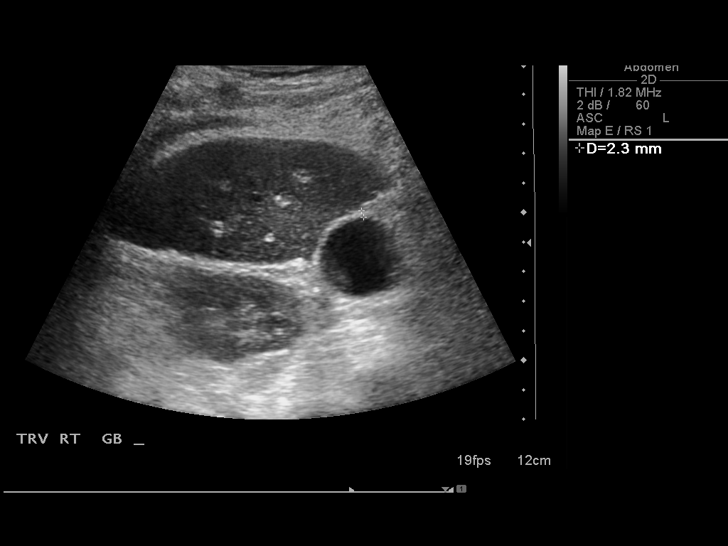
[im 33/72]
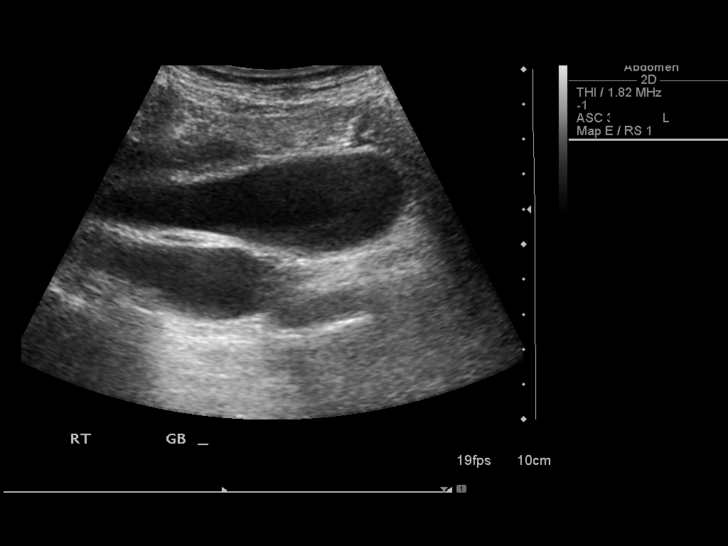
[im 39/72]
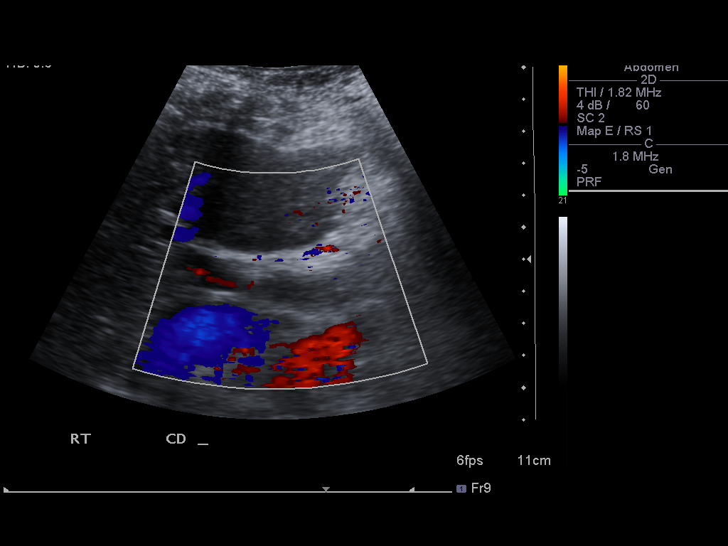
[im 45/72]
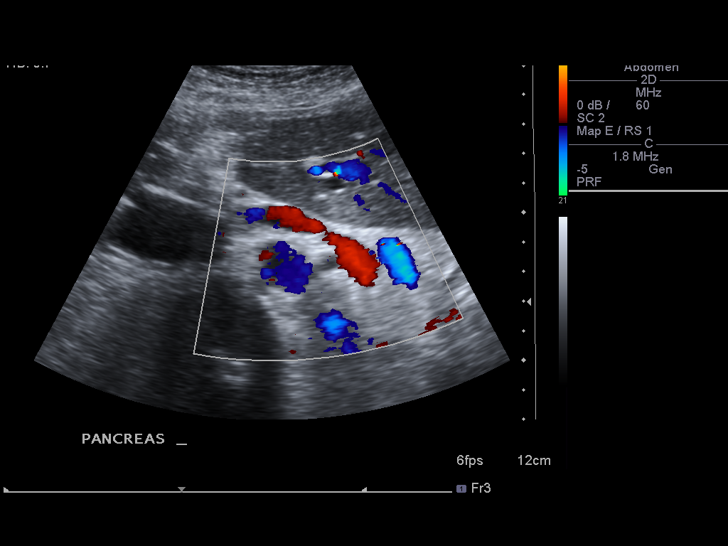
[im 48/72]
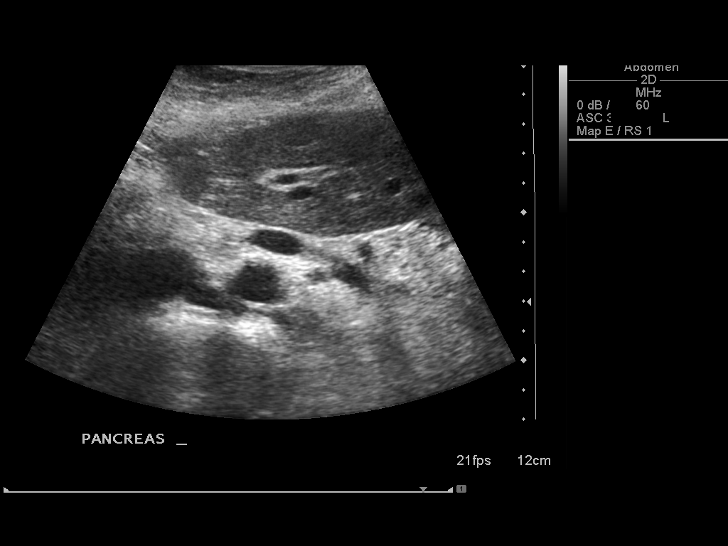
[im 54/72]
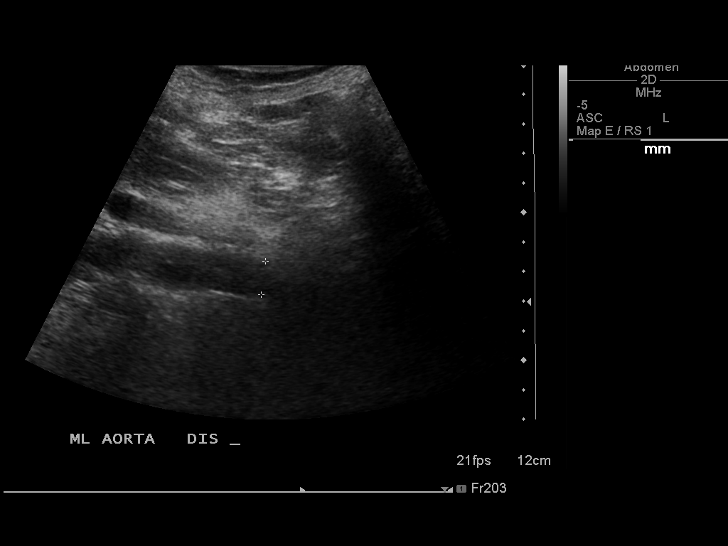
[im 60/72]
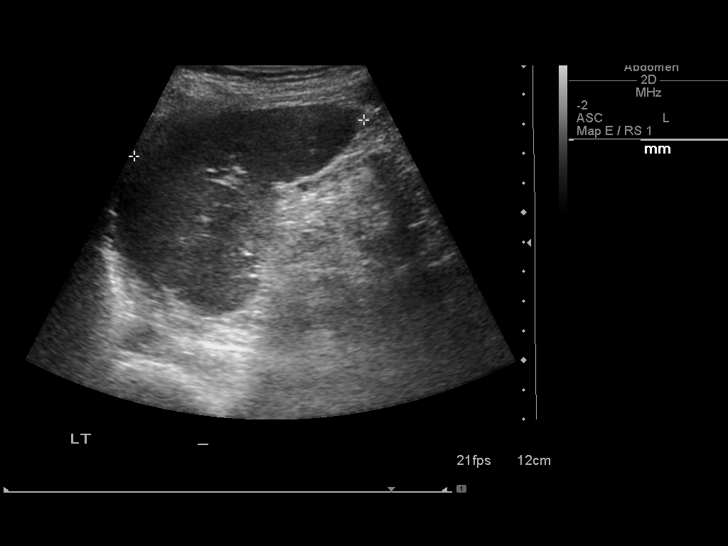
[im 66/72]
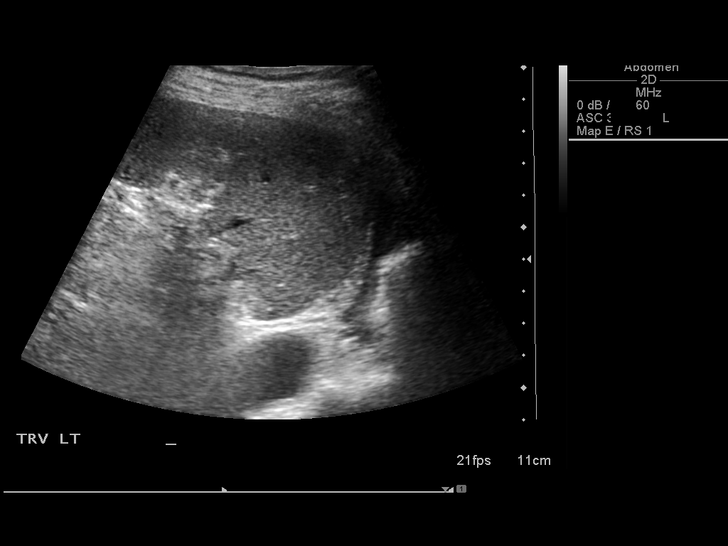
[im 72/72]
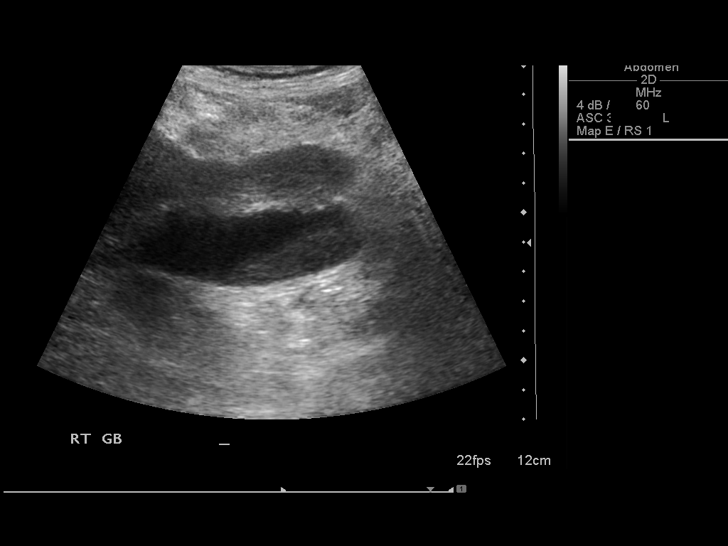

[14 of 25 positions shown; findings below may reference images not displayed]

FINDINGS: Gallbladder:  Gallbladder sludge.  No gallstones, gallbladder wall
thickening, or pericholecystic fluid.  Negative sonographic
Murphy's sign.

Common bile duct:  Measures 4 mm.

Liver:  7.8 x 3.7 x 6.7 cm complex subcapsular fluid
collection/hematoma overlying the right hepatic lobe.  1.6 x 1.7 x
1.7 cm hyperechoic lesion in the central right hepatic lobe,
unchanged from prior biopsy ultrasound.

IVC:  Appears normal.

Pancreas:  Incompletely visualized but grossly unremarkable.

Spleen:  Measures 7.4 cm.  Calcified splenic granulomata.

Right Kidney:  Measures 11.2 cm.  No mass or hydronephrosis.

Left Kidney:  Measures 10.6 cm.  No mass or hydronephrosis.

Abdominal aorta:  No aneurysm identified.
IMPRESSION: 7.8 cm complex subcapsular fluid collection/hematoma overlying the
right hepatic lobe.

1.7 cm hyperechoic lesion in the central right hepatic lobe,
unchanged from prior biopsy ultrasound.
